# Patient Record
Sex: Male | Born: 1960
Health system: Southern US, Community
[De-identification: ages and names within clinical notes are randomized; demographics above are authoritative.]

## PROBLEM LIST (undated history)

## (undated) DIAGNOSIS — I10 Essential (primary) hypertension: Secondary | ICD-10-CM

## (undated) DIAGNOSIS — T884XXA Failed or difficult intubation, initial encounter: Secondary | ICD-10-CM

## (undated) DIAGNOSIS — T8859XA Other complications of anesthesia, initial encounter: Secondary | ICD-10-CM

## (undated) DIAGNOSIS — E119 Type 2 diabetes mellitus without complications: Secondary | ICD-10-CM

## (undated) DIAGNOSIS — E785 Hyperlipidemia, unspecified: Secondary | ICD-10-CM

## (undated) DIAGNOSIS — G4730 Sleep apnea, unspecified: Secondary | ICD-10-CM

## (undated) DIAGNOSIS — C801 Malignant (primary) neoplasm, unspecified: Secondary | ICD-10-CM

## (undated) DIAGNOSIS — M19012 Primary osteoarthritis, left shoulder: Secondary | ICD-10-CM

## (undated) HISTORY — DX: Type 2 diabetes mellitus without complications: E11.9

## (undated) HISTORY — DX: Essential (primary) hypertension: I10

## (undated) HISTORY — PX: JOINT REPLACEMENT: SHX530

## (undated) HISTORY — PX: SKIN CANCER EXCISION: SHX779

## (undated) HISTORY — DX: Sleep apnea, unspecified: G47.30

## (undated) HISTORY — PX: HERNIA REPAIR: SHX51

## (undated) HISTORY — DX: Hyperlipidemia, unspecified: E78.5

---

## 1998-01-14 ENCOUNTER — Encounter: Payer: Self-pay | Admitting: Emergency Medicine

## 1998-01-14 ENCOUNTER — Emergency Department (HOSPITAL_COMMUNITY): Admission: EM | Admit: 1998-01-14 | Discharge: 1998-01-14 | Payer: Self-pay | Admitting: Emergency Medicine

## 2000-02-17 HISTORY — PX: SHOULDER ARTHROSCOPY: SHX128

## 2001-03-25 ENCOUNTER — Ambulatory Visit (HOSPITAL_COMMUNITY): Admission: RE | Admit: 2001-03-25 | Discharge: 2001-03-25 | Payer: Self-pay | Admitting: Orthopedic Surgery

## 2001-03-25 ENCOUNTER — Encounter: Payer: Self-pay | Admitting: Orthopedic Surgery

## 2002-01-20 ENCOUNTER — Ambulatory Visit (HOSPITAL_COMMUNITY): Admission: RE | Admit: 2002-01-20 | Discharge: 2002-01-20 | Payer: Self-pay | Admitting: Orthopedic Surgery

## 2002-01-25 ENCOUNTER — Observation Stay (HOSPITAL_COMMUNITY): Admission: RE | Admit: 2002-01-25 | Discharge: 2002-01-26 | Payer: Self-pay | Admitting: Orthopedic Surgery

## 2003-03-17 ENCOUNTER — Emergency Department (HOSPITAL_COMMUNITY): Admission: EM | Admit: 2003-03-17 | Discharge: 2003-03-17 | Payer: Self-pay | Admitting: Emergency Medicine

## 2004-12-09 ENCOUNTER — Ambulatory Visit (HOSPITAL_COMMUNITY): Admission: RE | Admit: 2004-12-09 | Discharge: 2004-12-09 | Payer: Self-pay | Admitting: Orthopedic Surgery

## 2005-01-15 ENCOUNTER — Encounter: Admission: RE | Admit: 2005-01-15 | Discharge: 2005-03-10 | Payer: Self-pay | Admitting: Orthopedic Surgery

## 2006-02-16 HISTORY — PX: KNEE ARTHROSCOPY: SHX127

## 2012-08-22 ENCOUNTER — Other Ambulatory Visit: Payer: Self-pay

## 2012-08-22 NOTE — Telephone Encounter (Signed)
lst seen 08/10/11  ACM

## 2012-08-23 MED ORDER — LISINOPRIL 20 MG PO TABS: 20.0000 mg | ORAL_TABLET | Freq: Every day | ORAL | Status: DC

## 2012-08-23 NOTE — Telephone Encounter (Signed)
Needs to be seen

## 2012-08-25 ENCOUNTER — Other Ambulatory Visit: Payer: Self-pay

## 2012-08-25 NOTE — Telephone Encounter (Signed)
Looks like you approved 15 pills on

## 2012-08-29 ENCOUNTER — Other Ambulatory Visit: Payer: Self-pay | Admitting: *Deleted

## 2012-08-29 MED ORDER — AMLODIPINE BESYLATE 10 MG PO TABS: 10.0000 mg | ORAL_TABLET | Freq: Every day | ORAL | Status: DC

## 2012-10-03 ENCOUNTER — Encounter: Payer: Self-pay | Admitting: Family Medicine

## 2012-10-03 ENCOUNTER — Ambulatory Visit (INDEPENDENT_AMBULATORY_CARE_PROVIDER_SITE_OTHER): Payer: Managed Care, Other (non HMO) | Admitting: Family Medicine

## 2012-10-03 VITALS — BP 163/96 | HR 92 | Temp 97.8°F | Wt 298.8 lb

## 2012-10-03 DIAGNOSIS — Z0000 Encounter for general adult medical examination without abnormal findings: Secondary | ICD-10-CM

## 2012-10-03 DIAGNOSIS — I10 Essential (primary) hypertension: Secondary | ICD-10-CM

## 2012-10-03 LAB — POCT CBC
Granulocyte percent: 64.9 %G (ref 37–80)
HCT, POC: 47.2 % (ref 43.5–53.7)
Hemoglobin: 16 g/dL (ref 14.1–18.1)
Lymph, poc: 2.9 (ref 0.6–3.4)
MCH, POC: 28.8 pg (ref 27–31.2)
MCHC: 33.9 g/dL (ref 31.8–35.4)
MCV: 84.9 fL (ref 80–97)
MPV: 8.6 fL (ref 0–99.8)
POC Granulocyte: 5.8 (ref 2–6.9)
POC LYMPH PERCENT: 31.9 %L (ref 10–50)
Platelet Count, POC: 262 10*3/uL (ref 142–424)
RBC: 5.6 M/uL (ref 4.69–6.13)
RDW, POC: 13.2 %
WBC: 9 10*3/uL (ref 4.6–10.2)

## 2012-10-03 MED ORDER — AMLODIPINE BESYLATE 10 MG PO TABS: 10.0000 mg | ORAL_TABLET | Freq: Every day | ORAL | Status: DC

## 2012-10-03 MED ORDER — LISINOPRIL 20 MG PO TABS: 20.0000 mg | ORAL_TABLET | Freq: Every day | ORAL | Status: DC

## 2012-10-03 NOTE — Patient Instructions (Signed)

## 2012-10-03 NOTE — Progress Notes (Signed)
  Subjective:    Patient ID: Gregory Carey, male    DOB: 04/20/1960, 52 y.o.   MRN: [id number]  HPI  This 52 y.o. male presents for evaluation of hypertension and annual exam.  He has been  Seen over a year and is due for labs.  He is not having any acute complaints.  He has been Out of bp medicine for a few days.  Review of Systems    No chest pain, SOB, HA, dizziness, vision change, N/V, diarrhea, constipation, dysuria, urinary urgency or frequency, myalgias, arthralgias or rash.  Objective:   Physical Exam Vital signs noted  Well developed well nourished male.  HEENT - Head atraumatic Normocephalic                Eyes - PERRLA, Conjuctiva - clear Sclera- Clear EOMI                Ears - EAC's Wnl TM's Wnl Gross Hearing WNL                Nose - Nares patent                 Throat - oropharanx wnl Respiratory - Lungs CTA bilateral Cardiac - RRR S1 and S2 without murmur GI - Abdomen soft Nontender and bowel sounds active x 4 Extremities - No edema. Neuro - Grossly intact.      Assessment & Plan:  Essential hypertension, benign - Plan: amLODipine (NORVASC) 10 MG tablet, lisinopril (PRINIVIL,ZESTRIL) 20 MG tablet  Routine general medical examination at a health care facility - Plan: POCT CBC, CMP14+EGFR, TSH, PSA, total and free  Follow up in 6 months

## 2012-10-04 LAB — PSA, TOTAL AND FREE
PSA, Free Pct: 27.5 %
PSA, Free: 0.11 ng/mL
PSA: 0.4 ng/mL (ref 0.0–4.0)

## 2012-10-04 LAB — CMP14+EGFR
ALT: 35 IU/L (ref 0–44)
AST: 28 IU/L (ref 0–40)
Albumin/Globulin Ratio: 1.9 (ref 1.1–2.5)
Albumin: 4.5 g/dL (ref 3.5–5.5)
Alkaline Phosphatase: 75 IU/L (ref 39–117)
BUN/Creatinine Ratio: 17 (ref 9–20)
BUN: 15 mg/dL (ref 6–24)
CO2: 20 mmol/L (ref 18–29)
Calcium: 9.5 mg/dL (ref 8.7–10.2)
Chloride: 104 mmol/L (ref 97–108)
Creatinine, Ser: 0.87 mg/dL (ref 0.76–1.27)
GFR calc Af Amer: 116 mL/min/{1.73_m2} (ref >59)
GFR calc non Af Amer: 100 mL/min/{1.73_m2} (ref >59)
Globulin, Total: 2.4 g/dL (ref 1.5–4.5)
Glucose: 133 mg/dL — ABNORMAL HIGH (ref 65–99)
Potassium: 4 mmol/L (ref 3.5–5.2)
Sodium: 142 mmol/L (ref 134–144)
Total Bilirubin: 0.4 mg/dL (ref 0.0–1.2)
Total Protein: 6.9 g/dL (ref 6.0–8.5)

## 2012-10-04 LAB — SPECIMEN STATUS REPORT

## 2012-10-04 LAB — TSH: TSH: 1.03 u[IU]/mL (ref 0.450–4.500)

## 2012-10-07 LAB — POCT GLYCOSYLATED HEMOGLOBIN (HGB A1C): Hemoglobin A1C: 5.7

## 2012-10-07 NOTE — Addendum Note (Signed)
Addended by: Lisbeth Ply C on: 10/07/2012 09:05 AM   Modules accepted: Orders

## 2013-01-25 ENCOUNTER — Ambulatory Visit (INDEPENDENT_AMBULATORY_CARE_PROVIDER_SITE_OTHER): Payer: Managed Care, Other (non HMO) | Admitting: Family Medicine

## 2013-01-25 ENCOUNTER — Encounter: Payer: Self-pay | Admitting: Family Medicine

## 2013-01-25 VITALS — BP 141/88 | HR 85 | Temp 99.0°F

## 2013-01-25 DIAGNOSIS — J209 Acute bronchitis, unspecified: Secondary | ICD-10-CM

## 2013-01-25 MED ORDER — AMOXICILLIN 875 MG PO TABS: 875.0000 mg | ORAL_TABLET | Freq: Two times a day (BID) | ORAL | Status: DC

## 2013-01-25 MED ORDER — METHYLPREDNISOLONE (PAK) 4 MG PO TABS: ORAL_TABLET | ORAL | Status: DC

## 2013-01-25 MED ORDER — ALBUTEROL SULFATE HFA 108 (90 BASE) MCG/ACT IN AERS: 2.0000 | INHALATION_SPRAY | Freq: Four times a day (QID) | RESPIRATORY_TRACT | Status: DC | PRN

## 2013-01-25 MED ORDER — HYDROCODONE-HOMATROPINE 5-1.5 MG/5ML PO SYRP: 5.0000 mL | ORAL_SOLUTION | Freq: Four times a day (QID) | ORAL | Status: DC | PRN

## 2013-01-25 NOTE — Patient Instructions (Signed)

## 2013-01-25 NOTE — Progress Notes (Signed)
   Subjective:    Patient ID: Gregory Carey, male    DOB: 11/05/1960, 52 y.o.   MRN: [id number]  HPI This 52 y.o. male presents for evaluation of cough and uri sx's for over a week. He is having persistent cough and cannot sleep at night.  Review of Systems C/o wheezing and URI sx's. No chest pain, SOB, HA, dizziness, vision change, N/V, diarrhea, constipation, dysuria, urinary urgency or frequency, myalgias, arthralgias or rash.     Objective:   Physical Exam  Vital signs noted  Well developed well nourished male.  HEENT - Head atraumatic Normocephalic                Eyes - PERRLA, Conjuctiva - clear Sclera- Clear EOMI                Ears - EAC's Wnl TM's Wnl Gross Hearing WNL                Nose - Nares patent                 Throat - oropharanx wnl Respiratory - Lungs diminished throughout Cardiac - RRR S1 and S2 without murmur GI - Abdomen soft Nontender and bowel sounds active x 4 Extremities - No edema. Neuro - Grossly intact.      Assessment & Plan:  Acute bronchitis - Plan: amoxicillin (AMOXIL) 875 MG tablet, methylPREDNIsolone (MEDROL DOSPACK) 4 MG tablet, HYDROcodone-homatropine (HYCODAN) 5-1.5 MG/5ML syrup, albuterol (PROVENTIL HFA;VENTOLIN HFA) 108 (90 BASE) MCG/ACT inhaler  Push po fluids, rest, tylenol and motrin otc prn as directed for fever, arthralgias, and myalgias.  Follow up prn if sx's continue or persist.  Deatra Canter FNP

## 2013-04-05 ENCOUNTER — Ambulatory Visit: Payer: BC Managed Care – PPO | Admitting: Family Medicine

## 2013-10-06 ENCOUNTER — Encounter: Payer: Self-pay | Admitting: Family

## 2013-10-06 ENCOUNTER — Other Ambulatory Visit: Payer: Self-pay | Admitting: Family Medicine

## 2013-10-06 ENCOUNTER — Ambulatory Visit (INDEPENDENT_AMBULATORY_CARE_PROVIDER_SITE_OTHER): Payer: BC Managed Care – PPO | Admitting: Family

## 2013-10-06 VITALS — BP 147/86 | HR 77 | Temp 97.1°F | Ht 71.0 in | Wt 300.8 lb

## 2013-10-06 DIAGNOSIS — J069 Acute upper respiratory infection, unspecified: Secondary | ICD-10-CM

## 2013-10-06 MED ORDER — AZITHROMYCIN 250 MG PO TABS: ORAL_TABLET | ORAL | Status: DC

## 2013-10-06 NOTE — Patient Instructions (Addendum)
Upper Respiratory Infection, Adult An upper respiratory infection (URI) is also known as the common cold. It is often caused by a type of germ (virus). Colds are easily spread (contagious). You can pass it to others by kissing, coughing, sneezing, or drinking out of the same glass. Usually, you get better in 1 or 2 weeks.  HOME CARE   Only take medicine as told by your doctor.  Use a warm mist humidifier or breathe in steam from a hot shower.  Drink enough water and fluids to keep your pee (urine) clear or pale yellow.  Get plenty of rest.  Return to work when your temperature is back to normal or as told by your doctor. You may use a face mask and wash your hands to stop your cold from spreading. GET HELP RIGHT AWAY IF:   After the first few days, you feel you are getting worse.  You have questions about your medicine.  You have chills, shortness of breath, or brown or red spit (mucus).  You have yellow or brown snot (nasal discharge) or pain in the face, especially when you bend forward.  You have a fever, puffy (swollen) neck, pain when you swallow, or white spots in the back of your throat.  You have a bad headache, ear pain, sinus pain, or chest pain.  You have a high-pitched whistling sound when you breathe in and out (wheezing).  You have a lasting cough or cough up blood.  You have sore muscles or a stiff neck. MAKE SURE YOU:   Understand these instructions.  Will watch your condition.  Will get help right away if you are not doing well or get worse. Document Released: 07/22/2007 Document Revised: 04/27/2011 Document Reviewed: 05/10/2013 [facility] Patient Information 2015 [place], [place]. This information is not intended to replace advice given to you by your health care provider. Make sure you discuss any questions you have with your health care provider.  - Take meds as prescribed - Use a cool mist humidifier  -Use saline nose sprays frequently -Saline  irrigations of the nose can be very helpful if done frequently.  * 4X daily for 1 week*  * Use of a nettie pot can be helpful with this. Follow directions with this* -Force fluids -For any cough or congestion  Use plain Mucinex- regular strength or max strength is fine   * Children- consult with Pharmacist for dosing -For fever or aces or pains- take tylenol or ibuprofen appropriate for age and weight.  * for fevers greater than 101 orally you may alternate ibuprofen and tylenol every  3 hours. -Throat lozenges if helps   Evelina Dun, FNP

## 2013-10-06 NOTE — Progress Notes (Signed)
Subjective:    Patient ID: Gregory Carey, male    DOB: 13-Dec-1960, 53 y.o.   MRN: [id number]  Sore Throat  This is a new problem. The current episode started in the past 7 days. The problem has been waxing and waning. Neither side of throat is experiencing more pain than the other. There has been no fever. The pain is at a severity of 2/10. The pain is mild. Associated symptoms include congestion, coughing, a hoarse voice, a plugged ear sensation, neck pain and trouble swallowing. Pertinent negatives include no abdominal pain, diarrhea, ear discharge, ear pain, headaches, shortness of breath or vomiting. He has had no exposure to strep. He has tried acetaminophen and NSAIDs for the symptoms. The treatment provided mild relief.      Review of Systems  Constitutional: Negative.   HENT: Positive for congestion, hoarse voice and trouble swallowing. Negative for ear discharge and ear pain.   Respiratory: Positive for cough. Negative for shortness of breath.   Cardiovascular: Negative.   Gastrointestinal: Negative.  Negative for vomiting, abdominal pain and diarrhea.  Endocrine: Negative.   Genitourinary: Negative.   Musculoskeletal: Positive for neck pain.  Neurological: Negative.  Negative for headaches.  Hematological: Negative.   Psychiatric/Behavioral: Negative.   All other systems reviewed and are negative.      Objective:   Physical Exam  Vitals reviewed. Constitutional: He is oriented to person, place, and time. He appears well-developed and well-nourished. No distress.  HENT:  Head: Normocephalic.  Right Ear: External ear normal.  Left Ear: External ear normal.  Nasal passage erythemas with mild swelling Oropharynx erythemas   Eyes: Pupils are equal, round, and reactive to light. Right eye exhibits no discharge. Left eye exhibits no discharge.  Neck: Normal range of motion. Neck supple. No thyromegaly present.  Cardiovascular: Normal rate, regular rhythm, normal heart  sounds and intact distal pulses.   No murmur heard. Pulmonary/Chest: Effort normal and breath sounds normal. No respiratory distress. He has no wheezes.  Abdominal: Soft. Bowel sounds are normal. He exhibits no distension. There is no tenderness.  Musculoskeletal: Normal range of motion. He exhibits no edema and no tenderness.  Neurological: He is alert and oriented to person, place, and time. He has normal reflexes. No cranial nerve deficit.  Skin: Skin is warm and dry. No rash noted. No erythema.  Psychiatric: He has a normal mood and affect. His behavior is normal. Judgment and thought content normal.      BP 147/86  Pulse 77  Temp(Src) 97.1 F (36.2 C) (Oral)  Ht 5\' 11"  (1.803 m)  Wt 300 lb 12.8 oz (136.442 kg)  BMI 41.97 kg/m2     Assessment & Plan:  1. URI (upper respiratory infection) -- Take meds as prescribed - Use a cool mist humidifier  -Use saline nose sprays frequently -Saline irrigations of the nose can be very helpful if done frequently.  * 4X daily for 1 week*  * Use of a nettie pot can be helpful with this. Follow directions with this* -Force fluids -For any cough or congestion  Use plain Mucinex- regular strength or max strength is fine   * Children- consult with Pharmacist for dosing -For fever or aces or pains- take tylenol or ibuprofen appropriate for age and weight.  * for fevers greater than 101 orally you may alternate ibuprofen and tylenol every  3 hours. -Throat lozenges if help - azithromycin (ZITHROMAX) 250 MG tablet; Take 500 mg first day and then 250 mg  for 4 days  Dispense: 6 tablet; Refill: 0  Evelina Dun, FNP

## 2013-10-09 NOTE — Telephone Encounter (Signed)
Last seen for URI 10/06/13, last seen for HTN 10/03/12

## 2014-01-16 ENCOUNTER — Telehealth: Payer: Self-pay | Admitting: Family Medicine

## 2014-01-16 MED ORDER — LISINOPRIL 20 MG PO TABS: 20.0000 mg | ORAL_TABLET | Freq: Every day | ORAL | Status: DC

## 2014-01-16 MED ORDER — AMLODIPINE BESYLATE 10 MG PO TABS: ORAL_TABLET | ORAL | Status: DC

## 2014-01-16 NOTE — Telephone Encounter (Signed)
Appointment given for o12/8 with Dietrich Pates, FNP

## 2014-01-23 ENCOUNTER — Ambulatory Visit (INDEPENDENT_AMBULATORY_CARE_PROVIDER_SITE_OTHER): Payer: BC Managed Care – PPO | Admitting: Family Medicine

## 2014-01-23 ENCOUNTER — Encounter: Payer: Self-pay | Admitting: Family Medicine

## 2014-01-23 VITALS — BP 156/91 | HR 81 | Temp 97.6°F | Ht 71.0 in | Wt 294.2 lb

## 2014-01-23 DIAGNOSIS — R5383 Other fatigue: Secondary | ICD-10-CM

## 2014-01-23 DIAGNOSIS — I10 Essential (primary) hypertension: Secondary | ICD-10-CM

## 2014-01-23 DIAGNOSIS — Z139 Encounter for screening, unspecified: Secondary | ICD-10-CM

## 2014-01-23 LAB — POCT CBC
Granulocyte percent: 66 %G (ref 37–80)
HCT, POC: 45.7 % (ref 43.5–53.7)
Hemoglobin: 15.4 g/dL (ref 14.1–18.1)
Lymph, poc: 2.4 (ref 0.6–3.4)
MCH, POC: 29.1 pg (ref 27–31.2)
MCHC: 33.8 g/dL (ref 31.8–35.4)
MCV: 86.3 fL (ref 80–97)
MPV: 8.5 fL (ref 0–99.8)
POC Granulocyte: 5.1 (ref 2–6.9)
POC LYMPH PERCENT: 31.5 %L (ref 10–50)
Platelet Count, POC: 261 10*3/uL (ref 142–424)
RBC: 5.3 M/uL (ref 4.69–6.13)
RDW, POC: 13.5 %
WBC: 7.7 10*3/uL (ref 4.6–10.2)

## 2014-01-23 MED ORDER — LISINOPRIL 20 MG PO TABS: 20.0000 mg | ORAL_TABLET | Freq: Every day | ORAL | Status: DC

## 2014-01-23 MED ORDER — AMLODIPINE BESYLATE 10 MG PO TABS: ORAL_TABLET | ORAL | Status: DC

## 2014-01-23 NOTE — Progress Notes (Signed)
Subjective:    Patient ID: Gregory Carey, male    DOB: 07/17/1960, 53 y.o.   MRN: [id number]  HPI Patient is here for follow up.  He has hx of hypertension.  He is due for CPE labs.  Review of Systems  Constitutional: Negative for fever.  HENT: Negative for ear pain.   Eyes: Negative for discharge.  Respiratory: Negative for cough.   Cardiovascular: Negative for chest pain.  Gastrointestinal: Negative for abdominal distention.  Endocrine: Negative for polyuria.  Genitourinary: Negative for difficulty urinating.  Musculoskeletal: Negative for gait problem and neck pain.  Skin: Negative for color change and rash.  Neurological: Negative for speech difficulty and headaches.  Psychiatric/Behavioral: Negative for agitation.       Objective:    BP 156/91 mmHg  Pulse 81  Temp(Src) 97.6 F (36.4 C) (Oral)  Ht 5' 11" (1.803 m)  Wt 294 lb 3.2 oz (133.448 kg)  BMI 41.05 kg/m2 Physical Exam  Constitutional: He is oriented to person, place, and time. He appears well-developed and well-nourished.  HENT:  Head: Normocephalic and atraumatic.  Mouth/Throat: Oropharynx is clear and moist.  Eyes: Pupils are equal, round, and reactive to light.  Neck: Normal range of motion. Neck supple.  Cardiovascular: Normal rate and regular rhythm.   No murmur heard. Pulmonary/Chest: Effort normal and breath sounds normal.  Abdominal: Soft. Bowel sounds are normal. There is no tenderness.  Neurological: He is alert and oriented to person, place, and time.  Skin: Skin is warm and dry.  Psychiatric: He has a normal mood and affect.          Assessment & Plan:     ICD-9-CM ICD-10-CM   1. Screening V82.9 Z13.9 Lipid panel     PSA, total and free     Thyroid Panel With TSH     lisinopril (PRINIVIL,ZESTRIL) 20 MG tablet     amLODipine (NORVASC) 10 MG tablet  2. Other fatigue 780.79 R53.83 POCT CBC     PSA, total and free     Thyroid Panel With TSH     lisinopril (PRINIVIL,ZESTRIL) 20 MG  tablet     amLODipine (NORVASC) 10 MG tablet  3. Essential hypertension, benign 401.1 I10 POCT CBC     CMP14+EGFR     Lipid panel     lisinopril (PRINIVIL,ZESTRIL) 20 MG tablet     amLODipine (NORVASC) 10 MG tablet     No Follow-up on file.  William J Oxford FNP  

## 2014-01-24 ENCOUNTER — Telehealth: Payer: Self-pay

## 2014-01-24 ENCOUNTER — Other Ambulatory Visit: Payer: Self-pay | Admitting: Family Medicine

## 2014-01-24 LAB — CMP14+EGFR
ALT: 39 IU/L (ref 0–44)
AST: 27 IU/L (ref 0–40)
Albumin/Globulin Ratio: 1.9 (ref 1.1–2.5)
Albumin: 4.4 g/dL (ref 3.5–5.5)
Alkaline Phosphatase: 79 IU/L (ref 39–117)
BUN/Creatinine Ratio: 11 (ref 9–20)
BUN: 10 mg/dL (ref 6–24)
CO2: 24 mmol/L (ref 18–29)
Calcium: 9.3 mg/dL (ref 8.7–10.2)
Chloride: 100 mmol/L (ref 97–108)
Creatinine, Ser: 0.88 mg/dL (ref 0.76–1.27)
GFR calc Af Amer: 114 mL/min/{1.73_m2} (ref >59)
GFR calc non Af Amer: 99 mL/min/{1.73_m2} (ref >59)
Globulin, Total: 2.3 g/dL (ref 1.5–4.5)
Glucose: 107 mg/dL — ABNORMAL HIGH (ref 65–99)
Potassium: 4.1 mmol/L (ref 3.5–5.2)
Sodium: 140 mmol/L (ref 134–144)
Total Bilirubin: 0.6 mg/dL (ref 0.0–1.2)
Total Protein: 6.7 g/dL (ref 6.0–8.5)

## 2014-01-24 LAB — LIPID PANEL
Chol/HDL Ratio: 5.8 ratio units — ABNORMAL HIGH (ref 0.0–5.0)
Cholesterol, Total: 186 mg/dL (ref 100–199)
HDL: 32 mg/dL — ABNORMAL LOW (ref >39)
LDL Calculated: 123 mg/dL — ABNORMAL HIGH (ref 0–99)
Triglycerides: 157 mg/dL — ABNORMAL HIGH (ref 0–149)
VLDL Cholesterol Cal: 31 mg/dL (ref 5–40)

## 2014-01-24 LAB — PSA, TOTAL AND FREE
PSA, Free Pct: 24 %
PSA, Free: 0.12 ng/mL
PSA: 0.5 ng/mL (ref 0.0–4.0)

## 2014-01-24 LAB — THYROID PANEL WITH TSH
Free Thyroxine Index: 1.8 (ref 1.2–4.9)
T3 Uptake Ratio: 32 % (ref 24–39)
T4, Total: 5.7 ug/dL (ref 4.5–12.0)
TSH: 0.705 u[IU]/mL (ref 0.450–4.500)

## 2014-01-24 NOTE — Telephone Encounter (Signed)
-----   Message from Lysbeth Penner, FNP sent at 01/24/2014  9:59 AM EST ----- Lipids elevated and take omega 3 fish oil otc

## 2014-01-24 NOTE — Telephone Encounter (Signed)
Left results on home am as per DPR of 09/2012

## 2014-02-22 ENCOUNTER — Telehealth: Payer: Self-pay | Admitting: Family Medicine

## 2014-02-22 NOTE — Telephone Encounter (Signed)
Pt notified RXs sent into [facility]

## 2014-09-25 ENCOUNTER — Encounter: Payer: Self-pay | Admitting: Physician Assistant

## 2014-09-25 ENCOUNTER — Ambulatory Visit (INDEPENDENT_AMBULATORY_CARE_PROVIDER_SITE_OTHER): Payer: BLUE CROSS/BLUE SHIELD | Admitting: Physician Assistant

## 2014-09-25 ENCOUNTER — Ambulatory Visit (INDEPENDENT_AMBULATORY_CARE_PROVIDER_SITE_OTHER): Payer: BLUE CROSS/BLUE SHIELD

## 2014-09-25 VITALS — BP 148/91 | HR 84 | Temp 98.3°F | Ht 71.0 in | Wt 294.0 lb

## 2014-09-25 DIAGNOSIS — M25511 Pain in right shoulder: Secondary | ICD-10-CM | POA: Diagnosis not present

## 2014-09-25 MED ORDER — PREDNISONE 10 MG (21) PO TBPK: ORAL_TABLET | ORAL | Status: DC

## 2014-09-25 MED ORDER — HYDROCODONE-ACETAMINOPHEN 5-325 MG PO TABS: 1.0000 | ORAL_TABLET | Freq: Four times a day (QID) | ORAL | Status: DC | PRN

## 2014-09-25 MED ORDER — MELOXICAM 15 MG PO TABS: 15.0000 mg | ORAL_TABLET | Freq: Every day | ORAL | Status: DC

## 2014-09-25 NOTE — Progress Notes (Signed)
   Subjective:    Patient ID: Gregory Carey, male    DOB: [birth year], 54 y.o.   MRN: [id number]  HPI 17 y/lo male presents with right shoulder pain x 1 week. Waking him up at night. Denies trauma but has had a dislocation 15 years ago. [place]. Reaches a lot during his job. Had arthroscopy on right shoulder in 18 years.   Pain started gradually, worsening. Has tried Aleve ( 2 pills q 6-8 hours) Aspercream, hot and cold with no relief. Worse with lying down in the evening hours. Pain radiates from shoulder to forearm. Denies numbness or tingling   Also states that he has had problems with it over the years off and on.     Review of Systems  Musculoskeletal: Positive for arthralgias (right shoulder, constant but intensity varies ).  Neurological: Negative.   All other systems reviewed and are negative.      Objective:   Physical Exam  Constitutional: He is oriented to person, place, and time. He appears well-developed and well-nourished. No distress.  Musculoskeletal: He exhibits tenderness. He exhibits no edema.  Neurological: He is alert and oriented to person, place, and time. No cranial nerve deficit. Coordination normal.  Skin: He is not diaphoretic. No erythema.  Psychiatric: He has a normal mood and affect. His behavior is normal. Judgment and thought content normal.  Vitals reviewed.         Assessment & Plan:  1. Pain in joint, shoulder region, right  - DG Shoulder Right; Future - predniSONE (STERAPRED UNI-PAK 21 TAB) 10 MG (21) TBPK tablet; 6 pills PO on day 1, 5 on day 2, 4 on day 3, 3 on day 4, 2 on day 5, 1 on day 6  Dispense: 21 tablet; Refill: 0 - meloxicam (MOBIC) 15 MG tablet; Take 1 tablet (15 mg total) by mouth daily.  Dispense: 30 tablet; Refill: 0 - HYDROcodone-acetaminophen (NORCO) 5-325 MG per tablet; Take 1-2 tablets by mouth every 6 (six) hours as needed for moderate pain.  Dispense: 90 tablet; Refill: 0 -  Ambulatory referral to Orthopedic Surgery     Mckenlee Mangham A. Benjamin Stain PA-C

## 2014-09-25 NOTE — Patient Instructions (Signed)
Shoulder Pain  The shoulder is the joint that connects your arm to your body. Muscles and band-like tissues that connect bones to muscles (tendons) hold the joint together. Shoulder pain is felt if an injury or medical problem affects one or more parts of the shoulder.  HOME CARE   · Put ice on the sore area.  ¨ Put ice in a plastic bag.  ¨ Place a towel between your skin and the bag.  ¨ Leave the ice on for 15-20 minutes, 03-04 times a day for the first 2 days.  · Stop using cold packs if they do not help with the pain.  · If you were given something to keep your shoulder from moving (sling; shoulder immobilizer), wear it as told. Only take it off to shower or bathe.  · Move your arm as little as possible, but keep your hand moving to prevent puffiness (swelling).  · Squeeze a soft ball or foam pad as much as possible to help prevent swelling.  · Take medicine as told by your doctor.  GET HELP IF:  · You have progressing new pain in your arm, hand, or fingers.  · Your hand or fingers get cold.  · Your medicine does not help lessen your pain.  GET HELP RIGHT AWAY IF:   · Your arm, hand, or fingers are numb or tingling.  · Your arm, hand, or fingers are puffy (swollen), painful, or turn white or blue.  MAKE SURE YOU:   · Understand these instructions.  · Will watch your condition.  · Will get help right away if you are not doing well or get worse.  Document Released: 07/22/2007 Document Revised: 06/19/2013 Document Reviewed: 08/17/2011  [employer]® Patient Information ©2015 [employer]. This information is not intended to replace advice given to you by your health care provider. Make sure you discuss any questions you have with your health care provider.

## 2014-09-26 ENCOUNTER — Ambulatory Visit: Payer: Self-pay | Admitting: Physician Assistant

## 2015-01-25 ENCOUNTER — Other Ambulatory Visit: Payer: Self-pay

## 2015-01-25 DIAGNOSIS — Z139 Encounter for screening, unspecified: Secondary | ICD-10-CM

## 2015-01-25 DIAGNOSIS — R5383 Other fatigue: Secondary | ICD-10-CM

## 2015-01-25 DIAGNOSIS — I10 Essential (primary) hypertension: Secondary | ICD-10-CM

## 2015-01-25 MED ORDER — LISINOPRIL 20 MG PO TABS: 20.0000 mg | ORAL_TABLET | Freq: Every day | ORAL | Status: DC

## 2015-01-25 MED ORDER — AMLODIPINE BESYLATE 10 MG PO TABS: ORAL_TABLET | ORAL | Status: DC

## 2015-04-18 ENCOUNTER — Other Ambulatory Visit: Payer: Self-pay | Admitting: Family Medicine

## 2015-04-18 NOTE — Telephone Encounter (Signed)
Pt Patient NTBS for follow up and lab work

## 2015-04-18 NOTE — Telephone Encounter (Signed)
Last seen 09/25/14 [facility] Dense PCP

## 2015-04-18 NOTE — Telephone Encounter (Signed)
Pt has appt already scheduled for next week

## 2015-04-29 ENCOUNTER — Encounter: Payer: Self-pay | Admitting: Family

## 2015-04-29 ENCOUNTER — Ambulatory Visit (INDEPENDENT_AMBULATORY_CARE_PROVIDER_SITE_OTHER): Payer: BLUE CROSS/BLUE SHIELD | Admitting: Family

## 2015-04-29 VITALS — BP 133/80 | HR 95 | Temp 97.8°F | Ht 71.0 in | Wt 300.0 lb

## 2015-04-29 DIAGNOSIS — I10 Essential (primary) hypertension: Secondary | ICD-10-CM | POA: Diagnosis not present

## 2015-04-29 DIAGNOSIS — E669 Obesity, unspecified: Secondary | ICD-10-CM | POA: Insufficient documentation

## 2015-04-29 DIAGNOSIS — Z6841 Body Mass Index (BMI) 40.0 and over, adult: Secondary | ICD-10-CM | POA: Diagnosis not present

## 2015-04-29 DIAGNOSIS — E785 Hyperlipidemia, unspecified: Secondary | ICD-10-CM

## 2015-04-29 DIAGNOSIS — Z1211 Encounter for screening for malignant neoplasm of colon: Secondary | ICD-10-CM

## 2015-04-29 DIAGNOSIS — Z125 Encounter for screening for malignant neoplasm of prostate: Secondary | ICD-10-CM | POA: Diagnosis not present

## 2015-04-29 DIAGNOSIS — Z23 Encounter for immunization: Secondary | ICD-10-CM

## 2015-04-29 DIAGNOSIS — Z1159 Encounter for screening for other viral diseases: Secondary | ICD-10-CM | POA: Diagnosis not present

## 2015-04-29 DIAGNOSIS — Z114 Encounter for screening for human immunodeficiency virus [HIV]: Secondary | ICD-10-CM

## 2015-04-29 MED ORDER — LISINOPRIL 20 MG PO TABS: 20.0000 mg | ORAL_TABLET | Freq: Every day | ORAL | Status: DC

## 2015-04-29 MED ORDER — AMLODIPINE BESYLATE 10 MG PO TABS: 10.0000 mg | ORAL_TABLET | Freq: Every day | ORAL | Status: DC

## 2015-04-29 NOTE — Addendum Note (Signed)
Addended by: Shelbie Ammons on: 04/29/2015 04:36 PM   Modules accepted: Orders

## 2015-04-29 NOTE — Progress Notes (Signed)
Subjective:    Patient ID: Gregory Carey, male    DOB: January 09, 1961, 55 y.o.   MRN: [id number]  Diarrhea  Pertinent negatives include no headaches.  Hypertension This is a chronic problem. The current episode started more than 1 year ago. The problem has been resolved since onset. The problem is controlled. Pertinent negatives include no anxiety, blurred vision, headaches, palpitations, peripheral edema or shortness of breath. Risk factors for coronary artery disease include dyslipidemia, male gender, obesity and sedentary lifestyle. Past treatments include ACE inhibitors and calcium channel blockers. The current treatment provides moderate improvement. There is no history of kidney disease, CAD/MI, heart failure or a thyroid problem. There is no history of sleep apnea.  Hyperlipidemia This is a chronic problem. The current episode started more than 1 year ago. The problem is uncontrolled. Recent lipid tests were reviewed and are high. Factors aggravating his hyperlipidemia include fatty foods. Pertinent negatives include no shortness of breath. Current antihyperlipidemic treatment includes diet change and herbal therapy. The current treatment provides moderate improvement of lipids. Risk factors for coronary artery disease include dyslipidemia, hypertension, male sex, obesity and a sedentary lifestyle.      Review of Systems  Constitutional: Negative.   HENT: Negative.   Eyes: Negative for blurred vision.  Respiratory: Negative.  Negative for shortness of breath.   Cardiovascular: Negative.  Negative for palpitations.  Gastrointestinal: Positive for diarrhea.  Endocrine: Negative.   Genitourinary: Negative.   Musculoskeletal: Negative.   Neurological: Negative.  Negative for headaches.  Hematological: Negative.   Psychiatric/Behavioral: Negative.   All other systems reviewed and are negative.      Objective:   Physical Exam  Constitutional: He is oriented to person, place, and  time. He appears well-developed and well-nourished. No distress.  Morbid obese   HENT:  Head: Normocephalic.  Right Ear: External ear normal.  Left Ear: External ear normal.  Mouth/Throat: Oropharynx is clear and moist.  Eyes: Pupils are equal, round, and reactive to light. Right eye exhibits no discharge. Left eye exhibits no discharge.  Neck: Normal range of motion. Neck supple. No thyromegaly present.  Cardiovascular: Normal rate, regular rhythm, normal heart sounds and intact distal pulses.   No murmur heard. Pulmonary/Chest: Effort normal and breath sounds normal. No respiratory distress. He has no wheezes.  Abdominal: Soft. Bowel sounds are normal. He exhibits no distension. There is no tenderness.  Musculoskeletal: Normal range of motion. He exhibits no edema or tenderness.  Neurological: He is alert and oriented to person, place, and time. He has normal reflexes. No cranial nerve deficit.  Skin: Skin is warm and dry. No rash noted. No erythema.  Psychiatric: He has a normal mood and affect. His behavior is normal. Judgment and thought content normal.  Vitals reviewed.   BP 133/80 mmHg  Pulse 95  Temp(Src) 97.8 F (36.6 C) (Oral)  Ht 5' 11"  (1.803 m)  Wt 300 lb (136.079 kg)  BMI 41.86 kg/m2       Assessment & Plan:  1. Essential hypertension - CMP14+EGFR - lisinopril (PRINIVIL,ZESTRIL) 20 MG tablet; Take 1 tablet (20 mg total) by mouth daily.  Dispense: 90 tablet; Refill: 1 - amLODipine (NORVASC) 10 MG tablet; Take 1 tablet (10 mg total) by mouth daily.  Dispense: 90 tablet; Refill: 1  2. Hyperlipidemia - CMP14+EGFR - Lipid panel  3. Morbid obesity with BMI of 40.0-44.9, adult ([place]) - CMP14+EGFR  4. Need for hepatitis C screening test - CMP14+EGFR - Hepatitis C antibody  5. Encounter  for screening for HIV - CMP14+EGFR - HIV antibody  6. Colon cancer screening - CMP14+EGFR - Fecal occult blood, imunochemical; Future  7. Prostate cancer screening -  CMP14+EGFR - PSA, total and free   Continue all meds Labs pending Health Maintenance reviewed- TDAP given today Diet and exercise encouraged RTO 6 months  Evelina Dun, FNP

## 2015-04-29 NOTE — Patient Instructions (Signed)

## 2015-04-30 LAB — LIPID PANEL
CHOL/HDL RATIO: 6.3 ratio — AB (ref 0.0–5.0)
Cholesterol, Total: 169 mg/dL (ref 100–199)
HDL: 27 mg/dL — AB (ref >39)
LDL Calculated: 95 mg/dL (ref 0–99)
Triglycerides: 236 mg/dL — ABNORMAL HIGH (ref 0–149)
VLDL Cholesterol Cal: 47 mg/dL — ABNORMAL HIGH (ref 5–40)

## 2015-04-30 LAB — CMP14+EGFR
A/G RATIO: 2.3 — AB (ref 1.2–2.2)
ALBUMIN: 4.5 g/dL (ref 3.5–5.5)
ALT: 97 IU/L — ABNORMAL HIGH (ref 0–44)
AST: 46 IU/L — ABNORMAL HIGH (ref 0–40)
Alkaline Phosphatase: 99 IU/L (ref 39–117)
BUN/Creatinine Ratio: 16 (ref 9–20)
BUN: 20 mg/dL (ref 6–24)
Bilirubin Total: 0.6 mg/dL (ref 0.0–1.2)
CALCIUM: 9.7 mg/dL (ref 8.7–10.2)
CO2: 24 mmol/L (ref 18–29)
Chloride: 97 mmol/L (ref 96–106)
Creatinine, Ser: 1.28 mg/dL — ABNORMAL HIGH (ref 0.76–1.27)
GFR, EST AFRICAN AMERICAN: 73 mL/min/{1.73_m2} (ref >59)
GFR, EST NON AFRICAN AMERICAN: 63 mL/min/{1.73_m2} (ref >59)
GLOBULIN, TOTAL: 2 g/dL (ref 1.5–4.5)
Glucose: 180 mg/dL — ABNORMAL HIGH (ref 65–99)
POTASSIUM: 4.6 mmol/L (ref 3.5–5.2)
SODIUM: 140 mmol/L (ref 134–144)
TOTAL PROTEIN: 6.5 g/dL (ref 6.0–8.5)

## 2015-04-30 LAB — PSA, TOTAL AND FREE
PROSTATE SPECIFIC AG, SERUM: 0.4 ng/mL (ref 0.0–4.0)
PSA, Free Pct: 27.5 %
PSA, Free: 0.11 ng/mL

## 2015-04-30 LAB — HIV ANTIBODY (ROUTINE TESTING W REFLEX): HIV Screen 4th Generation wRfx: NONREACTIVE

## 2015-04-30 LAB — HEPATITIS C ANTIBODY: HEP C VIRUS AB: 0.2 {s_co_ratio} (ref 0.0–0.9)

## 2015-07-18 DIAGNOSIS — Z85828 Personal history of other malignant neoplasm of skin: Secondary | ICD-10-CM | POA: Diagnosis not present

## 2015-07-18 DIAGNOSIS — L821 Other seborrheic keratosis: Secondary | ICD-10-CM | POA: Diagnosis not present

## 2015-07-18 DIAGNOSIS — L570 Actinic keratosis: Secondary | ICD-10-CM | POA: Diagnosis not present

## 2015-07-18 DIAGNOSIS — D1801 Hemangioma of skin and subcutaneous tissue: Secondary | ICD-10-CM | POA: Diagnosis not present

## 2015-07-18 DIAGNOSIS — L812 Freckles: Secondary | ICD-10-CM | POA: Diagnosis not present

## 2015-07-18 DIAGNOSIS — C44612 Basal cell carcinoma of skin of right upper limb, including shoulder: Secondary | ICD-10-CM | POA: Diagnosis not present

## 2015-07-18 DIAGNOSIS — D485 Neoplasm of uncertain behavior of skin: Secondary | ICD-10-CM | POA: Diagnosis not present

## 2015-07-29 ENCOUNTER — Encounter: Payer: Self-pay | Admitting: Family

## 2015-07-29 ENCOUNTER — Ambulatory Visit (INDEPENDENT_AMBULATORY_CARE_PROVIDER_SITE_OTHER): Payer: BLUE CROSS/BLUE SHIELD | Admitting: Family

## 2015-07-29 VITALS — BP 131/90 | HR 88 | Temp 98.4°F | Ht 71.0 in | Wt 266.6 lb

## 2015-07-29 DIAGNOSIS — E1165 Type 2 diabetes mellitus with hyperglycemia: Secondary | ICD-10-CM | POA: Diagnosis not present

## 2015-07-29 DIAGNOSIS — Z6841 Body Mass Index (BMI) 40.0 and over, adult: Secondary | ICD-10-CM | POA: Diagnosis not present

## 2015-07-29 DIAGNOSIS — E785 Hyperlipidemia, unspecified: Secondary | ICD-10-CM

## 2015-07-29 DIAGNOSIS — I10 Essential (primary) hypertension: Secondary | ICD-10-CM

## 2015-07-29 DIAGNOSIS — Z1211 Encounter for screening for malignant neoplasm of colon: Secondary | ICD-10-CM

## 2015-07-29 DIAGNOSIS — E119 Type 2 diabetes mellitus without complications: Secondary | ICD-10-CM | POA: Insufficient documentation

## 2015-07-29 LAB — BAYER DCA HB A1C WAIVED

## 2015-07-29 MED ORDER — BLOOD GLUCOSE METER KIT: PACK | Status: AC

## 2015-07-29 MED ORDER — METFORMIN HCL 1000 MG PO TABS: 1000.0000 mg | ORAL_TABLET | Freq: Two times a day (BID) | ORAL | Status: DC

## 2015-07-29 MED ORDER — GLUCOSE BLOOD VI STRP
ORAL_STRIP | Status: DC
Start: 2015-07-29 — End: 2015-09-09

## 2015-07-29 NOTE — Progress Notes (Signed)
Subjective:    Patient ID: Gregory Carey, male    DOB: 1960-08-23, 55 y.o.   MRN: [id number]  Hyperglycemia This is a new problem. The current episode started more than 1 month ago. The problem occurs constantly. The problem has been rapidly worsening. Associated symptoms include fatigue. Pertinent negatives include no chills, coughing, headaches, nausea, sore throat, urinary symptoms or visual change. He has tried rest for the symptoms. The treatment provided mild relief.  Hypertension This is a chronic problem. The current episode started more than 1 year ago. The problem has been resolved since onset. The problem is controlled. Pertinent negatives include no anxiety, blurred vision, headaches or peripheral edema. Risk factors for coronary artery disease include dyslipidemia, male gender, obesity and sedentary lifestyle. Past treatments include ACE inhibitors and calcium channel blockers. The current treatment provides moderate improvement. There is no history of kidney disease, CAD/MI, CVA, heart failure or a thyroid problem. There is no history of sleep apnea.  Hyperlipidemia This is a chronic problem. The current episode started more than 1 year ago. The problem is uncontrolled. Recent lipid tests were reviewed and are high. Factors aggravating his hyperlipidemia include fatty foods. Current antihyperlipidemic treatment includes diet change and herbal therapy. The current treatment provides moderate improvement of lipids. Risk factors for coronary artery disease include dyslipidemia, hypertension, male sex, obesity and a sedentary lifestyle.  Diabetes He presents for his initial diabetic visit. He has type 2 diabetes mellitus. His disease course has been worsening. Hypoglycemia symptoms include nervousness/anxiousness. Pertinent negatives for hypoglycemia include no confusion, dizziness or headaches. Associated symptoms include fatigue. Pertinent negatives for diabetes include no blurred vision,  no foot paresthesias, no foot ulcerations and no visual change. Symptoms are worsening. Pertinent negatives for diabetic complications include no CVA, heart disease, nephropathy or peripheral neuropathy. Risk factors for coronary artery disease include diabetes mellitus, dyslipidemia, male sex, obesity, hypertension and family history. When asked about current treatments, none were reported. He is following a generally healthy diet. His breakfast blood glucose range is generally >200 mg/dl. An ACE inhibitor/angiotensin II receptor blocker is being taken. Eye exam is not current.      Review of Systems  Constitutional: Positive for fatigue. Negative for chills.  HENT: Negative.  Negative for sore throat.   Eyes: Negative for blurred vision.  Respiratory: Negative.  Negative for cough.   Cardiovascular: Negative.   Gastrointestinal: Negative for nausea.  Endocrine: Negative.   Genitourinary: Negative.   Musculoskeletal: Negative.   Neurological: Negative.  Negative for dizziness and headaches.  Hematological: Negative.   Psychiatric/Behavioral: Negative for confusion. The patient is nervous/anxious.   All other systems reviewed and are negative.      Objective:   Physical Exam  Constitutional: He is oriented to person, place, and time. He appears well-developed and well-nourished. No distress.  Morbid obese   HENT:  Head: Normocephalic.  Right Ear: External ear normal.  Left Ear: External ear normal.  Mouth/Throat: Oropharynx is clear and moist.  Eyes: Pupils are equal, round, and reactive to light. Right eye exhibits no discharge. Left eye exhibits no discharge.  Neck: Normal range of motion. Neck supple. No thyromegaly present.  Cardiovascular: Normal rate, regular rhythm, normal heart sounds and intact distal pulses.   No murmur heard. Pulmonary/Chest: Effort normal and breath sounds normal. No respiratory distress. He has no wheezes.  Abdominal: Soft. Bowel sounds are normal.  He exhibits no distension. There is no tenderness.  Musculoskeletal: Normal range of motion. He exhibits no  edema or tenderness.  Neurological: He is alert and oriented to person, place, and time. He has normal reflexes. No cranial nerve deficit.  Skin: Skin is warm and dry. No rash noted. No erythema.  Psychiatric: He has a normal mood and affect. His behavior is normal. Judgment and thought content normal.  Vitals reviewed.   BP 131/90 mmHg  Pulse 88  Temp(Src) 98.4 F (36.9 C) (Oral)  Ht 5' 11"  (1.803 m)  Wt 266 lb 9.6 oz (120.929 kg)  BMI 37.20 kg/m2       Assessment & Plan:  1. Essential hypertension -PT told to continue lisinopril and pt can stop Norvasc  - CMP14+EGFR  2. Hyperlipidemia - CMP14+EGFR - Lipid panel  3. Morbid obesity with BMI of 40.0-44.9, adult ([place]) - CMP14+EGFR  4. Type 2 diabetes mellitus with hyperglycemia, without long-term current use of insulin (HCC) PT started on metformin today -Long discussion about low carb diet -Given glucose meter today -Pt to make appt with Tammy - Bayer DCA Hb A1c Waived - CMP14+EGFR - Microalbumin / creatinine urine ratio - Ambulatory referral to Ophthalmology - metFORMIN (GLUCOPHAGE) 1000 MG tablet; Take 1 tablet (1,000 mg total) by mouth 2 (two) times daily with a meal.  Dispense: 180 tablet; Refill: 1 - glucose blood (GLUCOSE METER TEST) test strip; Use BID  Dispense: 100 each; Refill: 12 - blood glucose meter kit and supplies; Dispense based on patient and insurance preference. Use up to four times daily as directed. (FOR ICD-9 250.00, 250.01).  Dispense: 1 each; Refill: 0  5. Colon cancer screening - Fecal occult blood, imunochemical; Future   Continue all meds Labs pending Health Maintenance reviewed Diet and exercise encouraged RTO 1 month  Evelina Dun, FNP

## 2015-07-29 NOTE — Patient Instructions (Signed)

## 2015-07-30 ENCOUNTER — Other Ambulatory Visit: Payer: Self-pay | Admitting: Family

## 2015-07-30 LAB — CMP14+EGFR
A/G RATIO: 2 (ref 1.2–2.2)
ALBUMIN: 4.5 g/dL (ref 3.5–5.5)
ALT: 49 IU/L — ABNORMAL HIGH (ref 0–44)
AST: 33 IU/L (ref 0–40)
Alkaline Phosphatase: 113 IU/L (ref 39–117)
BILIRUBIN TOTAL: 0.8 mg/dL (ref 0.0–1.2)
BUN / CREAT RATIO: 16 (ref 9–20)
BUN: 18 mg/dL (ref 6–24)
CALCIUM: 9.8 mg/dL (ref 8.7–10.2)
CHLORIDE: 92 mmol/L — AB (ref 96–106)
CO2: 19 mmol/L (ref 18–29)
Creatinine, Ser: 1.11 mg/dL (ref 0.76–1.27)
GFR, EST AFRICAN AMERICAN: 87 mL/min/{1.73_m2} (ref >59)
GFR, EST NON AFRICAN AMERICAN: 75 mL/min/{1.73_m2} (ref >59)
GLOBULIN, TOTAL: 2.3 g/dL (ref 1.5–4.5)
Glucose: 460 mg/dL (ref 65–99)
POTASSIUM: 6.1 mmol/L — AB (ref 3.5–5.2)
SODIUM: 135 mmol/L (ref 134–144)
TOTAL PROTEIN: 6.8 g/dL (ref 6.0–8.5)

## 2015-07-30 LAB — LIPID PANEL
Chol/HDL Ratio: 7.9 ratio units — ABNORMAL HIGH (ref 0.0–5.0)
Cholesterol, Total: 212 mg/dL — ABNORMAL HIGH (ref 100–199)
HDL: 27 mg/dL — ABNORMAL LOW (ref >39)
Triglycerides: 796 mg/dL (ref 0–149)

## 2015-07-30 LAB — MICROALBUMIN / CREATININE URINE RATIO
CREATININE, UR: 46.1 mg/dL
MICROALB/CREAT RATIO: 24.5 mg/g{creat} (ref 0.0–30.0)
MICROALBUM., U, RANDOM: 11.3 ug/mL

## 2015-07-30 MED ORDER — ROSUVASTATIN CALCIUM 20 MG PO TABS: 20.0000 mg | ORAL_TABLET | Freq: Every day | ORAL | Status: DC

## 2015-07-31 ENCOUNTER — Telehealth: Payer: Self-pay | Admitting: Family

## 2015-07-31 DIAGNOSIS — I10 Essential (primary) hypertension: Secondary | ICD-10-CM | POA: Diagnosis not present

## 2015-07-31 DIAGNOSIS — Z79899 Other long term (current) drug therapy: Secondary | ICD-10-CM | POA: Diagnosis not present

## 2015-07-31 DIAGNOSIS — Z7984 Long term (current) use of oral hypoglycemic drugs: Secondary | ICD-10-CM | POA: Diagnosis not present

## 2015-07-31 DIAGNOSIS — R918 Other nonspecific abnormal finding of lung field: Secondary | ICD-10-CM | POA: Diagnosis not present

## 2015-07-31 DIAGNOSIS — R0602 Shortness of breath: Secondary | ICD-10-CM | POA: Diagnosis not present

## 2015-07-31 DIAGNOSIS — Z6837 Body mass index (BMI) 37.0-37.9, adult: Secondary | ICD-10-CM | POA: Diagnosis not present

## 2015-07-31 DIAGNOSIS — E119 Type 2 diabetes mellitus without complications: Secondary | ICD-10-CM | POA: Diagnosis not present

## 2015-07-31 DIAGNOSIS — E785 Hyperlipidemia, unspecified: Secondary | ICD-10-CM | POA: Diagnosis not present

## 2015-07-31 DIAGNOSIS — E1310 Other specified diabetes mellitus with ketoacidosis without coma: Secondary | ICD-10-CM | POA: Diagnosis not present

## 2015-07-31 DIAGNOSIS — R112 Nausea with vomiting, unspecified: Secondary | ICD-10-CM | POA: Diagnosis not present

## 2015-07-31 DIAGNOSIS — E1165 Type 2 diabetes mellitus with hyperglycemia: Secondary | ICD-10-CM | POA: Diagnosis not present

## 2015-07-31 NOTE — Telephone Encounter (Signed)
The nausea and diarrhea that comes along with starting metformin is a known effect but it usually dissipates within the first week. Have him cut it in half for the first week and then slowly increased to the 1000th and see if that helps

## 2015-07-31 NOTE — Telephone Encounter (Signed)
Please call to discuss medication

## 2015-07-31 NOTE — Telephone Encounter (Signed)
Please advise 

## 2015-08-01 ENCOUNTER — Telehealth: Payer: Self-pay | Admitting: Family

## 2015-08-01 DIAGNOSIS — E785 Hyperlipidemia, unspecified: Secondary | ICD-10-CM | POA: Diagnosis not present

## 2015-08-01 DIAGNOSIS — E1310 Other specified diabetes mellitus with ketoacidosis without coma: Secondary | ICD-10-CM | POA: Diagnosis not present

## 2015-08-01 DIAGNOSIS — I10 Essential (primary) hypertension: Secondary | ICD-10-CM | POA: Diagnosis not present

## 2015-08-01 DIAGNOSIS — E119 Type 2 diabetes mellitus without complications: Secondary | ICD-10-CM | POA: Diagnosis not present

## 2015-08-01 NOTE — Telephone Encounter (Signed)
Patient coming for appointment tomorrow. Advised to bring all medications for review. May need a blood sugar test. Hospital stay at [facility] [phone].

## 2015-08-01 NOTE — Telephone Encounter (Signed)
Spoke with patient. Records from hospitalization are available for review through [place]. He was hospitalized with DKA and pneumonia and discharged this morning.  Follow up appt tomorrow. They have him on Lantus 10 units QHS and Januvia and have discontinued Metformin for now. Plan is to bridge off of Lantus and add Metformin back at a lower dose.  He was given 5 units this morning but was unable to pickup the Lantus prescription at the pharmacy today. They are ordering it for him.   Discharge instructions: Recommendations to physicians: Repeat labs 08/02/2015 with follow-up with primary care provider. Review diabetes regimen with patient. Patient scheduled to restart metformin in one week, At a lower dose to improve tolerance.Metformin is contraindicated in acidosis and creatinine greater than 1.5 in men.  Current Medications: lisinopril (PRINIVIL,ZESTRIL) 10 mg tablet  Take 10 mg by mouth daily.      amoxicillin-clavulanate (AUGMENTIN) 875-125 mg per tablet  Indications: Lower Respiratory Tract Infection Take one tablet by mouth every 12 (twelve) hours. 14 tablet  0 08/01/2015 08/08/2015  insulin glargine (LANTUS) 100 Unit/mL injection  Inject 0.1 mLs (10 Units total) into the skin at bedtime. 1 vial  0 08/01/2015   metformin (GLUCOPHAGE) 1000 MG tablet  Indications: Type 2 Diabetes Mellitus 500 mg Once a day with meals for one week. Then take 500 mg twice a day.   08/06/2015   sitaGLIPtin (JANUVIA) 25 mg tablet  Take one tablet (25 mg total) by mouth with breakfast. 30 tablet  0 08/01/2015 07/31/2016

## 2015-08-01 NOTE — Telephone Encounter (Signed)
Please call us.  Which hospital were you in?   Probably will need only a fasting blood sugar when you come for your appointment. Will discuss metformin at your visit.

## 2015-08-02 ENCOUNTER — Other Ambulatory Visit: Payer: Self-pay

## 2015-08-02 ENCOUNTER — Ambulatory Visit (INDEPENDENT_AMBULATORY_CARE_PROVIDER_SITE_OTHER): Payer: BLUE CROSS/BLUE SHIELD | Admitting: Family

## 2015-08-02 ENCOUNTER — Encounter: Payer: Self-pay | Admitting: Family

## 2015-08-02 VITALS — BP 121/74 | HR 43 | Temp 98.9°F | Ht 71.0 in | Wt 266.2 lb

## 2015-08-02 DIAGNOSIS — E1310 Other specified diabetes mellitus with ketoacidosis without coma: Secondary | ICD-10-CM

## 2015-08-02 DIAGNOSIS — E1165 Type 2 diabetes mellitus with hyperglycemia: Secondary | ICD-10-CM

## 2015-08-02 DIAGNOSIS — Z09 Encounter for follow-up examination after completed treatment for conditions other than malignant neoplasm: Secondary | ICD-10-CM | POA: Diagnosis not present

## 2015-08-02 DIAGNOSIS — J189 Pneumonia, unspecified organism: Secondary | ICD-10-CM

## 2015-08-02 DIAGNOSIS — E1110 Type 2 diabetes mellitus with ketoacidosis without coma: Secondary | ICD-10-CM

## 2015-08-02 LAB — URINALYSIS, COMPLETE
BILIRUBIN UA: NEGATIVE
LEUKOCYTES UA: NEGATIVE
Nitrite, UA: NEGATIVE
Protein, UA: NEGATIVE
SPEC GRAV UA: 1.015 (ref 1.005–1.030)
Urobilinogen, Ur: 0.2 mg/dL (ref 0.2–1.0)
pH, UA: 5.5 (ref 5.0–7.5)

## 2015-08-02 LAB — CMP14+EGFR
ALK PHOS: 79 IU/L (ref 39–117)
ALT: 65 IU/L — ABNORMAL HIGH (ref 0–44)
AST: 40 IU/L (ref 0–40)
Albumin/Globulin Ratio: 2.4 — ABNORMAL HIGH (ref 1.2–2.2)
Albumin: 4.1 g/dL (ref 3.5–5.5)
BILIRUBIN TOTAL: 0.6 mg/dL (ref 0.0–1.2)
BUN/Creatinine Ratio: 14 (ref 9–20)
BUN: 13 mg/dL (ref 6–24)
CHLORIDE: 95 mmol/L — AB (ref 96–106)
CO2: 16 mmol/L — AB (ref 18–29)
CREATININE: 0.96 mg/dL (ref 0.76–1.27)
Calcium: 9.5 mg/dL (ref 8.7–10.2)
GFR calc Af Amer: 104 mL/min/{1.73_m2} (ref >59)
GFR calc non Af Amer: 90 mL/min/{1.73_m2} (ref >59)
GLOBULIN, TOTAL: 1.7 g/dL (ref 1.5–4.5)
GLUCOSE: 309 mg/dL — AB (ref 65–99)
Potassium: 4.9 mmol/L (ref 3.5–5.2)
SODIUM: 135 mmol/L (ref 134–144)
Total Protein: 5.8 g/dL — ABNORMAL LOW (ref 6.0–8.5)

## 2015-08-02 LAB — MICROSCOPIC EXAMINATION
Bacteria, UA: NONE SEEN
EPITHELIAL CELLS (NON RENAL): NONE SEEN /HPF (ref 0–10)

## 2015-08-02 MED ORDER — "INSULIN SYRINGE-NEEDLE U-100 30G X 3/8"" 0.3 ML MISC": Status: DC

## 2015-08-02 MED ORDER — INSULIN PEN NEEDLE 31G X 4 MM MISC: 1.0000 | Freq: Three times a day (TID) | Status: DC

## 2015-08-02 MED ORDER — SITAGLIPTIN PHOSPHATE 50 MG PO TABS: 50.0000 mg | ORAL_TABLET | Freq: Every day | ORAL | Status: DC

## 2015-08-02 MED ORDER — DULAGLUTIDE 0.75 MG/0.5ML ~~LOC~~ SOAJ: 0.7500 mg | SUBCUTANEOUS | Status: DC

## 2015-08-02 MED ORDER — INSULIN GLARGINE 100 UNIT/ML ~~LOC~~ SOLN: SUBCUTANEOUS | Status: DC

## 2015-08-02 NOTE — Patient Instructions (Signed)

## 2015-08-02 NOTE — Addendum Note (Signed)
Addended by: Evelina Dun A on: 08/02/2015 11:05 AM   Modules accepted: Orders

## 2015-08-02 NOTE — Progress Notes (Signed)
   Subjective:    Patient ID: Gregory Carey, male    DOB: [birth year], 55 y.o.   MRN: [id number]  HPI Pt presents to the office today for hospital follow up today. PT went to ED on 07/31/15 for DKA and pneumonia. Pt was started on Lantus 10 units at bedtime and Januvia 25 mg in AM. PT was told to start metformin 500 mg daily on 08/06/15 and after one week increase to 500 mg BID. Pt reports his fasting blood sugar was 304 this AM.    Review of Systems  Constitutional: Negative.   HENT: Negative.   Respiratory: Negative.   Cardiovascular: Negative.   Gastrointestinal: Negative.   Endocrine: Negative.   Genitourinary: Negative.   Musculoskeletal: Negative.   Neurological: Negative.   Hematological: Negative.   Psychiatric/Behavioral: Negative.   All other systems reviewed and are negative.      Objective:   Physical Exam  Constitutional: He is oriented to person, place, and time. He appears well-developed and well-nourished. No distress.  HENT:  Head: Normocephalic.  Eyes: Pupils are equal, round, and reactive to light. Right eye exhibits no discharge. Left eye exhibits no discharge.  Neck: Normal range of motion. Neck supple. No thyromegaly present.  Cardiovascular: Normal rate, regular rhythm, normal heart sounds and intact distal pulses.   No murmur heard. Pulmonary/Chest: Effort normal and breath sounds normal. No respiratory distress. He has no wheezes.  Abdominal: Soft. Bowel sounds are normal. He exhibits no distension. There is no tenderness.  Musculoskeletal: Normal range of motion. He exhibits no edema or tenderness.  Neurological: He is alert and oriented to person, place, and time.  Skin: Skin is warm and dry. No rash noted. No erythema.  Psychiatric: He has a normal mood and affect. His behavior is normal. Judgment and thought content normal.  Vitals reviewed.     BP 121/74 mmHg  Pulse 43  Temp(Src) 98.9 F (37.2 C) (Oral)  Ht '5\' 11"'$  (1.803 m)  Wt 266 lb 3.2  oz (120.748 kg)  BMI 37.14 kg/m2     Assessment & Plan:  1. Type 2 diabetes mellitus with hyperglycemia, without long-term current use of insulin (HCC) -PT's Januvia increased to 50 mg daily -Pt started on Trulicity today -Low carb diet -Keep appt with Tammy -RTO in 2 weeks - sitaGLIPtin (JANUVIA) 50 MG tablet; Take 1 tablet (50 mg total) by mouth daily.  Dispense: 90 tablet; Refill: 1 - CMP14+EGFR - Dulaglutide (TRULICITY) 8.25 OI/3.7CW SOPN; Inject 0.75 mg into the skin once a week.  Dispense: 12 pen; Refill: 3  2. Diabetic ketoacidosis without coma associated with type 2 diabetes mellitus (HCC) - CMP14+EGFR - Urinalysis, Complete  3. Hospital discharge follow-up - CMP14+EGFR  4. CAP (community acquired pneumonia) -Continue Augmentin - CMP14+EGFR  Evelina Dun, FNP

## 2015-08-05 ENCOUNTER — Telehealth: Payer: Self-pay | Admitting: Family

## 2015-08-06 NOTE — Telephone Encounter (Signed)
ok 

## 2015-08-08 ENCOUNTER — Ambulatory Visit (INDEPENDENT_AMBULATORY_CARE_PROVIDER_SITE_OTHER): Payer: BLUE CROSS/BLUE SHIELD | Admitting: Pharmacist

## 2015-08-08 ENCOUNTER — Encounter: Payer: Self-pay | Admitting: Pharmacist

## 2015-08-08 VITALS — BP 130/78 | HR 77 | Ht 71.0 in | Wt 272.0 lb

## 2015-08-08 DIAGNOSIS — E1165 Type 2 diabetes mellitus with hyperglycemia: Secondary | ICD-10-CM | POA: Diagnosis not present

## 2015-08-08 DIAGNOSIS — E669 Obesity, unspecified: Secondary | ICD-10-CM

## 2015-08-08 NOTE — Progress Notes (Signed)
Subjective:    Gregory Carey is a 55 y.o. male who presents for an Initial evaluation of Type 2 diabetes mellitus.   Gregory Carey was seen 07/29/2015 by Evelina Dun, NP after checking BG at home with readings in 400's and 500's.   BG was found at that visit to be very elevated and A1c was over 14%.  He was started on metformin 1000mg  bid but has nausea and eventually was hospitalized for DKA.  He is now taking Lantus 10 units qhs, Trulicity 0.75mg  weekly, Januvia 50mg  qd and metformin 500mg  qd (with plans to increase to bid in 1-2 days).  He is tolerating lower dose of metformin.  Known diabetic complications: none Cardiovascular risk factors: advanced age (older than 87 for men, 63 for women), diabetes mellitus, dyslipidemia, family history of premature cardiovascular disease, hypertension, male gender, obesity (BMI >= 30 kg/m2) and sedentary lifestyle  Eye exam current (within one year): no - he has an appt 08/15/2015 but due to recent diagnosis of DM and A1c of greater than 14% would recommend waiting 1-2 months. Weight trend: stable but he had lost 30# in 1 month prior to diagnosis Prior visit with dietician: no Current diet: in general, an "unhealthy" diet Current exercise: none  Current monitoring regimen: home blood tests - 1-2 times daily Home blood sugar records: much improved.  Last week BG range was 225 to 400's.  This week range is 135 to 239. Any episodes of hypoglycemia? no  Is He on ACE inhibitor or angiotensin II receptor blocker?  Yes  lisinopril (Prinivil)    The following portions of the patient's history were reviewed and updated as appropriate: allergies, current medications, past family history, past medical history, past social history, past surgical history and problem list.   Objective:    BP 130/78 mmHg  Pulse 77  Ht 5\' 11"  (1.803 m)  Wt 272 lb (123.378 kg)  BMI 37.95 kg/m2   A1c = over 14% (07/29/2015)  Lipid Panel 07/29/2015) tg = 796 Total  cholesterol = 212 LDL = not calculated  Lab Review GLUCOSE (mg/dL)  Date Value  08/02/2015 309*  07/29/2015 460*  04/29/2015 180*   CO2 (mmol/L)  Date Value  08/02/2015 16*  07/29/2015 19  04/29/2015 24   BUN (mg/dL)  Date Value  08/02/2015 13  07/29/2015 18  04/29/2015 20   CREATININE, SER (mg/dL)  Date Value  08/02/2015 0.96  07/29/2015 1.11  04/29/2015 1.28*    Assessment:    Diabetes Mellitus type II, under improving  control.   Obesity   Plan:    1.  Rx changes: hold Januvia as MOA is similar to Trulicity and not sure is adding much to BG control   Continue with plan to increase metformin in 1-2 day to 500mg  bid.   Continue Trulicity 0.75mg  and Lantus 10 units qd  Patient has appt to see PCP next week - recommend consider increase Trulicity to 1.5mg  and maybe decreased Lantus to 5 units and eventually taper off if BG reading are approaching 120's in am. 2.  Education: Reviewed 'ABCs' of diabetes management (respective goals in parentheses):  A1C (<7), blood pressure (<130/80), and cholesterol (LDL <100). Changed eye exam to 10/18/15. 3. Discussed pathophysiology of DM; difference between type 1 and type 2 DM. 4. CHO counting diet discussed.  Reviewed CHO amount in various foods and how to read nutrition labels.  Discussed recommended serving sizes. Handout given and discussed. 5.  Recommended increase physical activity - goal is  150 minutes per week 6. Follow up: 1 week with PCP

## 2015-08-08 NOTE — Patient Instructions (Addendum)
Diabetes and Standards of Medical Care   Diabetes is complicated. You may find that your diabetes team includes a dietitian, nurse, diabetes educator, eye doctor, and more. To help everyone know what is going on and to help you get the care you deserve, the following schedule of care was developed to help keep you on track. Below are the tests, exams, vaccines, medicines, education, and plans you will need.  Blood Glucose Goals Prior to meals = 80 - 130 Within 2 hours of the start of a meal = less than 180  HbA1c test (goal is less than 7.0% - your last value was over 14%) This test shows how well you have controlled your glucose over the past 2 to 3 months. It is used to see if your diabetes management plan needs to be adjusted.   It is performed at least 2 times a year if you are meeting treatment goals.  It is performed 4 times a year if therapy has changed or if you are not meeting treatment goals.  Blood pressure test  This test is performed at every routine medical visit. The goal is less than 140/90 mmHg for most people, but 130/80 mmHg in some cases. Ask your health care provider about your goal.  Dental exam  Follow up with the dentist regularly.  Eye exam  If you are diagnosed with type 1 diabetes as a child, get an exam upon reaching the age of 10 years or older and have had diabetes for 3 to 5 years. Yearly eye exams are recommended after that initial eye exam.  If you are diagnosed with type 1 diabetes as an adult, get an exam within 5 years of diagnosis and then yearly.  If you are diagnosed with type 2 diabetes, get an exam as soon as possible after the diagnosis and then yearly.  Foot care exam  Visual foot exams are performed at every routine medical visit. The exams check for cuts, injuries, or other problems with the feet.  A comprehensive foot exam should be done yearly. This includes visual inspection as well as assessing foot pulses and testing for loss of  sensation.  Check your feet nightly for cuts, injuries, or other problems with your feet. Tell your health care provider if anything is not healing.  Kidney function test (urine microalbumin)  This test is performed once a year.  Type 1 diabetes: The first test is performed 5 years after diagnosis.  Type 2 diabetes: The first test is performed at the time of diagnosis.  A serum creatinine and estimated glomerular filtration rate (eGFR) test is done once a year to assess the level of chronic kidney disease (CKD), if present.  Lipid profile (cholesterol, HDL, LDL, triglycerides)  Performed every 5 years for most people.  The goal for LDL is less than 100 mg/dL. If you are at high risk, the goal is less than 70 mg/dL.  The goal for HDL is 40 mg/dL to 50 mg/dL for men and 50 mg/dL to 60 mg/dL for women. An HDL cholesterol of 60 mg/dL or higher gives some protection against heart disease.  The goal for triglycerides is less than 150 mg/dL.  Influenza vaccine, pneumococcal vaccine, and hepatitis B vaccine  The influenza vaccine is recommended yearly.  The pneumococcal vaccine is generally given once in a lifetime. However, there are some instances when another vaccination is recommended. Check with your health care provider.  The hepatitis B vaccine is also recommended for adults with diabetes.    Diabetes self-management education  Education is recommended at diagnosis and ongoing as needed.  Treatment plan  Your treatment plan is reviewed at every medical visit.  Document Released: 11/30/2008 Document Revised: 10/05/2012 Document Reviewed: 07/05/2012 [employer] Patient Information 2014 [employer].   

## 2015-08-14 DIAGNOSIS — C44612 Basal cell carcinoma of skin of right upper limb, including shoulder: Secondary | ICD-10-CM | POA: Diagnosis not present

## 2015-08-16 ENCOUNTER — Ambulatory Visit (INDEPENDENT_AMBULATORY_CARE_PROVIDER_SITE_OTHER): Payer: BLUE CROSS/BLUE SHIELD | Admitting: Family

## 2015-08-16 ENCOUNTER — Encounter: Payer: Self-pay | Admitting: Family

## 2015-08-16 ENCOUNTER — Ambulatory Visit: Payer: BLUE CROSS/BLUE SHIELD | Admitting: Family

## 2015-08-16 VITALS — BP 142/82 | HR 60 | Temp 98.4°F | Ht 71.0 in | Wt 270.0 lb

## 2015-08-16 DIAGNOSIS — I10 Essential (primary) hypertension: Secondary | ICD-10-CM | POA: Diagnosis not present

## 2015-08-16 DIAGNOSIS — E1165 Type 2 diabetes mellitus with hyperglycemia: Secondary | ICD-10-CM

## 2015-08-16 DIAGNOSIS — Z794 Long term (current) use of insulin: Secondary | ICD-10-CM | POA: Diagnosis not present

## 2015-08-16 MED ORDER — DULAGLUTIDE 1.5 MG/0.5ML ~~LOC~~ SOAJ: 1.5000 mg | SUBCUTANEOUS | Status: DC

## 2015-08-16 MED ORDER — INSULIN GLARGINE 100 UNIT/ML ~~LOC~~ SOLN: 5.0000 [IU] | Freq: Every day | SUBCUTANEOUS | Status: DC

## 2015-08-16 NOTE — Progress Notes (Signed)
Subjective:    Patient ID: Gregory Carey, male    DOB: August 13, 1960, 55 y.o.   MRN: [id number]  Pt presents to the office today to recheck DM 2. PT was diagnosed as a new DM 2 this month and went to the ED for DKA. Pt had a follow up with our clinical pharmacists last week for diabetic education.   Diabetes He presents for his follow-up diabetic visit. He has type 2 diabetes mellitus. His disease course has been fluctuating. There are no hypoglycemic associated symptoms. Pertinent negatives for hypoglycemia include no confusion, dizziness, headaches, hunger or nervousness/anxiousness. Associated symptoms include visual change. Pertinent negatives for diabetes include no fatigue, no foot paresthesias, no polydipsia and no polyphagia. There are no hypoglycemic complications. Symptoms are worsening. Pertinent negatives for diabetic complications include no CVA, heart disease, nephropathy or peripheral neuropathy. Risk factors for coronary artery disease include diabetes mellitus, family history, male sex, obesity and sedentary lifestyle. Current diabetic treatment includes insulin injections and oral agent (triple therapy). His breakfast blood glucose range is generally 110-130 mg/dl. An ACE inhibitor/angiotensin II receptor blocker is being taken. Eye exam is not current (schedule Oct 18 2015).  Hypertension This is a chronic problem. The current episode started more than 1 year ago. The problem has been waxing and waning since onset. The problem is uncontrolled. Pertinent negatives include no anxiety, headaches, malaise/fatigue, palpitations, peripheral edema or shortness of breath. Risk factors for coronary artery disease include dyslipidemia, diabetes mellitus, male gender, obesity and sedentary lifestyle. Past treatments include ACE inhibitors. There is no history of kidney disease, CAD/MI, CVA, heart failure or a thyroid problem.      Review of Systems  Constitutional: Negative.  Negative for  malaise/fatigue and fatigue.  HENT: Negative.   Respiratory: Negative.  Negative for shortness of breath.   Cardiovascular: Negative.  Negative for palpitations.  Gastrointestinal: Negative.   Endocrine: Negative.  Negative for polydipsia and polyphagia.  Genitourinary: Negative.   Musculoskeletal: Negative.   Neurological: Negative.  Negative for dizziness and headaches.  Hematological: Negative.   Psychiatric/Behavioral: Negative.  Negative for confusion. The patient is not nervous/anxious.   All other systems reviewed and are negative.      Objective:   Physical Exam  Constitutional: He is oriented to person, place, and time. He appears well-developed and well-nourished. No distress.  Morbid Obese   HENT:  Head: Normocephalic.  Eyes: Pupils are equal, round, and reactive to light. Right eye exhibits no discharge. Left eye exhibits no discharge.  Neck: Normal range of motion. Neck supple. No thyromegaly present.  Cardiovascular: Normal rate, regular rhythm, normal heart sounds and intact distal pulses.   No murmur heard. Pulmonary/Chest: Effort normal and breath sounds normal. No respiratory distress. He has no wheezes.  Abdominal: Soft. Bowel sounds are normal. He exhibits no distension. There is no tenderness.  Musculoskeletal: Normal range of motion. He exhibits no edema or tenderness.  Neurological: He is alert and oriented to person, place, and time. He has normal reflexes. No cranial nerve deficit.  Skin: Skin is warm and dry. No rash noted. No erythema.  Psychiatric: He has a normal mood and affect. His behavior is normal. Judgment and thought content normal.  Vitals reviewed.   BP 142/82 mmHg  Pulse 60  Temp(Src) 98.4 F (36.9 C) (Oral)  Ht 5\' 11"  (1.803 m)  Wt 270 lb (122.471 kg)  BMI 37.67 kg/m2       Assessment & Plan:  1. Type 2 diabetes mellitus with  hyperglycemia, with long-term current use of insulin (HCC) -Pt's lantus decreased to 5 units at bedtime  from 10 units -Trulicity increased to 1.5mg  from 0.75mg  -Low carb diet - insulin glargine (LANTUS) 100 UNIT/ML injection; Inject 0.05 mLs (5 Units total) into the skin at bedtime. 10 units at bedtime  Dispense: 10 mL; Refill: 3 - Dulaglutide (TRULICITY) 1.5 0000000 SOPN; Inject 1.5 mg into the skin once a week.  Dispense: 12 pen; Refill: 3  2. Essential hypertension -Continue Lisinopril Will not adjust any medications at this time because pt's BP was WNL at last visit. PT states he was stressed and has been "running around all day"  3. Type 2 diabetes mellitus with hyperglycemia, without long-term current use of insulin (HCC) - insulin glargine (LANTUS) 100 UNIT/ML injection; Inject 0.05 mLs (5 Units total) into the skin at bedtime. 10 units at bedtime  Dispense: 10 mL; Refill: 3 - Dulaglutide (TRULICITY) 1.5 0000000 SOPN; Inject 1.5 mg into the skin once a week.  Dispense: 12 pen; Refill: 3   Continue all meds Labs pending Health Maintenance reviewed Diet and exercise encouraged RTO 3 months  Evelina Dun, FNP

## 2015-08-16 NOTE — Patient Instructions (Signed)

## 2015-08-30 ENCOUNTER — Ambulatory Visit (INDEPENDENT_AMBULATORY_CARE_PROVIDER_SITE_OTHER): Payer: BLUE CROSS/BLUE SHIELD | Admitting: Family

## 2015-08-30 ENCOUNTER — Encounter: Payer: Self-pay | Admitting: Family

## 2015-08-30 VITALS — BP 136/86 | HR 76 | Temp 99.0°F | Ht 71.0 in | Wt 263.4 lb

## 2015-08-30 DIAGNOSIS — E1165 Type 2 diabetes mellitus with hyperglycemia: Secondary | ICD-10-CM | POA: Diagnosis not present

## 2015-08-30 DIAGNOSIS — Z794 Long term (current) use of insulin: Secondary | ICD-10-CM | POA: Diagnosis not present

## 2015-08-30 LAB — BAYER DCA HB A1C WAIVED: HB A1C (BAYER DCA - WAIVED): 10.9 % — ABNORMAL HIGH (ref <7.0)

## 2015-08-30 MED ORDER — ASPIRIN EC 81 MG PO TBEC: 81.0000 mg | DELAYED_RELEASE_TABLET | Freq: Every day | ORAL | Status: DC

## 2015-08-30 NOTE — Patient Instructions (Signed)

## 2015-08-30 NOTE — Progress Notes (Signed)
   Subjective:    Patient ID: Gregory Carey, male    DOB: 1960/04/06, 55 y.o.   MRN: [id number]  PT presents to the office today to recheck Diabetes. Pt has stopped lantus on Wednesday per clinical pharmacists. Pt currently taking metformin '500mg'$  BID and Trulicity 1.5 mg q weekly. Pt's average BS has been 105 in the AM.  Diabetes He presents for his follow-up diabetic visit. He has type 2 diabetes mellitus. His disease course has been improving. There are no hypoglycemic associated symptoms. Pertinent negatives for hypoglycemia include no confusion. Pertinent negatives for diabetes include no blurred vision, no foot paresthesias, no foot ulcerations and no visual change. There are no hypoglycemic complications. Symptoms are improving. There are no diabetic complications. Pertinent negatives for diabetic complications include no CVA, heart disease, nephropathy or peripheral neuropathy. Risk factors for coronary artery disease include dyslipidemia, obesity, male sex, family history and sedentary lifestyle. Current diabetic treatment includes diet and oral agent (dual therapy). He participates in exercise daily. His breakfast blood glucose range is generally 90-110 mg/dl. Eye exam is current.      Review of Systems  Constitutional: Negative.   HENT: Negative.   Eyes: Negative for blurred vision.  Respiratory: Negative.   Cardiovascular: Negative.   Gastrointestinal: Negative.   Endocrine: Negative.   Genitourinary: Negative.   Musculoskeletal: Negative.   Neurological: Negative.   Hematological: Negative.   Psychiatric/Behavioral: Negative.  Negative for confusion.  All other systems reviewed and are negative.      Objective:   Physical Exam  Constitutional: He is oriented to person, place, and time. He appears well-developed and well-nourished. No distress.  HENT:  Head: Normocephalic.  Eyes: Pupils are equal, round, and reactive to light. Right eye exhibits no discharge. Left eye  exhibits no discharge.  Neck: Normal range of motion. Neck supple. No thyromegaly present.  Cardiovascular: Normal rate, regular rhythm, normal heart sounds and intact distal pulses.   No murmur heard. Pulmonary/Chest: Effort normal and breath sounds normal. No respiratory distress. He has no wheezes.  Abdominal: Soft. Bowel sounds are normal. He exhibits no distension. There is no tenderness.  Musculoskeletal: Normal range of motion. He exhibits no edema or tenderness.  Neurological: He is alert and oriented to person, place, and time. He has normal reflexes. No cranial nerve deficit.  Skin: Skin is warm and dry. No rash noted. No erythema.  Psychiatric: He has a normal mood and affect. His behavior is normal. Judgment and thought content normal.  Vitals reviewed.     BP 136/86 mmHg  Pulse 76  Temp(Src) 99 F (37.2 C) (Oral)  Ht '5\' 11"'$  (1.803 m)  Wt 263 lb 6.4 oz (119.477 kg)  BMI 36.75 kg/m2     Assessment & Plan:  1. Type 2 diabetes mellitus with hyperglycemia, with long-term current use of insulin (HCC) -Continue low carb diet -Continue Metformin and lantus  -Keep follow up appts and appts with Tammy -Bring back FOBT -Keep Ophthamology Sep 1 RTO in 3 months - CMP14+EGFR - Bayer DCA Hb A1c Waived - aspirin EC 81 MG tablet; Take 1 tablet (81 mg total) by mouth daily.  Dispense: 90 tablet; Refill: [place], FNP

## 2015-08-31 LAB — CMP14+EGFR
A/G RATIO: 2 (ref 1.2–2.2)
ALT: 24 IU/L (ref 0–44)
AST: 23 IU/L (ref 0–40)
Albumin: 4.4 g/dL (ref 3.5–5.5)
Alkaline Phosphatase: 63 IU/L (ref 39–117)
BUN/Creatinine Ratio: 15 (ref 9–20)
BUN: 12 mg/dL (ref 6–24)
Bilirubin Total: 0.8 mg/dL (ref 0.0–1.2)
CALCIUM: 9.7 mg/dL (ref 8.7–10.2)
CO2: 25 mmol/L (ref 18–29)
Chloride: 101 mmol/L (ref 96–106)
Creatinine, Ser: 0.82 mg/dL (ref 0.76–1.27)
GFR calc Af Amer: 117 mL/min/{1.73_m2} (ref >59)
GFR, EST NON AFRICAN AMERICAN: 101 mL/min/{1.73_m2} (ref >59)
GLUCOSE: 97 mg/dL (ref 65–99)
Globulin, Total: 2.2 g/dL (ref 1.5–4.5)
POTASSIUM: 4.2 mmol/L (ref 3.5–5.2)
Sodium: 146 mmol/L — ABNORMAL HIGH (ref 134–144)
TOTAL PROTEIN: 6.6 g/dL (ref 6.0–8.5)

## 2015-09-09 ENCOUNTER — Other Ambulatory Visit: Payer: Self-pay | Admitting: Family

## 2015-09-09 DIAGNOSIS — E1165 Type 2 diabetes mellitus with hyperglycemia: Secondary | ICD-10-CM

## 2015-10-18 DIAGNOSIS — H524 Presbyopia: Secondary | ICD-10-CM | POA: Diagnosis not present

## 2015-10-18 DIAGNOSIS — H52223 Regular astigmatism, bilateral: Secondary | ICD-10-CM | POA: Diagnosis not present

## 2015-10-18 DIAGNOSIS — H5213 Myopia, bilateral: Secondary | ICD-10-CM | POA: Diagnosis not present

## 2015-10-18 LAB — HM DIABETES EYE EXAM

## 2015-10-31 ENCOUNTER — Ambulatory Visit (INDEPENDENT_AMBULATORY_CARE_PROVIDER_SITE_OTHER): Payer: BLUE CROSS/BLUE SHIELD | Admitting: Family

## 2015-10-31 ENCOUNTER — Encounter: Payer: Self-pay | Admitting: Family

## 2015-10-31 VITALS — BP 158/93 | HR 84 | Temp 98.7°F | Ht 71.0 in | Wt 253.0 lb

## 2015-10-31 DIAGNOSIS — E1165 Type 2 diabetes mellitus with hyperglycemia: Secondary | ICD-10-CM | POA: Diagnosis not present

## 2015-10-31 DIAGNOSIS — E785 Hyperlipidemia, unspecified: Secondary | ICD-10-CM | POA: Diagnosis not present

## 2015-10-31 DIAGNOSIS — Z794 Long term (current) use of insulin: Secondary | ICD-10-CM | POA: Diagnosis not present

## 2015-10-31 DIAGNOSIS — Z6841 Body Mass Index (BMI) 40.0 and over, adult: Secondary | ICD-10-CM

## 2015-10-31 DIAGNOSIS — I10 Essential (primary) hypertension: Secondary | ICD-10-CM

## 2015-10-31 DIAGNOSIS — Z1211 Encounter for screening for malignant neoplasm of colon: Secondary | ICD-10-CM

## 2015-10-31 LAB — BAYER DCA HB A1C WAIVED: HB A1C (BAYER DCA - WAIVED): 5.8 % (ref <7.0)

## 2015-10-31 NOTE — Patient Instructions (Signed)
Colonoscopy A colonoscopy is an exam to look at the entire large intestine (colon). This exam can help find problems such as tumors, polyps, inflammation, and areas of bleeding. The exam takes about 1 hour.  LET YOUR HEALTH CARE PROVIDER KNOW ABOUT:   Any allergies you have.  All medicines you are taking, including vitamins, herbs, eye drops, creams, and over-the-counter medicines.  Previous problems you or members of your family have had with the use of anesthetics.  Any blood disorders you have.  Previous surgeries you have had.  Medical conditions you have. RISKS AND COMPLICATIONS  Generally, this is a safe procedure. However, as with any procedure, complications can occur. Possible complications include:  Bleeding.  Tearing or rupture of the colon wall.  Reaction to medicines given during the exam.  Infection (rare). BEFORE THE PROCEDURE   Ask your health care provider about changing or stopping your regular medicines.  You may be prescribed an oral bowel prep. This involves drinking a large amount of medicated liquid, starting the day before your procedure. The liquid will cause you to have multiple loose stools until your stool is almost clear or light green. This cleans out your colon in preparation for the procedure.  Do not eat or drink anything else once you have started the bowel prep, unless your health care provider tells you it is safe to do so.  Arrange for someone to drive you home after the procedure. PROCEDURE   You will be given medicine to help you relax (sedative).  You will lie on your side with your knees bent.  A long, flexible tube with a light and camera on the end (colonoscope) will be inserted through the rectum and into the colon. The camera sends video back to a computer screen as it moves through the colon. The colonoscope also releases carbon dioxide gas to inflate the colon. This helps your health care provider see the area better.  During  the exam, your health care provider may take a small tissue sample (biopsy) to be examined under a microscope if any abnormalities are found.  The exam is finished when the entire colon has been viewed. AFTER THE PROCEDURE   Do not drive for 24 hours after the exam.  You may have a small amount of blood in your stool.  You may pass moderate amounts of gas and have mild abdominal cramping or bloating. This is caused by the gas used to inflate your colon during the exam.  Ask when your test results will be ready and how you will get your results. Make sure you get your test results.   This information is not intended to replace advice given to you by your health care provider. Make sure you discuss any questions you have with your health care provider.   Document Released: 01/31/2000 Document Revised: 11/23/2012 Document Reviewed: 10/10/2012 Elsevier Interactive Patient Education 2016 [employer]  

## 2015-10-31 NOTE — Progress Notes (Signed)
Subjective:    Patient ID: Gregory Carey, male    DOB: April 21, 1960, 55 y.o.   MRN: [id number]  PT presents to the office today for chronic follow up. Pt states his BP is elevated, but is going through a great deal of stress at work.  Diabetes  He presents for his follow-up diabetic visit. He has type 2 diabetes mellitus. His disease course has been improving. There are no hypoglycemic associated symptoms. Pertinent negatives for hypoglycemia include no confusion or headaches. Pertinent negatives for diabetes include no blurred vision, no foot paresthesias, no foot ulcerations and no visual change. There are no hypoglycemic complications. Symptoms are improving. Pertinent negatives for diabetic complications include no CVA, heart disease, nephropathy, peripheral neuropathy or retinopathy. Risk factors for coronary artery disease include dyslipidemia, obesity, male sex, family history and sedentary lifestyle. Current diabetic treatment includes diet and oral agent (dual therapy). He is compliant with treatment all of the time. He participates in exercise daily. His breakfast blood glucose range is generally 90-110 mg/dl. Eye exam is current.  Hyperlipidemia  This is a chronic problem. The current episode started more than 1 year ago. The problem is controlled. Recent lipid tests were reviewed and are normal. Exacerbating diseases include diabetes and obesity. Pertinent negatives include no shortness of breath. Current antihyperlipidemic treatment includes statins. The current treatment provides moderate improvement of lipids. Risk factors for coronary artery disease include dyslipidemia, diabetes mellitus, family history, male sex, obesity and a sedentary lifestyle.  Hypertension  This is a chronic problem. The current episode started more than 1 year ago. The problem has been waxing and waning since onset. The problem is uncontrolled. Pertinent negatives include no blurred vision, headaches,  malaise/fatigue, peripheral edema or shortness of breath. Risk factors for coronary artery disease include diabetes mellitus, dyslipidemia, obesity and male gender. Past treatments include ACE inhibitors. There is no history of kidney disease, CAD/MI, CVA, heart failure, retinopathy or a thyroid problem.      Review of Systems  Constitutional: Negative.  Negative for malaise/fatigue.  HENT: Negative.   Eyes: Negative for blurred vision.  Respiratory: Negative.  Negative for shortness of breath.   Cardiovascular: Negative.   Gastrointestinal: Negative.   Endocrine: Negative.   Genitourinary: Negative.   Musculoskeletal: Negative.   Neurological: Negative.  Negative for headaches.  Hematological: Negative.   Psychiatric/Behavioral: Negative.  Negative for confusion.  All other systems reviewed and are negative.      Objective:   Physical Exam  Constitutional: He is oriented to person, place, and time. He appears well-developed and well-nourished. No distress.  HENT:  Head: Normocephalic.  Eyes: Pupils are equal, round, and reactive to light. Right eye exhibits no discharge. Left eye exhibits no discharge.  Neck: Normal range of motion. Neck supple. No thyromegaly present.  Cardiovascular: Normal rate, regular rhythm, normal heart sounds and intact distal pulses.   No murmur heard. Pulmonary/Chest: Effort normal and breath sounds normal. No respiratory distress. He has no wheezes.  Abdominal: Soft. Bowel sounds are normal. He exhibits no distension. There is no tenderness.  Musculoskeletal: Normal range of motion. He exhibits no edema or tenderness.  Neurological: He is alert and oriented to person, place, and time. He has normal reflexes. No cranial nerve deficit.  Skin: Skin is warm and dry. No rash noted. No erythema.  Psychiatric: He has a normal mood and affect. His behavior is normal. Judgment and thought content normal.  Vitals reviewed.     BP (!) 158/93 (BP Location:  Right Arm, Patient Position: Sitting, Cuff Size: Large)   Pulse 84   Temp 98.7 F (37.1 C) (Oral)   Ht 5' 11"  (1.803 m)   Wt 253 lb (114.8 kg)   BMI 35.29 kg/m      Assessment & Plan:  1. Essential hypertension - CMP14+EGFR  2. Type 2 diabetes mellitus with hyperglycemia, with long-term current use of insulin (HCC) - Bayer DCA Hb A1c Waived - CMP14+EGFR  3. Hyperlipidemia - CMP14+EGFR - Lipid panel  4. Morbid obesity with BMI of 40.0-44.9, adult (HCC) - CMP14+EGFR  5. Colon cancer screening - CMP14+EGFR - Fecal occult blood, imunochemical; Future   Continue all meds Labs pending Health Maintenance reviewed Diet and exercise encouraged RTO 3 month, pt to come back have BP rechecked in next 2-3 weeks after stress at work decreases  Evelina Dun, FNP

## 2015-11-01 LAB — CMP14+EGFR
ALK PHOS: 57 IU/L (ref 39–117)
ALT: 16 IU/L (ref 0–44)
AST: 16 IU/L (ref 0–40)
Albumin/Globulin Ratio: 1.9 (ref 1.2–2.2)
Albumin: 4.3 g/dL (ref 3.5–5.5)
BUN/Creatinine Ratio: 16 (ref 9–20)
BUN: 14 mg/dL (ref 6–24)
Bilirubin Total: 0.6 mg/dL (ref 0.0–1.2)
CALCIUM: 9.8 mg/dL (ref 8.7–10.2)
CO2: 25 mmol/L (ref 18–29)
Chloride: 105 mmol/L (ref 96–106)
Creatinine, Ser: 0.9 mg/dL (ref 0.76–1.27)
GFR calc Af Amer: 112 mL/min/{1.73_m2} (ref >59)
GFR, EST NON AFRICAN AMERICAN: 96 mL/min/{1.73_m2} (ref >59)
Globulin, Total: 2.3 g/dL (ref 1.5–4.5)
Glucose: 99 mg/dL (ref 65–99)
POTASSIUM: 4 mmol/L (ref 3.5–5.2)
Sodium: 145 mmol/L — ABNORMAL HIGH (ref 134–144)
Total Protein: 6.6 g/dL (ref 6.0–8.5)

## 2015-11-01 LAB — LIPID PANEL
CHOL/HDL RATIO: 2.3 ratio (ref 0.0–5.0)
CHOLESTEROL TOTAL: 82 mg/dL — AB (ref 100–199)
HDL: 36 mg/dL — AB (ref >39)
LDL Calculated: 32 mg/dL (ref 0–99)
TRIGLYCERIDES: 70 mg/dL (ref 0–149)
VLDL Cholesterol Cal: 14 mg/dL (ref 5–40)

## 2015-11-03 ENCOUNTER — Other Ambulatory Visit: Payer: Self-pay | Admitting: Family

## 2015-11-03 MED ORDER — DULAGLUTIDE 0.75 MG/0.5ML ~~LOC~~ SOAJ: 0.7500 mg | SUBCUTANEOUS | 3 refills | Status: DC

## 2015-11-07 ENCOUNTER — Telehealth: Payer: Self-pay | Admitting: Family

## 2015-11-07 NOTE — Telephone Encounter (Signed)
Patient aware of lab results.

## 2015-11-08 ENCOUNTER — Telehealth: Payer: Self-pay

## 2015-11-08 NOTE — Telephone Encounter (Signed)
This was decreased because blood sugars were doing so good! Pt's last HgbA1C was 5.7

## 2015-11-08 NOTE — Telephone Encounter (Signed)
Aware of dosage change

## 2015-11-19 ENCOUNTER — Other Ambulatory Visit: Payer: Self-pay | Admitting: Family

## 2015-11-19 DIAGNOSIS — I10 Essential (primary) hypertension: Secondary | ICD-10-CM

## 2015-12-18 DIAGNOSIS — D485 Neoplasm of uncertain behavior of skin: Secondary | ICD-10-CM | POA: Diagnosis not present

## 2015-12-18 DIAGNOSIS — C4440 Unspecified malignant neoplasm of skin of scalp and neck: Secondary | ICD-10-CM | POA: Diagnosis not present

## 2015-12-18 DIAGNOSIS — Z85828 Personal history of other malignant neoplasm of skin: Secondary | ICD-10-CM | POA: Diagnosis not present

## 2015-12-18 DIAGNOSIS — C44629 Squamous cell carcinoma of skin of left upper limb, including shoulder: Secondary | ICD-10-CM | POA: Diagnosis not present

## 2015-12-18 DIAGNOSIS — L821 Other seborrheic keratosis: Secondary | ICD-10-CM | POA: Diagnosis not present

## 2015-12-18 DIAGNOSIS — L814 Other melanin hyperpigmentation: Secondary | ICD-10-CM | POA: Diagnosis not present

## 2015-12-18 DIAGNOSIS — L570 Actinic keratosis: Secondary | ICD-10-CM | POA: Diagnosis not present

## 2015-12-18 DIAGNOSIS — D1801 Hemangioma of skin and subcutaneous tissue: Secondary | ICD-10-CM | POA: Diagnosis not present

## 2016-01-14 DIAGNOSIS — D0462 Carcinoma in situ of skin of left upper limb, including shoulder: Secondary | ICD-10-CM | POA: Diagnosis not present

## 2016-01-18 ENCOUNTER — Other Ambulatory Visit: Payer: BLUE CROSS/BLUE SHIELD

## 2016-01-18 DIAGNOSIS — Z1211 Encounter for screening for malignant neoplasm of colon: Secondary | ICD-10-CM | POA: Diagnosis not present

## 2016-01-20 LAB — FECAL OCCULT BLOOD, IMMUNOCHEMICAL: Fecal Occult Bld: POSITIVE — AB

## 2016-01-21 ENCOUNTER — Other Ambulatory Visit: Payer: Self-pay | Admitting: *Deleted

## 2016-01-21 DIAGNOSIS — K921 Melena: Secondary | ICD-10-CM

## 2016-01-23 ENCOUNTER — Encounter: Payer: Self-pay | Admitting: Family

## 2016-01-23 ENCOUNTER — Ambulatory Visit (INDEPENDENT_AMBULATORY_CARE_PROVIDER_SITE_OTHER): Payer: BLUE CROSS/BLUE SHIELD | Admitting: Family

## 2016-01-23 VITALS — BP 152/95 | HR 79 | Temp 98.6°F | Ht 71.0 in | Wt 242.0 lb

## 2016-01-23 DIAGNOSIS — E785 Hyperlipidemia, unspecified: Secondary | ICD-10-CM | POA: Diagnosis not present

## 2016-01-23 DIAGNOSIS — Z6841 Body Mass Index (BMI) 40.0 and over, adult: Secondary | ICD-10-CM

## 2016-01-23 DIAGNOSIS — Z0000 Encounter for general adult medical examination without abnormal findings: Secondary | ICD-10-CM | POA: Diagnosis not present

## 2016-01-23 DIAGNOSIS — I10 Essential (primary) hypertension: Secondary | ICD-10-CM

## 2016-01-23 DIAGNOSIS — E1165 Type 2 diabetes mellitus with hyperglycemia: Secondary | ICD-10-CM | POA: Diagnosis not present

## 2016-01-23 DIAGNOSIS — Z794 Long term (current) use of insulin: Secondary | ICD-10-CM | POA: Diagnosis not present

## 2016-01-23 LAB — BAYER DCA HB A1C WAIVED: HB A1C (BAYER DCA - WAIVED): 5.8 % (ref <7.0)

## 2016-01-23 MED ORDER — LISINOPRIL-HYDROCHLOROTHIAZIDE 20-12.5 MG PO TABS: 1.0000 | ORAL_TABLET | Freq: Every day | ORAL | 1 refills | Status: DC

## 2016-01-23 NOTE — Patient Instructions (Signed)

## 2016-01-23 NOTE — Progress Notes (Signed)
Subjective:    Patient ID: Gregory Carey, male    DOB: 1960-10-22, 55 y.o.   MRN: [id number]  PT presents to the office today for CPE. Pt states his BP is elevated, but is going through a great deal of stress at work.  Diabetes  He presents for his follow-up diabetic visit. He has type 2 diabetes mellitus. His disease course has been improving. There are no hypoglycemic associated symptoms. Pertinent negatives for hypoglycemia include no confusion or headaches. Pertinent negatives for diabetes include no blurred vision, no foot paresthesias, no foot ulcerations and no visual change. There are no hypoglycemic complications. Symptoms are improving. Pertinent negatives for diabetic complications include no CVA, heart disease, nephropathy, peripheral neuropathy or retinopathy. Risk factors for coronary artery disease include dyslipidemia, obesity, male sex, family history and sedentary lifestyle. Current diabetic treatment includes diet and oral agent (dual therapy). He is compliant with treatment all of the time. He participates in exercise daily. His breakfast blood glucose range is generally 90-110 mg/dl. Eye exam is current.  Hyperlipidemia  This is a chronic problem. The current episode started more than 1 year ago. The problem is controlled. Recent lipid tests were reviewed and are normal. Exacerbating diseases include diabetes and obesity. Pertinent negatives include no shortness of breath. Current antihyperlipidemic treatment includes statins. The current treatment provides moderate improvement of lipids. Risk factors for coronary artery disease include dyslipidemia, diabetes mellitus, family history, male sex, obesity and a sedentary lifestyle.  Hypertension  This is a chronic problem. The current episode started more than 1 year ago. The problem has been waxing and waning since onset. The problem is uncontrolled. Pertinent negatives include no blurred vision, headaches, malaise/fatigue,  peripheral edema or shortness of breath. Risk factors for coronary artery disease include diabetes mellitus, dyslipidemia, obesity and male gender. Past treatments include ACE inhibitors. There is no history of kidney disease, CAD/MI, CVA, heart failure, retinopathy or a thyroid problem.      Review of Systems  Constitutional: Negative.  Negative for malaise/fatigue.  HENT: Negative.   Eyes: Negative for blurred vision.  Respiratory: Negative.  Negative for shortness of breath.   Cardiovascular: Negative.   Gastrointestinal: Negative.   Endocrine: Negative.   Genitourinary: Negative.   Musculoskeletal: Negative.   Neurological: Negative.  Negative for headaches.  Hematological: Negative.   Psychiatric/Behavioral: Negative.  Negative for confusion.  All other systems reviewed and are negative.      Objective:   Physical Exam  Constitutional: He is oriented to person, place, and time. He appears well-developed and well-nourished. No distress.  HENT:  Head: Normocephalic.  Right Ear: External ear normal.  Left Ear: External ear normal.  Nose: Nose normal.  Mouth/Throat: Oropharynx is clear and moist.  Eyes: Pupils are equal, round, and reactive to light. Right eye exhibits no discharge. Left eye exhibits no discharge.  Neck: Normal range of motion. Neck supple. No thyromegaly present.  Cardiovascular: Normal rate, regular rhythm, normal heart sounds and intact distal pulses.   No murmur heard. Pulmonary/Chest: Effort normal and breath sounds normal. No respiratory distress. He has no wheezes.  Abdominal: Soft. Bowel sounds are normal. He exhibits no distension. There is no tenderness.  Musculoskeletal: Normal range of motion. He exhibits no edema or tenderness.  Neurological: He is alert and oriented to person, place, and time. He has normal reflexes. No cranial nerve deficit.  Skin: Skin is warm and dry. No rash noted. No erythema.  Psychiatric: He has a normal mood and affect.  His behavior is normal. Judgment and thought content normal.  Vitals reviewed.     BP (!) 152/95   Pulse 79   Temp 98.6 F (37 C) (Oral)   Ht 5' 11"  (1.803 m)   Wt 242 lb (109.8 kg)   BMI 33.75 kg/m      Assessment & Plan:  1. Essential hypertension -Added HCTZ 12.5 mg today -Daily blood pressure log given with instructions on how to fill out and told to bring to next visit -Dash diet information given -Exercise encouraged - Stress Management  -Continue current meds -RTO in 2 weeks - CMP14+EGFR - lisinopril-hydrochlorothiazide (ZESTORETIC) 20-12.5 MG tablet; Take 1 tablet by mouth daily.  Dispense: 90 tablet; Refill: 1  2. Type 2 diabetes mellitus with hyperglycemia, with long-term current use of insulin (HCC) - Bayer DCA Hb A1c Waived - CMP14+EGFR  3. Hyperlipidemia, unspecified hyperlipidemia type - CMP14+EGFR - Lipid panel  4. Morbid obesity with BMI of 40.0-44.9, adult (HCC) - CMP14+EGFR  5. Annual physical exam - Bayer DCA Hb A1c Waived - CMP14+EGFR - Lipid panel - PSA, total and free - CBC with Differential/Platelet - Thyroid Panel With TSH - VITAMIN D 25 Hydroxy (Vit-D Deficiency, Fractures)   Continue all meds Labs pending Health Maintenance reviewed Diet and exercise encouraged RTO 2 weeks   Evelina Dun, FNP

## 2016-01-24 LAB — CMP14+EGFR
ALK PHOS: 59 IU/L (ref 39–117)
ALT: 20 IU/L (ref 0–44)
AST: 18 IU/L (ref 0–40)
Albumin/Globulin Ratio: 2.1 (ref 1.2–2.2)
Albumin: 4.5 g/dL (ref 3.5–5.5)
BUN/Creatinine Ratio: 18 (ref 9–20)
BUN: 16 mg/dL (ref 6–24)
Bilirubin Total: 0.6 mg/dL (ref 0.0–1.2)
CHLORIDE: 101 mmol/L (ref 96–106)
CO2: 25 mmol/L (ref 18–29)
CREATININE: 0.9 mg/dL (ref 0.76–1.27)
Calcium: 9.5 mg/dL (ref 8.7–10.2)
GFR calc Af Amer: 112 mL/min/{1.73_m2} (ref >59)
GFR calc non Af Amer: 96 mL/min/{1.73_m2} (ref >59)
GLUCOSE: 92 mg/dL (ref 65–99)
Globulin, Total: 2.1 g/dL (ref 1.5–4.5)
Potassium: 3.8 mmol/L (ref 3.5–5.2)
Sodium: 143 mmol/L (ref 134–144)
Total Protein: 6.6 g/dL (ref 6.0–8.5)

## 2016-01-24 LAB — CBC WITH DIFFERENTIAL/PLATELET
BASOS ABS: 0.1 10*3/uL (ref 0.0–0.2)
BASOS: 1 %
EOS (ABSOLUTE): 0.3 10*3/uL (ref 0.0–0.4)
Eos: 3 %
HEMOGLOBIN: 15.5 g/dL (ref 13.0–17.7)
Hematocrit: 44.4 % (ref 37.5–51.0)
IMMATURE GRANS (ABS): 0 10*3/uL (ref 0.0–0.1)
IMMATURE GRANULOCYTES: 0 %
LYMPHS: 35 %
Lymphocytes Absolute: 3.3 10*3/uL — ABNORMAL HIGH (ref 0.7–3.1)
MCH: 28.6 pg (ref 26.6–33.0)
MCHC: 34.9 g/dL (ref 31.5–35.7)
MCV: 82 fL (ref 79–97)
MONOCYTES: 7 %
Monocytes Absolute: 0.6 10*3/uL (ref 0.1–0.9)
NEUTROS ABS: 5 10*3/uL (ref 1.4–7.0)
NEUTROS PCT: 54 %
PLATELETS: 254 10*3/uL (ref 150–379)
RBC: 5.42 x10E6/uL (ref 4.14–5.80)
RDW: 14.6 % (ref 12.3–15.4)
WBC: 9.2 10*3/uL (ref 3.4–10.8)

## 2016-01-24 LAB — THYROID PANEL WITH TSH
FREE THYROXINE INDEX: 1.7 (ref 1.2–4.9)
T3 UPTAKE RATIO: 29 % (ref 24–39)
T4, Total: 5.9 ug/dL (ref 4.5–12.0)
TSH: 0.683 u[IU]/mL (ref 0.450–4.500)

## 2016-01-24 LAB — PSA, TOTAL AND FREE
PSA, Free Pct: 27.5 %
PSA, Free: 0.11 ng/mL
Prostate Specific Ag, Serum: 0.4 ng/mL (ref 0.0–4.0)

## 2016-01-24 LAB — LIPID PANEL
CHOLESTEROL TOTAL: 95 mg/dL — AB (ref 100–199)
Chol/HDL Ratio: 2.3 ratio units (ref 0.0–5.0)
HDL: 41 mg/dL (ref >39)
LDL CALC: 39 mg/dL (ref 0–99)
TRIGLYCERIDES: 77 mg/dL (ref 0–149)
VLDL CHOLESTEROL CAL: 15 mg/dL (ref 5–40)

## 2016-01-24 LAB — VITAMIN D 25 HYDROXY (VIT D DEFICIENCY, FRACTURES): VIT D 25 HYDROXY: 33.5 ng/mL (ref 30.0–100.0)

## 2016-01-28 DIAGNOSIS — L570 Actinic keratosis: Secondary | ICD-10-CM | POA: Diagnosis not present

## 2016-01-30 DIAGNOSIS — C44329 Squamous cell carcinoma of skin of other parts of face: Secondary | ICD-10-CM | POA: Diagnosis not present

## 2016-05-12 ENCOUNTER — Other Ambulatory Visit (INDEPENDENT_AMBULATORY_CARE_PROVIDER_SITE_OTHER): Payer: BLUE CROSS/BLUE SHIELD

## 2016-05-12 DIAGNOSIS — Z1211 Encounter for screening for malignant neoplasm of colon: Secondary | ICD-10-CM

## 2016-05-14 LAB — FECAL OCCULT BLOOD, IMMUNOCHEMICAL: FECAL OCCULT BLD: NEGATIVE

## 2016-06-17 DIAGNOSIS — C44622 Squamous cell carcinoma of skin of right upper limb, including shoulder: Secondary | ICD-10-CM | POA: Diagnosis not present

## 2016-06-17 DIAGNOSIS — L821 Other seborrheic keratosis: Secondary | ICD-10-CM | POA: Diagnosis not present

## 2016-06-17 DIAGNOSIS — L814 Other melanin hyperpigmentation: Secondary | ICD-10-CM | POA: Diagnosis not present

## 2016-06-17 DIAGNOSIS — Z85828 Personal history of other malignant neoplasm of skin: Secondary | ICD-10-CM | POA: Diagnosis not present

## 2016-06-17 DIAGNOSIS — D1801 Hemangioma of skin and subcutaneous tissue: Secondary | ICD-10-CM | POA: Diagnosis not present

## 2016-06-17 DIAGNOSIS — L570 Actinic keratosis: Secondary | ICD-10-CM | POA: Diagnosis not present

## 2016-06-17 DIAGNOSIS — C44329 Squamous cell carcinoma of skin of other parts of face: Secondary | ICD-10-CM | POA: Diagnosis not present

## 2016-06-17 DIAGNOSIS — D485 Neoplasm of uncertain behavior of skin: Secondary | ICD-10-CM | POA: Diagnosis not present

## 2016-06-29 DIAGNOSIS — C44622 Squamous cell carcinoma of skin of right upper limb, including shoulder: Secondary | ICD-10-CM | POA: Diagnosis not present

## 2016-06-29 DIAGNOSIS — C44329 Squamous cell carcinoma of skin of other parts of face: Secondary | ICD-10-CM | POA: Diagnosis not present

## 2016-06-29 DIAGNOSIS — D0461 Carcinoma in situ of skin of right upper limb, including shoulder: Secondary | ICD-10-CM | POA: Diagnosis not present

## 2016-07-24 ENCOUNTER — Other Ambulatory Visit: Payer: Self-pay | Admitting: Family

## 2016-07-24 DIAGNOSIS — I10 Essential (primary) hypertension: Secondary | ICD-10-CM

## 2016-08-01 ENCOUNTER — Other Ambulatory Visit: Payer: Self-pay | Admitting: Family

## 2016-08-01 DIAGNOSIS — E1165 Type 2 diabetes mellitus with hyperglycemia: Secondary | ICD-10-CM

## 2016-08-03 ENCOUNTER — Other Ambulatory Visit: Payer: Self-pay | Admitting: Family

## 2016-08-03 DIAGNOSIS — E1165 Type 2 diabetes mellitus with hyperglycemia: Secondary | ICD-10-CM

## 2016-10-30 ENCOUNTER — Encounter: Payer: Self-pay | Admitting: Family

## 2016-10-30 ENCOUNTER — Ambulatory Visit (INDEPENDENT_AMBULATORY_CARE_PROVIDER_SITE_OTHER): Payer: BLUE CROSS/BLUE SHIELD | Admitting: Family

## 2016-10-30 VITALS — BP 174/99 | HR 69 | Temp 98.1°F | Ht 71.0 in | Wt 248.0 lb

## 2016-10-30 DIAGNOSIS — E785 Hyperlipidemia, unspecified: Secondary | ICD-10-CM | POA: Diagnosis not present

## 2016-10-30 DIAGNOSIS — Z6841 Body Mass Index (BMI) 40.0 and over, adult: Secondary | ICD-10-CM

## 2016-10-30 DIAGNOSIS — I10 Essential (primary) hypertension: Secondary | ICD-10-CM | POA: Diagnosis not present

## 2016-10-30 DIAGNOSIS — Z794 Long term (current) use of insulin: Secondary | ICD-10-CM | POA: Diagnosis not present

## 2016-10-30 DIAGNOSIS — E1165 Type 2 diabetes mellitus with hyperglycemia: Secondary | ICD-10-CM | POA: Diagnosis not present

## 2016-10-30 LAB — BAYER DCA HB A1C WAIVED: HB A1C: 5.7 % (ref <7.0)

## 2016-10-30 MED ORDER — LISINOPRIL-HYDROCHLOROTHIAZIDE 20-12.5 MG PO TABS: 1.0000 | ORAL_TABLET | Freq: Every day | ORAL | 0 refills | Status: DC

## 2016-10-30 MED ORDER — ROSUVASTATIN CALCIUM 20 MG PO TABS: 20.0000 mg | ORAL_TABLET | Freq: Every day | ORAL | 0 refills | Status: DC

## 2016-10-30 MED ORDER — METFORMIN HCL ER 750 MG PO TB24: 750.0000 mg | ORAL_TABLET | Freq: Every day | ORAL | 1 refills | Status: DC

## 2016-10-30 MED ORDER — DULAGLUTIDE 0.75 MG/0.5ML ~~LOC~~ SOAJ: 0.7500 mg | SUBCUTANEOUS | 3 refills | Status: DC

## 2016-10-30 NOTE — Progress Notes (Signed)
Subjective:    Patient ID: Gregory Carey, male    DOB: Mar 01, 1960, 56 y.o.   MRN: [id number]  Pt presents to the office today for chronic follow up. PT states he has "ran out of medication two days ago". PT's BP is elevated.  Hypertension  This is a chronic problem. The current episode started more than 1 year ago. The problem has been waxing and waning since onset. The problem is uncontrolled. Pertinent negatives include no blurred vision, headaches, malaise/fatigue, peripheral edema or shortness of breath. Risk factors for coronary artery disease include diabetes mellitus, dyslipidemia, obesity, male gender, sedentary lifestyle and family history. Improvement on treatment: pt has not had medication in 2 days. There is no history of kidney disease, CAD/MI, CVA or heart failure.  Diabetes  He presents for his follow-up diabetic visit. He has type 2 diabetes mellitus. His disease course has been stable. There are no hypoglycemic associated symptoms. Pertinent negatives for hypoglycemia include no headaches. There are no diabetic associated symptoms. Pertinent negatives for diabetes include no blurred vision, no fatigue and no foot ulcerations. Symptoms are stable. Pertinent negatives for diabetic complications include no CVA, heart disease, nephropathy or peripheral neuropathy. Risk factors for coronary artery disease include dyslipidemia, diabetes mellitus, obesity, male sex and sedentary lifestyle. He is following a generally healthy diet. His breakfast blood glucose range is generally 110-130 mg/dl. Eye exam is not current.  Hyperlipidemia  This is a chronic problem. The current episode started more than 1 year ago. The problem is controlled. Recent lipid tests were reviewed and are normal. Exacerbating diseases include obesity. Pertinent negatives include no shortness of breath. Current antihyperlipidemic treatment includes statins. The current treatment provides moderate improvement of lipids. Risk  factors for coronary artery disease include dyslipidemia, diabetes mellitus, family history, obesity, male sex, hypertension and a sedentary lifestyle.      Review of Systems  Constitutional: Negative for fatigue and malaise/fatigue.  Eyes: Negative for blurred vision.  Respiratory: Negative for shortness of breath.   Neurological: Negative for headaches.  All other systems reviewed and are negative.      Objective:   Physical Exam  Constitutional: He is oriented to person, place, and time. He appears well-developed and well-nourished. No distress.  HENT:  Head: Normocephalic.  Right Ear: External ear normal.  Left Ear: External ear normal.  Nose: Nose normal.  Mouth/Throat: Oropharynx is clear and moist.  Eyes: Pupils are equal, round, and reactive to light. Right eye exhibits no discharge. Left eye exhibits no discharge.  Neck: Normal range of motion. Neck supple. No thyromegaly present.  Cardiovascular: Normal rate, regular rhythm, normal heart sounds and intact distal pulses.   No murmur heard. Pulmonary/Chest: Effort normal and breath sounds normal. No respiratory distress. He has no wheezes.  Abdominal: Soft. Bowel sounds are normal. He exhibits no distension. There is no tenderness.  Musculoskeletal: Normal range of motion. He exhibits no edema or tenderness.  Neurological: He is alert and oriented to person, place, and time.  Skin: Skin is warm and dry. No rash noted. No erythema.  Psychiatric: He has a normal mood and affect. His behavior is normal. Judgment and thought content normal.  Vitals reviewed.  Diabetic Foot Exam - Simple   Simple Foot Form Diabetic Foot exam was performed with the following findings:  Yes 10/30/2016 10:42 AM  Visual Inspection No deformities, no ulcerations, no other skin breakdown bilaterally:  Yes Sensation Testing Intact to touch and monofilament testing bilaterally:  Yes Pulse Check  Posterior Tibialis and Dorsalis pulse intact  bilaterally:  Yes Comments       BP (!) 174/99   Pulse 69   Temp 98.1 F (36.7 C) (Oral)   Ht 5' 11"  (1.803 m)   Wt 248 lb (112.5 kg)   BMI 34.59 kg/m      Assessment & Plan:  1. Essential hypertension Will reorder Zestoretic  Low salt diet RTo in 2 weeks to recheck - CMP14+EGFR - lisinopril-hydrochlorothiazide (PRINZIDE,ZESTORETIC) 20-12.5 MG tablet; Take 1 tablet by mouth daily.  Dispense: 90 tablet; Refill: 0  2. Type 2 diabetes mellitus with hyperglycemia, with long-term current use of insulin (HCC) Change Metfromin to XR from BID dosing - Bayer DCA Hb A1c Waived - CMP14+EGFR - Microalbumin / creatinine urine ratio - Dulaglutide (TRULICITY) 6.96 EX/5.2WU SOPN; Inject 0.75 mg into the skin once a week.  Dispense: 12 pen; Refill: 3 - metFORMIN (GLUCOPHAGE XR) 750 MG 24 hr tablet; Take 1 tablet (750 mg total) by mouth daily with breakfast.  Dispense: 90 tablet; Refill: 1 - Ambulatory referral to Ophthalmology  3. Morbid obesity with BMI of 40.0-44.9, adult ([place]) - CMP14+EGFR  4. Hyperlipidemia, unspecified hyperlipidemia type - CMP14+EGFR - Lipid panel - rosuvastatin (CRESTOR) 20 MG tablet; Take 1 tablet (20 mg total) by mouth daily.  Dispense: 90 tablet; Refill: 0   Continue all meds Labs pending Health Maintenance reviewed Diet and exercise encouraged RTO 2 weeks to recheck HTN  Evelina Dun, FNP

## 2016-10-30 NOTE — Patient Instructions (Signed)
Diabetes Mellitus and Food It is important for you to manage your blood sugar (glucose) level. Your blood glucose level can be greatly affected by what you eat. Eating healthier foods in the appropriate amounts throughout the day at about the same time each day will help you control your blood glucose level. It can also help slow or prevent worsening of your diabetes mellitus. Healthy eating may even help you improve the level of your blood pressure and reach or maintain a healthy weight. General recommendations for healthful eating and cooking habits include:  Eating meals and snacks regularly. Avoid going long periods of time without eating to lose weight.  Eating a diet that consists mainly of plant-based foods, such as fruits, vegetables, nuts, legumes, and whole grains.  Using low-heat cooking methods, such as baking, instead of high-heat cooking methods, such as deep frying.  Work with your dietitian to make sure you understand how to use the Nutrition Facts information on food labels. How can food affect me? Carbohydrates Carbohydrates affect your blood glucose level more than any other type of food. Your dietitian will help you determine how many carbohydrates to eat at each meal and teach you how to count carbohydrates. Counting carbohydrates is important to keep your blood glucose at a healthy level, especially if you are using insulin or taking certain medicines for diabetes mellitus. Alcohol Alcohol can cause sudden decreases in blood glucose (hypoglycemia), especially if you use insulin or take certain medicines for diabetes mellitus. Hypoglycemia can be a life-threatening condition. Symptoms of hypoglycemia (sleepiness, dizziness, and disorientation) are similar to symptoms of having too much alcohol. If your health care provider has given you approval to drink alcohol, do so in moderation and use the following guidelines:  Women should not have more than one drink per day, and men  should not have more than two drinks per day. One drink is equal to: ? 12 oz of beer. ? 5 oz of wine. ? 1 oz of hard liquor.  Do not drink on an empty stomach.  Keep yourself hydrated. Have water, diet soda, or unsweetened iced tea.  Regular soda, juice, and other mixers might contain a lot of carbohydrates and should be counted.  What foods are not recommended? As you make food choices, it is important to remember that all foods are not the same. Some foods have fewer nutrients per serving than other foods, even though they might have the same number of calories or carbohydrates. It is difficult to get your body what it needs when you eat foods with fewer nutrients. Examples of foods that you should avoid that are high in calories and carbohydrates but low in nutrients include:  Trans fats (most processed foods list trans fats on the Nutrition Facts label).  Regular soda.  Juice.  Candy.  Sweets, such as cake, pie, doughnuts, and cookies.  Fried foods.  What foods can I eat? Eat nutrient-rich foods, which will nourish your body and keep you healthy. The food you should eat also will depend on several factors, including:  The calories you need.  The medicines you take.  Your weight.  Your blood glucose level.  Your blood pressure level.  Your cholesterol level.  You should eat a variety of foods, including:  Protein. ? Lean cuts of meat. ? Proteins low in saturated fats, such as fish, egg whites, and beans. Avoid processed meats.  Fruits and vegetables. ? Fruits and vegetables that may help control blood glucose levels, such as apples,   mangoes, and yams.  Dairy products. ? Choose fat-free or low-fat dairy products, such as milk, yogurt, and cheese.  Grains, bread, pasta, and rice. ? Choose whole grain products, such as multigrain bread, whole oats, and brown rice. These foods may help control blood pressure.  Fats. ? Foods containing healthful fats, such as  nuts, avocado, olive oil, canola oil, and fish.  Does everyone with diabetes mellitus have the same meal plan? Because every person with diabetes mellitus is different, there is not one meal plan that works for everyone. It is very important that you meet with a dietitian who will help you create a meal plan that is just right for you. This information is not intended to replace advice given to you by your health care provider. Make sure you discuss any questions you have with your health care provider. Document Released: 10/30/2004 Document Revised: 07/11/2015 Document Reviewed: 12/30/2012 Elsevier Interactive Patient Education  2017 [employer]  

## 2016-10-31 LAB — CMP14+EGFR
ALT: 20 IU/L (ref 0–44)
AST: 21 IU/L (ref 0–40)
Albumin/Globulin Ratio: 2.7 — ABNORMAL HIGH (ref 1.2–2.2)
Albumin: 4.9 g/dL (ref 3.5–5.5)
Alkaline Phosphatase: 55 IU/L (ref 39–117)
BILIRUBIN TOTAL: 0.7 mg/dL (ref 0.0–1.2)
BUN/Creatinine Ratio: 22 — ABNORMAL HIGH (ref 9–20)
BUN: 17 mg/dL (ref 6–24)
CHLORIDE: 103 mmol/L (ref 96–106)
CO2: 23 mmol/L (ref 20–29)
Calcium: 9.7 mg/dL (ref 8.7–10.2)
Creatinine, Ser: 0.79 mg/dL (ref 0.76–1.27)
GFR calc non Af Amer: 101 mL/min/{1.73_m2} (ref >59)
GFR, EST AFRICAN AMERICAN: 117 mL/min/{1.73_m2} (ref >59)
GLUCOSE: 90 mg/dL (ref 65–99)
Globulin, Total: 1.8 g/dL (ref 1.5–4.5)
Potassium: 4.4 mmol/L (ref 3.5–5.2)
Sodium: 143 mmol/L (ref 134–144)
TOTAL PROTEIN: 6.7 g/dL (ref 6.0–8.5)

## 2016-10-31 LAB — LIPID PANEL
Chol/HDL Ratio: 2.5 ratio (ref 0.0–5.0)
Cholesterol, Total: 94 mg/dL — ABNORMAL LOW (ref 100–199)
HDL: 38 mg/dL — AB (ref >39)
LDL CALC: 41 mg/dL (ref 0–99)
Triglycerides: 76 mg/dL (ref 0–149)
VLDL CHOLESTEROL CAL: 15 mg/dL (ref 5–40)

## 2016-10-31 LAB — MICROALBUMIN / CREATININE URINE RATIO
Creatinine, Urine: 47.9 mg/dL
Microalb/Creat Ratio: 159.3 mg/g creat — ABNORMAL HIGH (ref 0.0–30.0)
Microalbumin, Urine: 76.3 ug/mL

## 2016-12-02 DIAGNOSIS — H52223 Regular astigmatism, bilateral: Secondary | ICD-10-CM | POA: Diagnosis not present

## 2016-12-02 DIAGNOSIS — H524 Presbyopia: Secondary | ICD-10-CM | POA: Diagnosis not present

## 2016-12-02 DIAGNOSIS — H5213 Myopia, bilateral: Secondary | ICD-10-CM | POA: Diagnosis not present

## 2016-12-02 LAB — HM DIABETES EYE EXAM

## 2017-01-28 ENCOUNTER — Other Ambulatory Visit: Payer: Self-pay | Admitting: Family

## 2017-01-28 DIAGNOSIS — E785 Hyperlipidemia, unspecified: Secondary | ICD-10-CM

## 2017-01-28 DIAGNOSIS — I10 Essential (primary) hypertension: Secondary | ICD-10-CM

## 2017-03-06 ENCOUNTER — Ambulatory Visit (INDEPENDENT_AMBULATORY_CARE_PROVIDER_SITE_OTHER): Payer: BLUE CROSS/BLUE SHIELD | Admitting: Family Medicine

## 2017-03-06 ENCOUNTER — Encounter: Payer: Self-pay | Admitting: Family Medicine

## 2017-03-06 VITALS — BP 141/87 | HR 73 | Temp 97.5°F | Ht 71.0 in | Wt 260.0 lb

## 2017-03-06 DIAGNOSIS — J111 Influenza due to unidentified influenza virus with other respiratory manifestations: Secondary | ICD-10-CM

## 2017-03-06 DIAGNOSIS — R6889 Other general symptoms and signs: Secondary | ICD-10-CM | POA: Diagnosis not present

## 2017-03-06 MED ORDER — OSELTAMIVIR PHOSPHATE 75 MG PO CAPS: 75.0000 mg | ORAL_CAPSULE | Freq: Two times a day (BID) | ORAL | 0 refills | Status: DC

## 2017-03-06 NOTE — Progress Notes (Signed)
Chief Complaint  Patient presents with  . flu like    congestion, cough, body aches     HPI  Patient presents today for patient presents with dry cough runny stuffy nose. Diffuse headache of moderate intensity. Patient also has chills and subjective fever. Body aches worst in the back but present in the legs, shoulders, and torso as well. Has sapped the energy to the point that of being unable to perform usual activities other than ADLs. Onset 2 days ago.  PMH: Smoking status noted ROS: Per HPI  Objective: BP (!) 141/87   Pulse 73   Temp (!) 97.5 F (36.4 C) (Oral)   Ht 5\' 11"  (1.803 m)   Wt 260 lb (117.9 kg)   BMI 36.26 kg/m  Gen: NAD, alert, cooperative with exam HEENT: NCAT, EOMI, PERRL CV: RRR, good S1/S2, no murmur Resp: CTABL, no wheezes, non-labored Abd: SNTND, BS present, no guarding or organomegaly Ext: No edema, warm Neuro: Alert and oriented, No gross deficits Influenza a and B test negative. Assessment and plan:  1. Flu-like symptoms   2. Influenza with respiratory manifestation   Clinically this is the flu even with the negative test.  It needs to be treated.  Meds ordered this encounter  Medications  . oseltamivir (TAMIFLU) 75 MG capsule    Sig: Take 1 capsule (75 mg total) by mouth 2 (two) times daily.    Dispense:  10 capsule    Refill:  0    Orders Placed This Encounter  Procedures  . Veritor Flu A/B Waived    Order Specific Question:   Source    Answer:   nasal    Follow up as needed.  Claretta Fraise, MD

## 2017-03-08 LAB — VERITOR FLU A/B WAIVED
Influenza A: NEGATIVE
Influenza B: NEGATIVE

## 2017-04-27 ENCOUNTER — Other Ambulatory Visit: Payer: Self-pay | Admitting: Family

## 2017-04-27 DIAGNOSIS — E785 Hyperlipidemia, unspecified: Secondary | ICD-10-CM

## 2017-04-27 DIAGNOSIS — I10 Essential (primary) hypertension: Secondary | ICD-10-CM

## 2017-05-03 ENCOUNTER — Other Ambulatory Visit: Payer: Self-pay | Admitting: Family

## 2017-05-03 DIAGNOSIS — Z794 Long term (current) use of insulin: Secondary | ICD-10-CM

## 2017-05-03 DIAGNOSIS — E1165 Type 2 diabetes mellitus with hyperglycemia: Secondary | ICD-10-CM

## 2017-07-22 DIAGNOSIS — M19012 Primary osteoarthritis, left shoulder: Secondary | ICD-10-CM | POA: Diagnosis not present

## 2017-07-22 DIAGNOSIS — M25512 Pain in left shoulder: Secondary | ICD-10-CM | POA: Diagnosis not present

## 2017-07-28 ENCOUNTER — Other Ambulatory Visit: Payer: Self-pay | Admitting: Family

## 2017-07-28 DIAGNOSIS — E785 Hyperlipidemia, unspecified: Secondary | ICD-10-CM

## 2017-07-28 DIAGNOSIS — I10 Essential (primary) hypertension: Secondary | ICD-10-CM

## 2017-08-04 DIAGNOSIS — M19012 Primary osteoarthritis, left shoulder: Secondary | ICD-10-CM | POA: Diagnosis not present

## 2017-09-01 DIAGNOSIS — M25512 Pain in left shoulder: Secondary | ICD-10-CM | POA: Diagnosis not present

## 2017-09-01 DIAGNOSIS — M19012 Primary osteoarthritis, left shoulder: Secondary | ICD-10-CM | POA: Diagnosis not present

## 2017-10-01 ENCOUNTER — Other Ambulatory Visit: Payer: Self-pay | Admitting: Family

## 2017-10-01 DIAGNOSIS — E1165 Type 2 diabetes mellitus with hyperglycemia: Secondary | ICD-10-CM

## 2017-10-01 DIAGNOSIS — Z794 Long term (current) use of insulin: Secondary | ICD-10-CM

## 2017-10-04 NOTE — Telephone Encounter (Signed)
Patient aware and states he will call back to set up apt.

## 2017-10-26 ENCOUNTER — Ambulatory Visit (INDEPENDENT_AMBULATORY_CARE_PROVIDER_SITE_OTHER): Payer: BLUE CROSS/BLUE SHIELD | Admitting: Family

## 2017-10-26 ENCOUNTER — Encounter: Payer: Self-pay | Admitting: Family

## 2017-10-26 VITALS — BP 166/94 | HR 67 | Temp 97.6°F | Ht 71.0 in | Wt 259.4 lb

## 2017-10-26 DIAGNOSIS — Z0000 Encounter for general adult medical examination without abnormal findings: Secondary | ICD-10-CM

## 2017-10-26 DIAGNOSIS — E785 Hyperlipidemia, unspecified: Secondary | ICD-10-CM | POA: Diagnosis not present

## 2017-10-26 DIAGNOSIS — Z794 Long term (current) use of insulin: Secondary | ICD-10-CM

## 2017-10-26 DIAGNOSIS — E1165 Type 2 diabetes mellitus with hyperglycemia: Secondary | ICD-10-CM | POA: Diagnosis not present

## 2017-10-26 DIAGNOSIS — Z1212 Encounter for screening for malignant neoplasm of rectum: Secondary | ICD-10-CM

## 2017-10-26 DIAGNOSIS — I10 Essential (primary) hypertension: Secondary | ICD-10-CM | POA: Diagnosis not present

## 2017-10-26 DIAGNOSIS — Z1211 Encounter for screening for malignant neoplasm of colon: Secondary | ICD-10-CM

## 2017-10-26 LAB — BAYER DCA HB A1C WAIVED: HB A1C (BAYER DCA - WAIVED): 5.7 % (ref <7.0)

## 2017-10-26 MED ORDER — METFORMIN HCL ER 750 MG PO TB24: ORAL_TABLET | ORAL | 2 refills | Status: DC

## 2017-10-26 MED ORDER — LISINOPRIL-HYDROCHLOROTHIAZIDE 20-12.5 MG PO TABS: 2.0000 | ORAL_TABLET | Freq: Every day | ORAL | 3 refills | Status: DC

## 2017-10-26 MED ORDER — ROSUVASTATIN CALCIUM 20 MG PO TABS: 20.0000 mg | ORAL_TABLET | Freq: Every day | ORAL | 2 refills | Status: DC

## 2017-10-26 MED ORDER — DULAGLUTIDE 0.75 MG/0.5ML ~~LOC~~ SOAJ: SUBCUTANEOUS | 1 refills | Status: DC

## 2017-10-26 MED ORDER — LISINOPRIL-HYDROCHLOROTHIAZIDE 20-12.5 MG PO TABS: 1.0000 | ORAL_TABLET | Freq: Every day | ORAL | 3 refills | Status: DC

## 2017-10-26 NOTE — Patient Instructions (Signed)

## 2017-10-26 NOTE — Progress Notes (Signed)
Subjective:    Patient ID: Gregory Carey, male    DOB: 03/06/1960, 57 y.o.   MRN: [id number] Chief Complaint  Patient presents with  . Medical Management of Chronic Issues    medication refill   PT presents to the office for CPE.  Diabetes  He presents for his follow-up diabetic visit. He has type 2 diabetes mellitus. His disease course has been stable. There are no hypoglycemic associated symptoms. Pertinent negatives for diabetes include no blurred vision and no foot paresthesias. Symptoms are stable. Pertinent negatives for diabetic complications include no CVA, heart disease, nephropathy or peripheral neuropathy. His overall blood glucose range is 110-130 mg/dl. Eye exam is current.  Hypertension  This is a chronic problem. The current episode started more than 1 year ago. The problem has been waxing and waning since onset. The problem is uncontrolled. Pertinent negatives include no blurred vision, peripheral edema or shortness of breath. Risk factors for coronary artery disease include diabetes mellitus, dyslipidemia, obesity, male gender and sedentary lifestyle. The current treatment provides no improvement. There is no history of kidney disease, CAD/MI or CVA.  Hyperlipidemia  This is a chronic problem. The current episode started more than 1 year ago. The problem is controlled. Recent lipid tests were reviewed and are normal. Exacerbating diseases include obesity. Pertinent negatives include no shortness of breath. Current antihyperlipidemic treatment includes statins. The current treatment provides moderate improvement of lipids. Risk factors for coronary artery disease include dyslipidemia, family history, male sex, hypertension and a sedentary lifestyle.      Review of Systems  Eyes: Negative for blurred vision.  Respiratory: Negative for shortness of breath.        Objective:   Physical Exam  Constitutional: He is oriented to person, place, and time. He appears  well-developed and well-nourished. No distress.  HENT:  Head: Normocephalic.  Right Ear: External ear normal.  Left Ear: External ear normal.  Mouth/Throat: Oropharynx is clear and moist.  Eyes: Pupils are equal, round, and reactive to light. Right eye exhibits no discharge. Left eye exhibits no discharge.  Neck: Normal range of motion. Neck supple. No thyromegaly present.  Cardiovascular: Normal rate, regular rhythm, normal heart sounds and intact distal pulses.  No murmur heard. Pulmonary/Chest: Effort normal and breath sounds normal. No respiratory distress. He has no wheezes.  Abdominal: Soft. Bowel sounds are normal. He exhibits no distension. There is no tenderness.  Musculoskeletal: Normal range of motion. He exhibits no edema (trace BLE) or tenderness.  Neurological: He is alert and oriented to person, place, and time. He has normal reflexes. No cranial nerve deficit.  Skin: Skin is warm and dry. No rash noted. No erythema.  Psychiatric: He has a normal mood and affect. His behavior is normal. Judgment and thought content normal.  Vitals reviewed.     BP (!) 166/94   Pulse 67   Temp 97.6 F (36.4 C) (Oral)   Ht 5' 11"  (1.803 m)   Wt 259 lb 6.4 oz (117.7 kg)   BMI 36.18 kg/m      Assessment & Plan:  Gregory Carey comes in today with chief complaint of Medical Management of Chronic Issues (medication refill)   Diagnosis and orders addressed:  1. Type 2 diabetes mellitus with hyperglycemia, with long-term current use of insulin (HCC) - Dulaglutide (TRULICITY) 9.32 IZ/1.2WP SOPN; Inject 1 dose (0.67m) SQ once a week. Needs to be seen for more refills.  Dispense: 12 pen; Refill: 1 - metFORMIN (GLUCOPHAGE-XR) 750 MG  24 hr tablet; TAKE 1 TABLET BY MOUTH ONCE DAILY WITH BREAKFAST  Dispense: 90 tablet; Refill: 2 - CMP14+EGFR - CBC with Differential/Platelet - Bayer DCA Hb A1c Waived  2. Essential hypertension Will increase Zestoretic to 40-25 mg from 20-12.5 mg -Dash  diet information given -Exercise encouraged - Stress Management  -Continue current meds -RTO in 2 weeks  - CMP14+EGFR - CBC with Differential/Platelet - lisinopril-hydrochlorothiazide (PRINZIDE,ZESTORETIC) 20-12.5 MG tablet; Take 2 tablets by mouth daily.  Dispense: 180 tablet; Refill: 3  3. Hyperlipidemia, unspecified hyperlipidemia type - rosuvastatin (CRESTOR) 20 MG tablet; Take 1 tablet (20 mg total) by mouth daily.  Dispense: 90 tablet; Refill: 2 - CMP14+EGFR - CBC with Differential/Platelet  4. Morbid obesity ([place]) - CMP14+EGFR - CBC with Differential/Platelet  5. Colon cancer screening PT will call [employer] and see if cologuard is covered - CMP14+EGFR - CBC with Differential/Platelet  6. Screening for malignant neoplasm of the rectum - CMP14+EGFR - CBC with Differential/Platelet  7. Annual physical exam - CMP14+EGFR - CBC with Differential/Platelet - Lipid panel - PSA, total and free - Bayer DCA Hb A1c Waived - TSH   Labs pending Health Maintenance reviewed Diet and exercise encouraged  Follow up plan: 2 weeks to recheck HTN   Evelina Dun, FNP

## 2017-10-27 LAB — CMP14+EGFR
ALK PHOS: 55 IU/L (ref 39–117)
ALT: 25 IU/L (ref 0–44)
AST: 18 IU/L (ref 0–40)
Albumin/Globulin Ratio: 2.1 (ref 1.2–2.2)
Albumin: 4.4 g/dL (ref 3.5–5.5)
BILIRUBIN TOTAL: 0.6 mg/dL (ref 0.0–1.2)
BUN / CREAT RATIO: 18 (ref 9–20)
BUN: 17 mg/dL (ref 6–24)
CHLORIDE: 104 mmol/L (ref 96–106)
CO2: 25 mmol/L (ref 20–29)
Calcium: 9.7 mg/dL (ref 8.7–10.2)
Creatinine, Ser: 0.95 mg/dL (ref 0.76–1.27)
GFR calc non Af Amer: 89 mL/min/{1.73_m2} (ref >59)
GFR, EST AFRICAN AMERICAN: 103 mL/min/{1.73_m2} (ref >59)
GLOBULIN, TOTAL: 2.1 g/dL (ref 1.5–4.5)
Glucose: 89 mg/dL (ref 65–99)
Potassium: 4.4 mmol/L (ref 3.5–5.2)
SODIUM: 146 mmol/L — AB (ref 134–144)
TOTAL PROTEIN: 6.5 g/dL (ref 6.0–8.5)

## 2017-10-27 LAB — TSH: TSH: 0.656 u[IU]/mL (ref 0.450–4.500)

## 2017-10-27 LAB — CBC WITH DIFFERENTIAL/PLATELET
Basophils Absolute: 0.1 10*3/uL (ref 0.0–0.2)
Basos: 1 %
EOS (ABSOLUTE): 0.3 10*3/uL (ref 0.0–0.4)
Eos: 4 %
HEMOGLOBIN: 15.5 g/dL (ref 13.0–17.7)
Hematocrit: 45.5 % (ref 37.5–51.0)
IMMATURE GRANS (ABS): 0 10*3/uL (ref 0.0–0.1)
Immature Granulocytes: 0 %
LYMPHS ABS: 2.6 10*3/uL (ref 0.7–3.1)
LYMPHS: 31 %
MCH: 29.8 pg (ref 26.6–33.0)
MCHC: 34.1 g/dL (ref 31.5–35.7)
MCV: 88 fL (ref 79–97)
MONOCYTES: 6 %
Monocytes Absolute: 0.5 10*3/uL (ref 0.1–0.9)
NEUTROS ABS: 4.8 10*3/uL (ref 1.4–7.0)
Neutrophils: 58 %
Platelets: 264 10*3/uL (ref 150–450)
RBC: 5.2 x10E6/uL (ref 4.14–5.80)
RDW: 12.8 % (ref 12.3–15.4)
WBC: 8.2 10*3/uL (ref 3.4–10.8)

## 2017-10-27 LAB — PSA, TOTAL AND FREE
PROSTATE SPECIFIC AG, SERUM: 0.4 ng/mL (ref 0.0–4.0)
PSA, Free Pct: 32.5 %
PSA, Free: 0.13 ng/mL

## 2017-10-27 LAB — LIPID PANEL
Chol/HDL Ratio: 2.5 ratio (ref 0.0–5.0)
Cholesterol, Total: 82 mg/dL — ABNORMAL LOW (ref 100–199)
HDL: 33 mg/dL — ABNORMAL LOW (ref >39)
LDL CALC: 26 mg/dL (ref 0–99)
Triglycerides: 116 mg/dL (ref 0–149)
VLDL CHOLESTEROL CAL: 23 mg/dL (ref 5–40)

## 2017-11-11 ENCOUNTER — Encounter: Payer: Self-pay | Admitting: Family

## 2017-11-11 ENCOUNTER — Ambulatory Visit (INDEPENDENT_AMBULATORY_CARE_PROVIDER_SITE_OTHER): Payer: BLUE CROSS/BLUE SHIELD | Admitting: Family

## 2017-11-11 VITALS — BP 138/83 | HR 73 | Temp 98.3°F | Ht 71.0 in | Wt 258.4 lb

## 2017-11-11 DIAGNOSIS — I10 Essential (primary) hypertension: Secondary | ICD-10-CM

## 2017-11-11 NOTE — Patient Instructions (Signed)

## 2017-11-11 NOTE — Progress Notes (Signed)
   Subjective:    Patient ID: Gregory Carey, male    DOB: [birth year], 57 y.o.   MRN: [id number]  Chief Complaint  Patient presents with  . Hypertension    two week recheck    Hypertension  This is a chronic problem. The current episode started more than 1 year ago. The problem has been resolved since onset. The problem is controlled. Pertinent negatives include no headaches, malaise/fatigue, peripheral edema or shortness of breath. Risk factors for coronary artery disease include dyslipidemia, obesity and male gender. The current treatment provides moderate improvement. There is no history of kidney disease, CAD/MI, CVA or heart failure.      Review of Systems  Constitutional: Negative for malaise/fatigue.  Respiratory: Negative for shortness of breath.   Neurological: Negative for headaches.  All other systems reviewed and are negative.      Objective:   Physical Exam  Constitutional: He is oriented to person, place, and time. He appears well-developed and well-nourished. No distress.  HENT:  Head: Normocephalic.  Eyes: Pupils are equal, round, and reactive to light. Right eye exhibits no discharge. Left eye exhibits no discharge.  Neck: Normal range of motion. Neck supple. No thyromegaly present.  Cardiovascular: Normal rate, regular rhythm, normal heart sounds and intact distal pulses.  No murmur heard. Pulmonary/Chest: Effort normal and breath sounds normal. No respiratory distress. He has no wheezes.  Abdominal: Soft. Bowel sounds are normal. He exhibits no distension. There is no tenderness.  Musculoskeletal: Normal range of motion. He exhibits no edema or tenderness.  Neurological: He is alert and oriented to person, place, and time. He has normal reflexes. No cranial nerve deficit.  Skin: Skin is warm and dry. No rash noted. No erythema.  Psychiatric: He has a normal mood and affect. His behavior is normal. Judgment and thought content normal.  Vitals  reviewed.    BP 138/83   Pulse 73   Temp 98.3 F (36.8 C) (Oral)   Ht _0  (1.803 m)   Wt 258 lb 6.4 oz (117.2 kg)   BMI 36.04 kg/m      Assessment & Plan:  Gregory Carey comes in today with chief complaint of Hypertension (two week recheck)   Diagnosis and orders addressed:  1. Essential hypertension -Daily blood pressure log given with instructions on how to fill out and told to bring to next visit -Dash diet information given -Exercise encouraged - Stress Management  -Continue current meds -RTO in 6 months  - BMP8+EGFR    Follow up plan: 6 months   Evelina Dun, FNP

## 2017-11-12 LAB — BMP8+EGFR
BUN/Creatinine Ratio: 20 (ref 9–20)
BUN: 18 mg/dL (ref 6–24)
CHLORIDE: 101 mmol/L (ref 96–106)
CO2: 26 mmol/L (ref 20–29)
Calcium: 9.7 mg/dL (ref 8.7–10.2)
Creatinine, Ser: 0.91 mg/dL (ref 0.76–1.27)
GFR calc non Af Amer: 94 mL/min/{1.73_m2} (ref >59)
GFR, EST AFRICAN AMERICAN: 109 mL/min/{1.73_m2} (ref >59)
Glucose: 96 mg/dL (ref 65–99)
POTASSIUM: 3.9 mmol/L (ref 3.5–5.2)
Sodium: 144 mmol/L (ref 134–144)

## 2018-04-08 ENCOUNTER — Other Ambulatory Visit: Payer: Self-pay | Admitting: Family

## 2018-04-08 DIAGNOSIS — Z794 Long term (current) use of insulin: Secondary | ICD-10-CM

## 2018-04-08 DIAGNOSIS — E1165 Type 2 diabetes mellitus with hyperglycemia: Secondary | ICD-10-CM

## 2018-04-11 NOTE — Telephone Encounter (Signed)
nvm-cb 2/24

## 2018-04-11 NOTE — Telephone Encounter (Signed)
[facility]. NTBS 30 days given 04/08/18

## 2018-04-13 ENCOUNTER — Other Ambulatory Visit: Payer: Self-pay | Admitting: Family

## 2018-04-13 DIAGNOSIS — Z794 Long term (current) use of insulin: Secondary | ICD-10-CM

## 2018-04-13 DIAGNOSIS — E1165 Type 2 diabetes mellitus with hyperglycemia: Secondary | ICD-10-CM

## 2018-04-13 NOTE — Telephone Encounter (Signed)
What is the name of the medication? Trulicity don't have any left  Have you contacted your pharmacy to request a refill? YES  Which pharmacy would you like this sent to? [facility] in [place]   Patient notified that their request is being sent to the clinical staff for review and that they should receive a call once it is complete. If they do not receive a call within 24 hours they can check with their pharmacy or our office.

## 2018-04-13 NOTE — Telephone Encounter (Signed)
appt made 04/21/18, refill sent to pharmacy

## 2018-04-15 NOTE — Telephone Encounter (Signed)
Scheduled to visit provider next week.

## 2018-04-21 ENCOUNTER — Ambulatory Visit (INDEPENDENT_AMBULATORY_CARE_PROVIDER_SITE_OTHER): Payer: BLUE CROSS/BLUE SHIELD | Admitting: Family

## 2018-04-21 ENCOUNTER — Encounter: Payer: Self-pay | Admitting: Family

## 2018-04-21 VITALS — BP 154/94 | HR 71 | Temp 98.7°F | Ht 71.0 in | Wt 269.6 lb

## 2018-04-21 DIAGNOSIS — Z1211 Encounter for screening for malignant neoplasm of colon: Secondary | ICD-10-CM

## 2018-04-21 DIAGNOSIS — E1165 Type 2 diabetes mellitus with hyperglycemia: Secondary | ICD-10-CM | POA: Diagnosis not present

## 2018-04-21 DIAGNOSIS — E785 Hyperlipidemia, unspecified: Secondary | ICD-10-CM

## 2018-04-21 DIAGNOSIS — I10 Essential (primary) hypertension: Secondary | ICD-10-CM | POA: Diagnosis not present

## 2018-04-21 DIAGNOSIS — Z794 Long term (current) use of insulin: Secondary | ICD-10-CM | POA: Diagnosis not present

## 2018-04-21 DIAGNOSIS — Z1212 Encounter for screening for malignant neoplasm of rectum: Secondary | ICD-10-CM

## 2018-04-21 LAB — BAYER DCA HB A1C WAIVED: HB A1C (BAYER DCA - WAIVED): 5.8 % (ref <7.0)

## 2018-04-21 NOTE — Progress Notes (Signed)
Subjective:    Patient ID: Gregory Carey, male    DOB: 05/06/1960, 58 y.o.   MRN: [id number]  Chief Complaint  Patient presents with  . Medical Management of Chronic Issues    refills   Pt presents to the office today for chronic follow up.  Diabetes  He presents for his follow-up diabetic visit. He has type 2 diabetes mellitus. His disease course has been stable. There are no hypoglycemic associated symptoms. Pertinent negatives for diabetes include no blurred vision, no foot paresthesias and no visual change. There are no hypoglycemic complications. Symptoms are stable. Pertinent negatives for diabetic complications include no CVA, heart disease, nephropathy or peripheral neuropathy. He is following a generally unhealthy diet. His overall blood glucose range is 130-140 mg/dl. Eye exam is not current.  Hypertension  This is a chronic problem. The current episode started more than 1 year ago. The problem has been waxing and waning since onset. The problem is uncontrolled. Associated symptoms include malaise/fatigue. Pertinent negatives include no blurred vision, peripheral edema or shortness of breath. Risk factors for coronary artery disease include dyslipidemia, diabetes mellitus, male gender, obesity and sedentary lifestyle. The current treatment provides moderate improvement. There is no history of kidney disease, CAD/MI or CVA.  Hyperlipidemia  This is a chronic problem. The current episode started more than 1 year ago. The problem is controlled. Recent lipid tests were reviewed and are normal. Exacerbating diseases include obesity. Pertinent negatives include no shortness of breath. Current antihyperlipidemic treatment includes statins. The current treatment provides moderate improvement of lipids. Risk factors for coronary artery disease include dyslipidemia, diabetes mellitus, hypertension, male sex and a sedentary lifestyle.      Review of Systems  Constitutional: Positive for  malaise/fatigue.  Eyes: Negative for blurred vision.  Respiratory: Negative for shortness of breath.   All other systems reviewed and are negative.      Objective:   Physical Exam Vitals signs reviewed.  Constitutional:      General: He is not in acute distress.    Appearance: He is well-developed.  HENT:     Head: Normocephalic.     Right Ear: Tympanic membrane normal.     Left Ear: Tympanic membrane normal.  Eyes:     General:        Right eye: No discharge.        Left eye: No discharge.     Pupils: Pupils are equal, round, and reactive to light.  Neck:     Musculoskeletal: Normal range of motion and neck supple.     Thyroid: No thyromegaly.  Cardiovascular:     Rate and Rhythm: Normal rate and regular rhythm.     Heart sounds: Normal heart sounds. No murmur.  Pulmonary:     Effort: Pulmonary effort is normal. No respiratory distress.     Breath sounds: Normal breath sounds. No wheezing.  Abdominal:     General: Bowel sounds are normal. There is no distension.     Palpations: Abdomen is soft.     Tenderness: There is no abdominal tenderness.  Musculoskeletal: Normal range of motion.        General: No tenderness.  Skin:    General: Skin is warm and dry.     Findings: No erythema or rash.  Neurological:     Mental Status: He is alert and oriented to person, place, and time.     Cranial Nerves: No cranial nerve deficit.     Deep Tendon Reflexes: Reflexes are  normal and symmetric.  Psychiatric:        Behavior: Behavior normal.        Thought Content: Thought content normal.        Judgment: Judgment normal.       BP (!) 160/91   Pulse 69   Temp 98.7 F (37.1 C) (Oral)   Ht 5' 11"  (1.803 m)   Wt 269 lb 9.6 oz (122.3 kg)   BMI 37.60 kg/m      Assessment & Plan:  Gregory Carey comes in today with chief complaint of Medical Management of Chronic Issues (refills)   Diagnosis and orders addressed:  1. Essential hypertension -Pt states she has driven  from work in [place] and has to go back to [place].  Will hold off on on medication changes, he will monitor at home If continues to >140/90, will add medications -Dash diet information given -Exercise encouraged - Stress Management  -Continue current meds - CMP14+EGFR - CBC with Differential/Platelet  2. Type 2 diabetes mellitus with hyperglycemia, with long-term current use of insulin (HCC) - CMP14+EGFR - CBC with Differential/Platelet - Microalbumin / creatinine urine ratio - Bayer DCA Hb A1c Waived  3. Morbid obesity ([place]) - CMP14+EGFR - CBC with Differential/Platelet  4. Hyperlipidemia, unspecified hyperlipidemia type - CMP14+EGFR - CBC with Differential/Platelet - Lipid panel  5. Colon cancer screening - Cologuard - CMP14+EGFR - CBC with Differential/Platelet  6. Screening for malignant neoplasm of the rectum - Cologuard - CMP14+EGFR - CBC with Differential/Platelet   Labs pending Health Maintenance reviewed Diet and exercise encouraged  Follow up plan: 3 months    Evelina Dun, FNP

## 2018-04-21 NOTE — Patient Instructions (Signed)

## 2018-04-22 LAB — CMP14+EGFR
ALT: 34 IU/L (ref 0–44)
AST: 25 IU/L (ref 0–40)
Albumin/Globulin Ratio: 2.3 — ABNORMAL HIGH (ref 1.2–2.2)
Albumin: 4.6 g/dL (ref 3.8–4.9)
Alkaline Phosphatase: 54 IU/L (ref 39–117)
BUN/Creatinine Ratio: 17 (ref 9–20)
BUN: 14 mg/dL (ref 6–24)
Bilirubin Total: 0.7 mg/dL (ref 0.0–1.2)
CALCIUM: 9.4 mg/dL (ref 8.7–10.2)
CO2: 23 mmol/L (ref 20–29)
CREATININE: 0.83 mg/dL (ref 0.76–1.27)
Chloride: 105 mmol/L (ref 96–106)
GFR, EST AFRICAN AMERICAN: 114 mL/min/{1.73_m2} (ref >59)
GFR, EST NON AFRICAN AMERICAN: 98 mL/min/{1.73_m2} (ref >59)
GLOBULIN, TOTAL: 2 g/dL (ref 1.5–4.5)
Glucose: 100 mg/dL — ABNORMAL HIGH (ref 65–99)
Potassium: 4 mmol/L (ref 3.5–5.2)
Sodium: 143 mmol/L (ref 134–144)
TOTAL PROTEIN: 6.6 g/dL (ref 6.0–8.5)

## 2018-04-22 LAB — CBC WITH DIFFERENTIAL/PLATELET
BASOS ABS: 0.1 10*3/uL (ref 0.0–0.2)
BASOS: 2 %
EOS (ABSOLUTE): 0.3 10*3/uL (ref 0.0–0.4)
EOS: 5 %
HEMOGLOBIN: 16 g/dL (ref 13.0–17.7)
Hematocrit: 47.3 % (ref 37.5–51.0)
IMMATURE GRANS (ABS): 0 10*3/uL (ref 0.0–0.1)
Immature Granulocytes: 0 %
LYMPHS: 34 %
Lymphocytes Absolute: 2.4 10*3/uL (ref 0.7–3.1)
MCH: 29.4 pg (ref 26.6–33.0)
MCHC: 33.8 g/dL (ref 31.5–35.7)
MCV: 87 fL (ref 79–97)
MONOCYTES: 8 %
Monocytes Absolute: 0.6 10*3/uL (ref 0.1–0.9)
NEUTROS ABS: 3.6 10*3/uL (ref 1.4–7.0)
Neutrophils: 51 %
Platelets: 274 10*3/uL (ref 150–450)
RBC: 5.44 x10E6/uL (ref 4.14–5.80)
RDW: 13.1 % (ref 11.6–15.4)
WBC: 7 10*3/uL (ref 3.4–10.8)

## 2018-04-22 LAB — LIPID PANEL
CHOLESTEROL TOTAL: 80 mg/dL — AB (ref 100–199)
Chol/HDL Ratio: 2.5 ratio (ref 0.0–5.0)
HDL: 32 mg/dL — ABNORMAL LOW (ref >39)
LDL CALC: 30 mg/dL (ref 0–99)
Triglycerides: 89 mg/dL (ref 0–149)
VLDL CHOLESTEROL CAL: 18 mg/dL (ref 5–40)

## 2018-04-22 LAB — MICROALBUMIN / CREATININE URINE RATIO
Creatinine, Urine: 38.5 mg/dL
Microalb/Creat Ratio: 38 mg/g creat — ABNORMAL HIGH (ref 0–29)
Microalbumin, Urine: 14.5 ug/mL

## 2018-05-07 ENCOUNTER — Other Ambulatory Visit: Payer: Self-pay | Admitting: Family

## 2018-05-07 DIAGNOSIS — E1165 Type 2 diabetes mellitus with hyperglycemia: Secondary | ICD-10-CM

## 2018-05-07 DIAGNOSIS — Z794 Long term (current) use of insulin: Secondary | ICD-10-CM

## 2018-05-16 DIAGNOSIS — M25512 Pain in left shoulder: Secondary | ICD-10-CM | POA: Diagnosis not present

## 2018-05-16 DIAGNOSIS — M19012 Primary osteoarthritis, left shoulder: Secondary | ICD-10-CM | POA: Diagnosis not present

## 2018-05-20 DIAGNOSIS — M19012 Primary osteoarthritis, left shoulder: Secondary | ICD-10-CM | POA: Diagnosis not present

## 2018-05-23 ENCOUNTER — Other Ambulatory Visit: Payer: Self-pay | Admitting: Orthopedic Surgery

## 2018-05-23 DIAGNOSIS — M19012 Primary osteoarthritis, left shoulder: Secondary | ICD-10-CM

## 2018-05-24 ENCOUNTER — Other Ambulatory Visit: Payer: Self-pay | Admitting: Orthopedic Surgery

## 2018-05-24 DIAGNOSIS — M19012 Primary osteoarthritis, left shoulder: Secondary | ICD-10-CM

## 2018-05-26 ENCOUNTER — Other Ambulatory Visit: Payer: Self-pay

## 2018-05-26 ENCOUNTER — Ambulatory Visit
Admission: RE | Admit: 2018-05-26 | Discharge: 2018-05-26 | Disposition: A | Discharge status: Home / Self Care | Payer: BLUE CROSS/BLUE SHIELD | Source: Ambulatory Visit | Attending: Orthopedic Surgery | Admitting: Orthopedic Surgery

## 2018-05-26 DIAGNOSIS — M19012 Primary osteoarthritis, left shoulder: Secondary | ICD-10-CM

## 2018-05-26 DIAGNOSIS — M25512 Pain in left shoulder: Secondary | ICD-10-CM | POA: Diagnosis not present

## 2018-06-07 ENCOUNTER — Other Ambulatory Visit: Payer: Self-pay | Admitting: Family

## 2018-06-07 DIAGNOSIS — Z794 Long term (current) use of insulin: Secondary | ICD-10-CM

## 2018-06-07 DIAGNOSIS — E1165 Type 2 diabetes mellitus with hyperglycemia: Secondary | ICD-10-CM

## 2018-06-08 DIAGNOSIS — M19012 Primary osteoarthritis, left shoulder: Secondary | ICD-10-CM | POA: Diagnosis not present

## 2018-06-27 ENCOUNTER — Telehealth: Payer: Self-pay | Admitting: Family

## 2018-07-02 ENCOUNTER — Other Ambulatory Visit: Payer: Self-pay | Admitting: Nurse Practitioner

## 2018-07-02 DIAGNOSIS — E1165 Type 2 diabetes mellitus with hyperglycemia: Secondary | ICD-10-CM

## 2018-07-02 DIAGNOSIS — Z794 Long term (current) use of insulin: Secondary | ICD-10-CM

## 2018-07-22 DIAGNOSIS — M25512 Pain in left shoulder: Secondary | ICD-10-CM | POA: Diagnosis not present

## 2018-07-22 DIAGNOSIS — M19012 Primary osteoarthritis, left shoulder: Secondary | ICD-10-CM | POA: Diagnosis not present

## 2018-07-25 ENCOUNTER — Other Ambulatory Visit: Payer: Self-pay

## 2018-07-25 ENCOUNTER — Encounter: Payer: Self-pay | Admitting: Family

## 2018-07-25 ENCOUNTER — Ambulatory Visit (INDEPENDENT_AMBULATORY_CARE_PROVIDER_SITE_OTHER): Payer: BC Managed Care – PPO | Admitting: Family

## 2018-07-25 VITALS — BP 174/101 | HR 84 | Temp 98.9°F | Ht 71.0 in | Wt 272.0 lb

## 2018-07-25 DIAGNOSIS — I10 Essential (primary) hypertension: Secondary | ICD-10-CM

## 2018-07-25 DIAGNOSIS — E785 Hyperlipidemia, unspecified: Secondary | ICD-10-CM | POA: Diagnosis not present

## 2018-07-25 DIAGNOSIS — G8929 Other chronic pain: Secondary | ICD-10-CM

## 2018-07-25 DIAGNOSIS — Z794 Long term (current) use of insulin: Secondary | ICD-10-CM

## 2018-07-25 DIAGNOSIS — E1165 Type 2 diabetes mellitus with hyperglycemia: Secondary | ICD-10-CM | POA: Diagnosis not present

## 2018-07-25 DIAGNOSIS — M25512 Pain in left shoulder: Secondary | ICD-10-CM

## 2018-07-25 MED ORDER — TRAMADOL HCL 50 MG PO TABS: 50.0000 mg | ORAL_TABLET | Freq: Two times a day (BID) | ORAL | 0 refills | Status: AC | PRN

## 2018-07-25 MED ORDER — AMLODIPINE BESYLATE 5 MG PO TABS: 5.0000 mg | ORAL_TABLET | Freq: Every day | ORAL | 3 refills | Status: DC

## 2018-07-25 NOTE — Progress Notes (Signed)
Subjective:    Patient ID: Gregory Carey, male    DOB: 1960/09/19, 58 y.o.   MRN: [id number]  Chief Complaint  Patient presents with  . Medical Management of Chronic Issues   PT presents to the office today for chronic follow up. He is followed by Ortho and is scheduled for left shoulder replacement, but since COVID it has been postponed. Diabetes  He presents for his follow-up diabetic visit. He has type 2 diabetes mellitus. His disease course has been stable. There are no hypoglycemic associated symptoms. Pertinent negatives for diabetes include no blurred vision and no foot paresthesias. Symptoms are stable. Pertinent negatives for diabetic complications include no CVA, heart disease, nephropathy or peripheral neuropathy. Risk factors for coronary artery disease include dyslipidemia, diabetes mellitus, male sex, hypertension and sedentary lifestyle. He is following a generally unhealthy diet. His overall blood glucose range is 90-110 mg/dl. Eye exam is not current.  Hypertension  This is a chronic problem. The current episode started more than 1 year ago. The problem has been waxing and waning since onset. The problem is uncontrolled. Pertinent negatives include no blurred vision, peripheral edema or shortness of breath. Risk factors for coronary artery disease include dyslipidemia, diabetes mellitus, obesity and male gender. There is no history of kidney disease, CAD/MI or CVA.  Hyperlipidemia  This is a chronic problem. The current episode started more than 1 year ago. The problem is controlled. Recent lipid tests were reviewed and are normal. Exacerbating diseases include obesity. Pertinent negatives include no shortness of breath. Current antihyperlipidemic treatment includes statins. The current treatment provides moderate improvement of lipids. Risk factors for coronary artery disease include dyslipidemia, diabetes mellitus, male sex, hypertension and a sedentary lifestyle.  Shoulder Pain    The pain is present in the left shoulder. This is a chronic problem. The current episode started more than 1 year ago. There has been no history of extremity trauma. The problem occurs constantly. The problem has been rapidly worsening. The pain is at a severity of 9/10. The pain is moderate. Associated symptoms include a limited range of motion and tingling. He has tried acetaminophen, NSAIDS and rest for the symptoms. The treatment provided mild relief.      Review of Systems  Eyes: Negative for blurred vision.  Respiratory: Negative for shortness of breath.   Musculoskeletal: Positive for arthralgias.  Neurological: Positive for tingling.  All other systems reviewed and are negative.      Objective:   Physical Exam Vitals signs reviewed.  Constitutional:      General: He is not in acute distress.    Appearance: He is well-developed.  HENT:     Head: Normocephalic.     Right Ear: Tympanic membrane normal.     Left Ear: Tympanic membrane normal.  Eyes:     General:        Right eye: No discharge.        Left eye: No discharge.     Pupils: Pupils are equal, round, and reactive to light.  Neck:     Musculoskeletal: Normal range of motion and neck supple.     Thyroid: No thyromegaly.  Cardiovascular:     Rate and Rhythm: Normal rate and regular rhythm.     Heart sounds: Normal heart sounds. No murmur.  Pulmonary:     Effort: Pulmonary effort is normal. No respiratory distress.     Breath sounds: Normal breath sounds. No wheezing.  Abdominal:     General: Bowel sounds  are normal. There is no distension.     Palpations: Abdomen is soft.     Tenderness: There is no abdominal tenderness.  Musculoskeletal:        General: No tenderness.     Comments: Decreased ROM of left shoulder, pain with abduction    Skin:    General: Skin is warm and dry.     Findings: No erythema or rash.  Neurological:     Mental Status: He is alert and oriented to person, place, and time.      Cranial Nerves: No cranial nerve deficit.     Deep Tendon Reflexes: Reflexes are normal and symmetric.  Psychiatric:        Behavior: Behavior normal.        Thought Content: Thought content normal.        Judgment: Judgment normal.       BP (!) 174/101   Pulse 84   Temp 98.9 F (37.2 C) (Oral)   Ht 5' 11"  (1.803 m)   Wt 272 lb (123.4 kg)   BMI 37.94 kg/m      Assessment & Plan:  Gregory Carey comes in today with chief complaint of Medical Management of Chronic Issues   Diagnosis and orders addressed:  1. Type 2 diabetes mellitus with hyperglycemia, with long-term current use of insulin (HCC) - CMP14+EGFR - CBC with Differential/Platelet - Bayer DCA Hb A1c Waived  2. Essential hypertension Will add Norvasc 5 mg today -Daily blood pressure log given with instructions on how to fill out and told to bring to next visit -Dash diet information given -Exercise encouraged - Stress Management  -Continue current meds -RTO in 2 weeks - amLODipine (NORVASC) 5 MG tablet; Take 1 tablet (5 mg total) by mouth daily.  Dispense: 90 tablet; Refill: 3 - CMP14+EGFR - CBC with Differential/Platelet  3. Hyperlipidemia, unspecified hyperlipidemia type - CMP14+EGFR - CBC with Differential/Platelet - Lipid panel  4. Morbid obesity ([place]) - CMP14+EGFR - CBC with Differential/Platelet  5. Chronic left shoulder pain Will given Ultram today for pain Reviewed in [place] controlled database- Has not had any controlled medications  Keep Ortho follow up  - traMADol (ULTRAM) 50 MG tablet; Take 1-2 tablets (50-100 mg total) by mouth every 12 (twelve) hours as needed for up to 5 days for moderate pain.  Dispense: 90 tablet; Refill: 0 - CMP14+EGFR - CBC with Differential/Platelet   Labs pending Health Maintenance reviewed Diet and exercise encouraged  Follow up plan: 2 weeks to recheck HTN   Evelina Dun, FNP

## 2018-07-25 NOTE — Patient Instructions (Signed)

## 2018-07-26 ENCOUNTER — Other Ambulatory Visit: Payer: Self-pay | Admitting: Family

## 2018-07-26 DIAGNOSIS — E1165 Type 2 diabetes mellitus with hyperglycemia: Secondary | ICD-10-CM

## 2018-07-26 DIAGNOSIS — E785 Hyperlipidemia, unspecified: Secondary | ICD-10-CM

## 2018-07-26 DIAGNOSIS — Z794 Long term (current) use of insulin: Secondary | ICD-10-CM

## 2018-08-01 DIAGNOSIS — Z20828 Contact with and (suspected) exposure to other viral communicable diseases: Secondary | ICD-10-CM | POA: Diagnosis not present

## 2018-08-08 ENCOUNTER — Other Ambulatory Visit: Payer: Self-pay | Admitting: Family

## 2018-08-08 DIAGNOSIS — Z794 Long term (current) use of insulin: Secondary | ICD-10-CM

## 2018-08-08 DIAGNOSIS — E1165 Type 2 diabetes mellitus with hyperglycemia: Secondary | ICD-10-CM

## 2018-08-11 ENCOUNTER — Ambulatory Visit: Payer: BC Managed Care – PPO | Admitting: Family

## 2018-08-26 ENCOUNTER — Other Ambulatory Visit: Payer: Self-pay

## 2018-08-27 ENCOUNTER — Other Ambulatory Visit: Payer: Self-pay | Admitting: Family

## 2018-08-27 DIAGNOSIS — E1165 Type 2 diabetes mellitus with hyperglycemia: Secondary | ICD-10-CM

## 2018-08-27 DIAGNOSIS — Z794 Long term (current) use of insulin: Secondary | ICD-10-CM

## 2018-08-29 ENCOUNTER — Encounter: Payer: Self-pay | Admitting: Family

## 2018-08-29 ENCOUNTER — Ambulatory Visit (INDEPENDENT_AMBULATORY_CARE_PROVIDER_SITE_OTHER): Payer: BC Managed Care – PPO | Admitting: Family

## 2018-08-29 ENCOUNTER — Other Ambulatory Visit: Payer: Self-pay

## 2018-08-29 VITALS — BP 137/83 | HR 77 | Temp 98.9°F | Ht 71.0 in | Wt 226.9 lb

## 2018-08-29 DIAGNOSIS — Z23 Encounter for immunization: Secondary | ICD-10-CM

## 2018-08-29 DIAGNOSIS — Z794 Long term (current) use of insulin: Secondary | ICD-10-CM | POA: Diagnosis not present

## 2018-08-29 DIAGNOSIS — E1165 Type 2 diabetes mellitus with hyperglycemia: Secondary | ICD-10-CM | POA: Diagnosis not present

## 2018-08-29 DIAGNOSIS — E785 Hyperlipidemia, unspecified: Secondary | ICD-10-CM | POA: Diagnosis not present

## 2018-08-29 DIAGNOSIS — I10 Essential (primary) hypertension: Secondary | ICD-10-CM

## 2018-08-29 LAB — BAYER DCA HB A1C WAIVED: HB A1C (BAYER DCA - WAIVED): 6 % (ref <7.0)

## 2018-08-29 LAB — LIPID PANEL

## 2018-08-29 NOTE — Patient Instructions (Signed)
Diabetic Retinopathy Diabetic retinopathy is a disease of the retina. The retina is a light-sensitive membrane at the back of the eye. Retinopathy is a complication of diabetes (diabetes mellitus) and a common cause of bad eyesight (visual impairment). It can eventually cause blindness. Early detection and treatment of diabetic retinopathy is important in keeping your eyes healthy and preventing further damage to them. What are the causes? Diabetic retinopathy is caused by blood sugar (glucose) levels that are too high for an extended period of time. High blood glucose over an extended period of time can:  Damage small blood vessels in the retina, allowing blood to leak through the vessel walls.  Cause new, abnormal blood vessels to grow on the retina. This can scar the retina in the advanced stage of diabetic retinopathy. What increases the risk? You are more likely to develop this condition if:  You have had diabetes for a long time.  You have poorly controlled blood glucose.  You have high blood pressure. What are the signs or symptoms? In the early stages of diabetic retinopathy, there are often no symptoms. As the condition gets worse, symptoms may include:  Blurred vision. This is usually caused by swelling due to abnormal blood glucose levels. The blurriness may go away when blood glucose levels return to normal.  Moving specks or dark spots (floaters) in your vision. These can be caused by a small amount of bleeding (hemorrhage) from retinal blood vessels.  Missing parts of your field of vision, such as vision at the sides of the eyes. This can be caused by larger retinal hemorrhages.  Difficulty reading.  Double vision.  Pain in one or both eyes.  Feeling pressure in one or both eyes.  Trouble seeing straight lines. Straight lines may not look straight.  Redness of the eyes that does not go away. How is this diagnosed? This condition may be diagnosed with an eye exam in  which your eye care specialist puts drops in your eyes that enlarge (dilate) your pupils. This lets your health care provider examine your retina and check for changes in your retinal blood vessels. How is this treated? This condition may be treated by:  Keeping your blood glucose and blood pressure within a target range.  Using a type of laser beam to seal your retinal blood vessels. This stops them from bleeding and decreases pressure in your eye.  Getting shots of medicine in the eye to reduce swelling of the center of the retina (macula). You may be given: ? Anti-VEGF medicine. This medicine can help slow vision loss, and may even improve vision. ? Steroid medicine. Follow these instructions at home:   Follow your diabetes management plan as directed by your health care provider. This may include exercising regularly and eating a healthy diet.  Keep your blood glucose level and your blood pressure in your target range, as directed by your health care provider.  Check your blood glucose as often as directed.  Take over the counter and prescription medicines only as told by your health care provider. This includes insulin and oral diabetes medicine.  Get your eyes checked at least once every year. An eye specialist can usually see diabetic retinopathy developing long before it starts to cause problems. In many cases, it can be treated to prevent complications from occurring.  Do not use any products that contain nicotine or tobacco, such as cigarettes and e-cigarettes. If you need help quitting, ask your health care provider.  Keep all follow-up   visits as told by your health care provider. This is important. Contact a health care provider if:  You notice gradual blurring or other changes in your vision over time.  You notice that your glasses or contact lenses do not make things look as sharp as they once did.  You have trouble reading or seeing details at a distance with either  eye.  You notice a change in your vision or notice that parts of your field of vision appear missing or hazy.  You suddenly see moving specks or dark spots in the field of vision of either eye. Get help right away if:  You have sudden pain or pressure in one or both eyes.  You suddenly lose vision or a curtain or veil seems to come across your eyes.  You have a sudden burst of floaters in your vision. Summary  Diabetic retinopathy is a disease of the retina. The retina is a light-sensitive membrane at the back of the eye. Retinopathy is a complication of diabetes.  Get your eyes checked at least once every year. An eye specialist can usually see diabetic retinopathy developing long before it starts to cause problems. In many cases, it can be treated to prevent complications from occurring.  Keep your blood glucose and your blood pressure in target range. Follow your diabetes management plan as directed by your health care provider.  Protect your eyes. Wear sunglasses and eye protection when needed. This information is not intended to replace advice given to you by your health care provider. Make sure you discuss any questions you have with your health care provider. Document Released: 01/31/2000 Document Revised: 03/17/2017 Document Reviewed: 03/09/2016 Elsevier Patient Education  2020 [employer].

## 2018-08-29 NOTE — Progress Notes (Signed)
Subjective:    Patient ID: Gregory Carey, male    DOB: [birth year], 58 y.o.   MRN: [id number]  Chief Complaint  Patient presents with  . Diabetes    3 mos ckup  . Hypertension  . Hyperlipidemia   Pt presents to the office today to recheck HTN. He was seen on 07/25/18 and we added Norvasc 5 mg. His BP is at goal today! He forgot to get his labs drawn, but do it today.  Hypertension This is a chronic problem. The current episode started more than 1 year ago. The problem has been resolved since onset. The problem is controlled. Pertinent negatives include no blurred vision, chest pain, malaise/fatigue, peripheral edema or shortness of breath. Risk factors for coronary artery disease include dyslipidemia, diabetes mellitus, obesity and male gender. There is no history of CVA.  Diabetes He presents for his follow-up diabetic visit. He has type 2 diabetes mellitus. His disease course has been stable. There are no hypoglycemic associated symptoms. Pertinent negatives for diabetes include no blurred vision, no chest pain and no foot paresthesias. Symptoms are stable. Pertinent negatives for diabetic complications include no CVA, heart disease, nephropathy or peripheral neuropathy. Risk factors for coronary artery disease include dyslipidemia, diabetes mellitus, male sex, hypertension and sedentary lifestyle. His overall blood glucose range is 90-110 mg/dl.      Review of Systems  Constitutional: Negative for malaise/fatigue.  Eyes: Negative for blurred vision.  Respiratory: Negative for shortness of breath.   Cardiovascular: Negative for chest pain.  All other systems reviewed and are negative.      Objective:   Physical Exam Vitals signs reviewed.  Constitutional:      General: He is not in acute distress.    Appearance: He is well-developed.  HENT:     Head: Normocephalic.     Right Ear: Tympanic membrane normal.     Left Ear: Tympanic membrane normal.  Eyes:     General:      Right eye: No discharge.        Left eye: No discharge.     Pupils: Pupils are equal, round, and reactive to light.  Neck:     Musculoskeletal: Normal range of motion and neck supple.     Thyroid: No thyromegaly.  Cardiovascular:     Rate and Rhythm: Normal rate and regular rhythm.     Heart sounds: Normal heart sounds. No murmur.  Pulmonary:     Effort: Pulmonary effort is normal. No respiratory distress.     Breath sounds: Normal breath sounds. No wheezing.  Abdominal:     General: Bowel sounds are normal. There is no distension.     Palpations: Abdomen is soft.     Tenderness: There is no abdominal tenderness.  Musculoskeletal: Normal range of motion.        General: No tenderness.  Skin:    General: Skin is warm and dry.     Findings: No erythema or rash.  Neurological:     Mental Status: He is alert and oriented to person, place, and time.     Cranial Nerves: No cranial nerve deficit.     Deep Tendon Reflexes: Reflexes are normal and symmetric.  Psychiatric:        Behavior: Behavior normal.        Thought Content: Thought content normal.        Judgment: Judgment normal.      BP 137/83   Pulse 77   Temp 98.9 F (37.2  C) (Oral)   Ht 5\' 11"  (1.803 m)   Wt 226 lb 14.4 oz (102.9 kg)   BMI 31.65 kg/m      Assessment & Plan:  Gregory Carey comes in today with chief complaint of Diabetes (3 mos ckup), Hypertension, and Hyperlipidemia   Diagnosis and orders addressed:  1. Type 2 diabetes mellitus with hyperglycemia, with long-term current use of insulin ([place])  2. Essential hypertension  3. Morbid obesity ([place])   Labs pending- Pt will labs drawn from previous visit  Health Maintenance reviewed- He will complete his cologuard and get his diabetic eye exam  Diet and exercise encouraged  Follow up plan: 4 months   Evelina Dun, FNP

## 2018-08-29 NOTE — Addendum Note (Signed)
Addended by: Antonietta Barcelona D on: 08/29/2018 09:45 AM   Modules accepted: Orders

## 2018-08-30 LAB — CMP14+EGFR
ALT: 34 IU/L (ref 0–44)
AST: 25 IU/L (ref 0–40)
Albumin/Globulin Ratio: 2.6 — ABNORMAL HIGH (ref 1.2–2.2)
Albumin: 4.6 g/dL (ref 3.8–4.9)
Alkaline Phosphatase: 55 IU/L (ref 39–117)
BUN/Creatinine Ratio: 13 (ref 9–20)
BUN: 13 mg/dL (ref 6–24)
Bilirubin Total: 0.7 mg/dL (ref 0.0–1.2)
CO2: 23 mmol/L (ref 20–29)
Calcium: 9.7 mg/dL (ref 8.7–10.2)
Chloride: 104 mmol/L (ref 96–106)
Creatinine, Ser: 1 mg/dL (ref 0.76–1.27)
GFR calc Af Amer: 97 mL/min/{1.73_m2} (ref >59)
GFR calc non Af Amer: 84 mL/min/{1.73_m2} (ref >59)
Globulin, Total: 1.8 g/dL (ref 1.5–4.5)
Glucose: 153 mg/dL — ABNORMAL HIGH (ref 65–99)
Potassium: 4.1 mmol/L (ref 3.5–5.2)
Sodium: 143 mmol/L (ref 134–144)
Total Protein: 6.4 g/dL (ref 6.0–8.5)

## 2018-08-30 LAB — LIPID PANEL
Chol/HDL Ratio: 2.3 ratio (ref 0.0–5.0)
Cholesterol, Total: 81 mg/dL — ABNORMAL LOW (ref 100–199)
HDL: 35 mg/dL — ABNORMAL LOW (ref >39)
LDL Calculated: 22 mg/dL (ref 0–99)
Triglycerides: 120 mg/dL (ref 0–149)
VLDL Cholesterol Cal: 24 mg/dL (ref 5–40)

## 2018-08-30 LAB — CBC WITH DIFFERENTIAL/PLATELET
Basophils Absolute: 0.1 10*3/uL (ref 0.0–0.2)
Basos: 1 %
EOS (ABSOLUTE): 0.2 10*3/uL (ref 0.0–0.4)
Eos: 3 %
Hematocrit: 46.3 % (ref 37.5–51.0)
Hemoglobin: 15.6 g/dL (ref 13.0–17.7)
Immature Grans (Abs): 0 10*3/uL (ref 0.0–0.1)
Immature Granulocytes: 0 %
Lymphocytes Absolute: 1.9 10*3/uL (ref 0.7–3.1)
Lymphs: 24 %
MCH: 28.4 pg (ref 26.6–33.0)
MCHC: 33.7 g/dL (ref 31.5–35.7)
MCV: 84 fL (ref 79–97)
Monocytes Absolute: 0.4 10*3/uL (ref 0.1–0.9)
Monocytes: 5 %
Neutrophils Absolute: 5.4 10*3/uL (ref 1.4–7.0)
Neutrophils: 67 %
Platelets: 288 10*3/uL (ref 150–450)
RBC: 5.5 x10E6/uL (ref 4.14–5.80)
RDW: 12.7 % (ref 11.6–15.4)
WBC: 8 10*3/uL (ref 3.4–10.8)

## 2018-09-23 NOTE — Pre-Procedure Instructions (Signed)
Gregory Carey  09/23/2018      [facility] [address] [place] [address] [place] [address] Phone: [phone] Fax: [phone]    Your procedure is scheduled on September 29, 2018.  Report to [facility] Admitting at 530 AM.  Call this number if you have problems the morning of surgery:  [phone]   Remember:  Do not eat after midnight.  You may drink clear liquids until 430 AM.  Clear liquids allowed are: G2 (Gatorade),Water, Juice (non-citric and without pulp), Clear Tea, Black Coffee only and Gatorade  Please complete your G2 Gatorade that was provided to you by 430 AM the morning of surgery.  Please, if able, drink it in one setting. DO NOT SIP.    Take these medicines the morning of surgery with A SIP OF WATER  Amlodipine (norvasc) Tramadol (Ultram)-if needed for pain  Follow your surgeon's instructions on when to hold/resume aspirin.  If no instructions were given call the office to determine how they would like to you take aspirin  Beginning now, STOP taking any Aleve, Naproxen, Ibuprofen, Motrin, Advil, Goody's, BC's, all herbal medications, fish oil, and all vitamins  WHAT DO I DO ABOUT MY DIABETES MEDICATION?  Marland Kitchen Do not take oral diabetes medicines (pills) the morning of surgery-Metformin (Glucophage).  . The day of surgery, do not take other diabetes injectables, including Byetta (exenatide), Bydureon (exenatide ER), Victoza (liraglutide), or Trulicity (dulaglutide).  Reviewed and Endorsed by [facility] Patient Education Committee, August 2015  How to Manage Your Diabetes Before and After Surgery  Why is it important to control my blood sugar before and after surgery? . Improving blood sugar levels before and after surgery helps healing and can limit problems. . A way of improving blood sugar control is eating a healthy diet by: o  Eating less sugar and carbohydrates o  Increasing activity/exercise o  Talking  with your doctor about reaching your blood sugar goals . High blood sugars (greater than 180 mg/dL) can raise your risk of infections and slow your recovery, so you will need to focus on controlling your diabetes during the weeks before surgery. . Make sure that the doctor who takes care of your diabetes knows about your planned surgery including the date and location.  How do I manage my blood sugar before surgery? . Check your blood sugar at least 4 times a day, starting 2 days before surgery, to make sure that the level is not too high or low. o Check your blood sugar the morning of your surgery when you wake up and every 2 hours until you get to the Short Stay unit. . If your blood sugar is less than 70 mg/dL, you will need to treat for low blood sugar: o Do not take insulin. o Treat a low blood sugar (less than 70 mg/dL) with  cup of clear juice (cranberry or apple), 4 glucose tablets, OR glucose gel. Recheck blood sugar in 15 minutes after treatment (to make sure it is greater than 70 mg/dL). If your blood sugar is not greater than 70 mg/dL on recheck, call [phone] o  for further instructions. . Report your blood sugar to the short stay nurse when you get to Short Stay.  . If you are admitted to the hospital after surgery: o Your blood sugar will be checked by the staff and you will probably be given insulin after surgery (instead of oral diabetes medicines)  to make sure you have good blood sugar levels. o The goal for blood sugar control after surgery is 80-180 mg/dL.  [facility]- Preparing For Surgery  Before surgery, you can play an important role. Because skin is not sterile, your skin needs to be as free of germs as possible. You can reduce the number of germs on your skin by washing with CHG (chlorahexidine gluconate) Soap before surgery.  CHG is an antiseptic cleaner which kills germs and bonds with the skin to continue killing germs even after washing.    Oral Hygiene is  also important to reduce your risk of infection.  Remember - BRUSH YOUR TEETH THE MORNING OF SURGERY WITH YOUR REGULAR TOOTHPASTE  Please do not use if you have an allergy to CHG or antibacterial soaps. If your skin becomes reddened/irritated stop using the CHG.  Do not shave (including legs and underarms) for at least 48 hours prior to first CHG shower. It is OK to shave your face.  Please follow these instructions carefully.   1. Shower the NIGHT BEFORE SURGERY and the MORNING OF SURGERY with CHG.   2. If you chose to wash your hair, wash your hair first as usual with your normal shampoo.  3. After you shampoo, rinse your hair and body thoroughly to remove the shampoo.  4. Use CHG as you would any other liquid soap. You can apply CHG directly to the skin and wash gently with a scrungie or a clean washcloth.   5. Apply the CHG Soap to your body ONLY FROM THE NECK DOWN.  Do not use on open wounds or open sores. Avoid contact with your eyes, ears, mouth and genitals (private parts). Wash Face and genitals (private parts)  with your normal soap.  6. Wash thoroughly, paying special attention to the area where your surgery will be performed.  7. Thoroughly rinse your body with warm water from the neck down.  8. DO NOT shower/wash with your normal soap after using and rinsing off the CHG Soap.  9. Pat yourself dry with a CLEAN TOWEL.  10. Wear CLEAN PAJAMAS to bed the night before surgery, wear comfortable clothes the morning of surgery  11. Place CLEAN SHEETS on your bed the night of your first shower and DO NOT SLEEP WITH PETS.  Day of Surgery: Shower as above Do not apply any deodorants/lotions.  Please wear clean clothes to the hospital/surgery center.   Remember to brush your teeth WITH YOUR REGULAR TOOTHPASTE.   Do not wear jewelry  Do not wear lotions, powders, or colognes, or deodorant.  Men may shave face and neck.  Do not bring valuables to the hospital.   [facility] is  not responsible for any belongings or valuables.  IF you are a smoker, DO NOT Smoke 24 hours prior to surgery   IF you wear a CPAP at night please bring your mask, tubing, and machine the morning of surgery    Remember that you must have someone to transport you home after your surgery, and remain with you for 24 hours if you are discharged the same day.  Contacts, dentures or bridgework may not be worn into surgery.  Leave your suitcase in the car.  After surgery it may be brought to your room.  For patients admitted to the hospital, discharge time will be determined by your treatment team.  Patients discharged the day of surgery will not be allowed to drive home.   Please read over the following  fact sheets that you were given.

## 2018-09-24 ENCOUNTER — Other Ambulatory Visit: Payer: Self-pay | Admitting: Family

## 2018-09-24 DIAGNOSIS — E1165 Type 2 diabetes mellitus with hyperglycemia: Secondary | ICD-10-CM

## 2018-09-24 DIAGNOSIS — Z794 Long term (current) use of insulin: Secondary | ICD-10-CM

## 2018-09-26 ENCOUNTER — Other Ambulatory Visit (HOSPITAL_COMMUNITY)
Admission: RE | Admit: 2018-09-26 | Discharge: 2018-09-26 | Disposition: A | Discharge status: Home / Self Care | Payer: BC Managed Care – PPO | Source: Ambulatory Visit | Attending: Orthopedic Surgery | Admitting: Orthopedic Surgery

## 2018-09-26 ENCOUNTER — Encounter (HOSPITAL_COMMUNITY)
Admission: RE | Admit: 2018-09-26 | Discharge: 2018-09-26 | Disposition: A | Discharge status: Home / Self Care | Payer: BC Managed Care – PPO | Source: Ambulatory Visit | Attending: Orthopedic Surgery | Admitting: Orthopedic Surgery

## 2018-09-26 ENCOUNTER — Other Ambulatory Visit: Payer: Self-pay

## 2018-09-26 ENCOUNTER — Encounter (HOSPITAL_COMMUNITY): Payer: Self-pay

## 2018-09-26 DIAGNOSIS — Z20828 Contact with and (suspected) exposure to other viral communicable diseases: Secondary | ICD-10-CM | POA: Insufficient documentation

## 2018-09-26 DIAGNOSIS — Z01818 Encounter for other preprocedural examination: Secondary | ICD-10-CM | POA: Diagnosis not present

## 2018-09-26 DIAGNOSIS — R9431 Abnormal electrocardiogram [ECG] [EKG]: Secondary | ICD-10-CM | POA: Diagnosis not present

## 2018-09-26 HISTORY — DX: Failed or difficult intubation, initial encounter: T88.4XXA

## 2018-09-26 LAB — CBC
HCT: 43.5 % (ref 39.0–52.0)
Hemoglobin: 15.7 g/dL (ref 13.0–17.0)
MCH: 31.7 pg (ref 26.0–34.0)
MCHC: 36.1 g/dL — ABNORMAL HIGH (ref 30.0–36.0)
MCV: 87.7 fL (ref 80.0–100.0)
Platelets: 580 10*3/uL — ABNORMAL HIGH (ref 150–400)
RBC: 4.96 MIL/uL (ref 4.22–5.81)
RDW: 12.7 % (ref 11.5–15.5)
WBC: 7.3 10*3/uL (ref 4.0–10.5)
nRBC: 1.8 % — ABNORMAL HIGH (ref 0.0–0.2)

## 2018-09-26 LAB — BASIC METABOLIC PANEL
Anion gap: 12 (ref 5–15)
BUN: 17 mg/dL (ref 6–20)
CO2: 24 mmol/L (ref 22–32)
Calcium: 9.1 mg/dL (ref 8.9–10.3)
Chloride: 106 mmol/L (ref 98–111)
Creatinine, Ser: 0.96 mg/dL (ref 0.61–1.24)
GFR calc Af Amer: >60 mL/min (ref >60)
GFR calc non Af Amer: >60 mL/min (ref >60)
Glucose, Bld: 148 mg/dL — ABNORMAL HIGH (ref 70–99)
Potassium: 4.8 mmol/L (ref 3.5–5.1)
Sodium: 142 mmol/L (ref 135–145)

## 2018-09-26 LAB — SARS CORONAVIRUS 2 (TAT 6-24 HRS): SARS Coronavirus 2: NEGATIVE

## 2018-09-26 LAB — GLUCOSE, CAPILLARY: Glucose-Capillary: 189 mg/dL — ABNORMAL HIGH (ref 70–99)

## 2018-09-26 LAB — SURGICAL PCR SCREEN
MRSA, PCR: NEGATIVE
Staphylococcus aureus: NEGATIVE

## 2018-09-26 NOTE — Pre-Procedure Instructions (Signed)
  Coronavirus Screening Scheduled for COVID test today . Have you experienced the following symptoms:  Cough yes/no: No Fever (>100.71F)  yes/no: No Runny nose yes/no: No Sore throat yes/no: No Difficulty breathing/shortness of breath  yes/no: No Have you or a family member traveled in the last 14 days and where? yes/no: No   PCP - Christy Hawks,FNP  Cardiologist - denies  Chest x-ray - NA  EKG - Today  Stress Test - denies  ECHO - denies  Cardiac Cath - denies  AICD-denies PM-denies LOOP-denies  Sleep Study - denies. Apnea score=5.Results sent to PCP. CPAP - NA  LABS-PCR,CBC,BMP  ASA-81mg . Pt stated he will check with Dr. Stann Mainland regarding Aspirin regimen for surgery.  ERAS-G2 given with instructions  HA1C-6.0 (08-30-18) Fasting Blood Sugar - 189  Lo-100's    HI-140 Checks Blood Sugar _2 times a day  Anesthesia-Y. Review EKG. Pt stated he was a difficult intubation for a prior surgery due to narrow airway.  Pt denies having chest pain, sob, or fever at this time. All instructions explained to the pt, with a verbal understanding of the material. Pt agrees to go over the instructions while at home for a better understanding. Pt also instructed to self quarantine after being tested for COVID-19. The opportunity to ask questions was provided.

## 2018-09-26 NOTE — Progress Notes (Signed)
   09/26/18 0840  OBSTRUCTIVE SLEEP APNEA  Have you ever been diagnosed with sleep apnea through a sleep study? No  Do you snore loudly (loud enough to be heard through closed doors)?  0  Do you often feel tired, fatigued, or sleepy during the daytime (such as falling asleep during driving or talking to someone)? 0  Has anyone observed you stop breathing during your sleep? 0  Do you have, or are you being treated for high blood pressure? 1  BMI more than 35 kg/m2? 1  Age > 50 (1-yes) 1  Neck circumference greater than:Male 16 inches or larger, Male 17inches or larger? 1 (27)  Male Gender (Yes=1) 1  Obstructive Sleep Apnea Score 5  Score 5 or greater  Results sent to PCP

## 2018-09-27 NOTE — Anesthesia Preprocedure Evaluation (Addendum)
Anesthesia Evaluation  Patient identified by MRN, date of birth, ID band Patient awake    Reviewed: Allergy & Precautions, NPO status   History of Anesthesia Complications (+) DIFFICULT AIRWAY  Airway Mallampati: II     Mouth opening: Limited Mouth Opening  Dental   Pulmonary    breath sounds clear to auscultation       Cardiovascular hypertension,  Rhythm:Regular Rate:Normal     Neuro/Psych    GI/Hepatic negative GI ROS, Neg liver ROS,   Endo/Other  diabetes  Renal/GU      Musculoskeletal   Abdominal   Peds  Hematology   Anesthesia Other Findings   Reproductive/Obstetrics                          Anesthesia Physical Anesthesia Plan  ASA: III  Anesthesia Plan: General   Post-op Pain Management:  Regional for Post-op pain   Induction: Intravenous  PONV Risk Score and Plan: 2 and Ondansetron, Dexamethasone and Midazolam  Airway Management Planned: Oral ETT and Video Laryngoscope Planned  Additional Equipment:   Intra-op Plan:   Post-operative Plan: Possible Post-op intubation/ventilation  Informed Consent: I have reviewed the patients History and Physical, chart, labs and discussed the procedure including the risks, benefits and alternatives for the proposed anesthesia with the patient or authorized representative who has indicated his/her understanding and acceptance.     Dental advisory given  Plan Discussed with: CRNA and Anesthesiologist  Anesthesia Plan Comments: (Difficult Airway Pt reports he had arthroscopic shoulder surgery 10+ years ago and was told he had a "small airway". He denies any perioperative complications.    Anesthesia records from shoulder arthroscopy done in 2003 reviewed, copy on pt chart. Per written remarks "DL by CRNA - decreased opening, poor visibility, no cords with cricoid pressure. 2nd DL and intubated by Dr. Winfred Leeds.")   Anesthesia Quick  Evaluation

## 2018-09-29 ENCOUNTER — Encounter (HOSPITAL_COMMUNITY)
Admission: RE | Disposition: A | Discharge status: Home / Self Care | Payer: Self-pay | Source: Ambulatory Visit | Attending: Orthopedic Surgery

## 2018-09-29 ENCOUNTER — Ambulatory Visit (HOSPITAL_COMMUNITY): Payer: BC Managed Care – PPO | Admitting: Anesthesiology

## 2018-09-29 ENCOUNTER — Other Ambulatory Visit: Payer: Self-pay

## 2018-09-29 ENCOUNTER — Inpatient Hospital Stay (HOSPITAL_COMMUNITY): Payer: BC Managed Care – PPO

## 2018-09-29 ENCOUNTER — Observation Stay (HOSPITAL_COMMUNITY)
Admission: RE | Admit: 2018-09-29 | Discharge: 2018-09-30 | Disposition: A | Discharge status: Home / Self Care | Payer: BC Managed Care – PPO | Source: Ambulatory Visit | Attending: Orthopedic Surgery | Admitting: Orthopedic Surgery

## 2018-09-29 ENCOUNTER — Encounter (HOSPITAL_COMMUNITY): Payer: Self-pay

## 2018-09-29 ENCOUNTER — Ambulatory Visit (HOSPITAL_COMMUNITY): Payer: BC Managed Care – PPO | Admitting: Physician Assistant

## 2018-09-29 DIAGNOSIS — Z7984 Long term (current) use of oral hypoglycemic drugs: Secondary | ICD-10-CM | POA: Diagnosis not present

## 2018-09-29 DIAGNOSIS — Z96612 Presence of left artificial shoulder joint: Secondary | ICD-10-CM

## 2018-09-29 DIAGNOSIS — Z7982 Long term (current) use of aspirin: Secondary | ICD-10-CM | POA: Diagnosis not present

## 2018-09-29 DIAGNOSIS — E119 Type 2 diabetes mellitus without complications: Secondary | ICD-10-CM | POA: Insufficient documentation

## 2018-09-29 DIAGNOSIS — E785 Hyperlipidemia, unspecified: Secondary | ICD-10-CM | POA: Insufficient documentation

## 2018-09-29 DIAGNOSIS — M19012 Primary osteoarthritis, left shoulder: Secondary | ICD-10-CM | POA: Diagnosis not present

## 2018-09-29 DIAGNOSIS — I10 Essential (primary) hypertension: Secondary | ICD-10-CM | POA: Diagnosis not present

## 2018-09-29 DIAGNOSIS — Z79899 Other long term (current) drug therapy: Secondary | ICD-10-CM | POA: Insufficient documentation

## 2018-09-29 DIAGNOSIS — G8918 Other acute postprocedural pain: Secondary | ICD-10-CM | POA: Diagnosis not present

## 2018-09-29 DIAGNOSIS — Z471 Aftercare following joint replacement surgery: Secondary | ICD-10-CM | POA: Diagnosis not present

## 2018-09-29 HISTORY — DX: Primary osteoarthritis, left shoulder: M19.012

## 2018-09-29 HISTORY — PX: TOTAL SHOULDER ARTHROPLASTY: SHX126

## 2018-09-29 LAB — GLUCOSE, CAPILLARY
Glucose-Capillary: 100 mg/dL — ABNORMAL HIGH (ref 70–99)
Glucose-Capillary: 141 mg/dL — ABNORMAL HIGH (ref 70–99)
Glucose-Capillary: 177 mg/dL — ABNORMAL HIGH (ref 70–99)
Glucose-Capillary: 154 mg/dL — ABNORMAL HIGH (ref 70–99)

## 2018-09-29 SURGERY — ARTHROPLASTY, SHOULDER, TOTAL
Anesthesia: General | Site: Shoulder | Laterality: Left

## 2018-09-29 MED ORDER — TRANEXAMIC ACID-NACL 1000-0.7 MG/100ML-% IV SOLN
1000.0000 mg | INTRAVENOUS | Status: AC
  Administered 2018-09-29: 08:00:00 1000 mg via INTRAVENOUS
  Filled 2018-09-29: qty 100

## 2018-09-29 MED ORDER — MENTHOL 3 MG MT LOZG: 1.0000 | LOZENGE | OROMUCOSAL | Status: DC | PRN

## 2018-09-29 MED ORDER — ACETAMINOPHEN 500 MG PO TABS
500.0000 mg | ORAL_TABLET | Freq: Four times a day (QID) | ORAL | Status: AC
  Administered 2018-09-29 – 2018-09-30 (×4): 500 mg via ORAL
  Filled 2018-09-29 (×4): qty 1

## 2018-09-29 MED ORDER — ONDANSETRON HCL 4 MG PO TABS: 4.0000 mg | ORAL_TABLET | Freq: Four times a day (QID) | ORAL | Status: DC | PRN

## 2018-09-29 MED ORDER — METOCLOPRAMIDE HCL 5 MG PO TABS: 5.0000 mg | ORAL_TABLET | Freq: Three times a day (TID) | ORAL | Status: DC | PRN

## 2018-09-29 MED ORDER — AMLODIPINE BESYLATE 5 MG PO TABS: 5.0000 mg | ORAL_TABLET | Freq: Every day | ORAL | Status: DC

## 2018-09-29 MED ORDER — FENTANYL CITRATE (PF) 250 MCG/5ML IJ SOLN
INTRAMUSCULAR | Status: AC
  Filled 2018-09-29: qty 5

## 2018-09-29 MED ORDER — PROPOFOL 10 MG/ML IV BOLUS
INTRAVENOUS | Status: AC
  Filled 2018-09-29: qty 20

## 2018-09-29 MED ORDER — LACTATED RINGERS IV SOLN
INTRAVENOUS | Status: DC | PRN
  Administered 2018-09-29 (×2): via INTRAVENOUS

## 2018-09-29 MED ORDER — MORPHINE SULFATE (PF) 2 MG/ML IV SOLN: 0.5000 mg | INTRAVENOUS | Status: DC | PRN

## 2018-09-29 MED ORDER — METHOCARBAMOL 1000 MG/10ML IJ SOLN
500.0000 mg | Freq: Four times a day (QID) | INTRAVENOUS | Status: DC | PRN
  Filled 2018-09-29: qty 5

## 2018-09-29 MED ORDER — SODIUM CHLORIDE 0.9 % IV SOLN
INTRAVENOUS | Status: DC | PRN
  Administered 2018-09-29: 08:00:00 50 ug/min via INTRAVENOUS

## 2018-09-29 MED ORDER — FENTANYL CITRATE (PF) 100 MCG/2ML IJ SOLN
INTRAMUSCULAR | Status: DC | PRN
  Administered 2018-09-29 (×2): 25 ug via INTRAVENOUS
  Administered 2018-09-29: 50 ug via INTRAVENOUS

## 2018-09-29 MED ORDER — EPHEDRINE SULFATE-NACL 50-0.9 MG/10ML-% IV SOSY
PREFILLED_SYRINGE | INTRAVENOUS | Status: DC | PRN
  Administered 2018-09-29 (×2): 10 mg via INTRAVENOUS

## 2018-09-29 MED ORDER — LISINOPRIL-HYDROCHLOROTHIAZIDE 20-12.5 MG PO TABS: 2.0000 | ORAL_TABLET | Freq: Every day | ORAL | Status: DC

## 2018-09-29 MED ORDER — METFORMIN HCL ER 750 MG PO TB24
750.0000 mg | ORAL_TABLET | Freq: Every day | ORAL | Status: DC
  Administered 2018-09-30: 750 mg via ORAL
  Filled 2018-09-29 (×2): qty 1

## 2018-09-29 MED ORDER — ROCURONIUM BROMIDE 10 MG/ML (PF) SYRINGE
PREFILLED_SYRINGE | INTRAVENOUS | Status: AC
  Filled 2018-09-29: qty 10

## 2018-09-29 MED ORDER — SUGAMMADEX SODIUM 200 MG/2ML IV SOLN
INTRAVENOUS | Status: DC | PRN
  Administered 2018-09-29: 243 mg via INTRAVENOUS

## 2018-09-29 MED ORDER — LISINOPRIL 20 MG PO TABS: 40.0000 mg | ORAL_TABLET | Freq: Every day | ORAL | Status: DC

## 2018-09-29 MED ORDER — DEXAMETHASONE SODIUM PHOSPHATE 10 MG/ML IJ SOLN
INTRAMUSCULAR | Status: AC
  Filled 2018-09-29: qty 1

## 2018-09-29 MED ORDER — ROSUVASTATIN CALCIUM 20 MG PO TABS
20.0000 mg | ORAL_TABLET | Freq: Every day | ORAL | Status: DC
  Administered 2018-09-29: 20 mg via ORAL
  Filled 2018-09-29: qty 1

## 2018-09-29 MED ORDER — EPHEDRINE 5 MG/ML INJ
INTRAVENOUS | Status: AC
  Filled 2018-09-29: qty 10

## 2018-09-29 MED ORDER — 0.9 % SODIUM CHLORIDE (POUR BTL) OPTIME
TOPICAL | Status: DC | PRN
  Administered 2018-09-29 (×2): 1000 mL

## 2018-09-29 MED ORDER — PHENOL 1.4 % MT LIQD: 1.0000 | OROMUCOSAL | Status: DC | PRN

## 2018-09-29 MED ORDER — MIDAZOLAM HCL 5 MG/5ML IJ SOLN
INTRAMUSCULAR | Status: DC | PRN
  Administered 2018-09-29: 2 mg via INTRAVENOUS

## 2018-09-29 MED ORDER — TRANEXAMIC ACID-NACL 1000-0.7 MG/100ML-% IV SOLN
1000.0000 mg | Freq: Once | INTRAVENOUS | Status: AC
  Administered 2018-09-29: 1000 mg via INTRAVENOUS
  Filled 2018-09-29 (×3): qty 100

## 2018-09-29 MED ORDER — SUCCINYLCHOLINE CHLORIDE 200 MG/10ML IV SOSY
PREFILLED_SYRINGE | INTRAVENOUS | Status: AC
  Filled 2018-09-29: qty 10

## 2018-09-29 MED ORDER — SUCCINYLCHOLINE CHLORIDE 200 MG/10ML IV SOSY
PREFILLED_SYRINGE | INTRAVENOUS | Status: DC | PRN
  Administered 2018-09-29: 180 mg via INTRAVENOUS

## 2018-09-29 MED ORDER — HYDROMORPHONE HCL 1 MG/ML IJ SOLN: 0.2500 mg | INTRAMUSCULAR | Status: DC | PRN

## 2018-09-29 MED ORDER — CEFAZOLIN SODIUM-DEXTROSE 2-4 GM/100ML-% IV SOLN
2.0000 g | Freq: Four times a day (QID) | INTRAVENOUS | Status: AC
  Administered 2018-09-29 – 2018-09-30 (×3): 2 g via INTRAVENOUS
  Filled 2018-09-29 (×3): qty 100

## 2018-09-29 MED ORDER — PROPOFOL 10 MG/ML IV BOLUS
INTRAVENOUS | Status: DC | PRN
  Administered 2018-09-29: 50 mg via INTRAVENOUS
  Administered 2018-09-29: 200 mg via INTRAVENOUS

## 2018-09-29 MED ORDER — CHLORHEXIDINE GLUCONATE 4 % EX LIQD: 60.0000 mL | Freq: Once | CUTANEOUS | Status: DC

## 2018-09-29 MED ORDER — ACETAMINOPHEN 325 MG PO TABS: 325.0000 mg | ORAL_TABLET | Freq: Four times a day (QID) | ORAL | Status: DC | PRN

## 2018-09-29 MED ORDER — ROCURONIUM BROMIDE 10 MG/ML (PF) SYRINGE
PREFILLED_SYRINGE | INTRAVENOUS | Status: DC | PRN
  Administered 2018-09-29: 50 mg via INTRAVENOUS

## 2018-09-29 MED ORDER — HYDROCODONE-ACETAMINOPHEN 7.5-325 MG PO TABS: 1.0000 | ORAL_TABLET | ORAL | Status: DC | PRN

## 2018-09-29 MED ORDER — DOCUSATE SODIUM 100 MG PO CAPS
100.0000 mg | ORAL_CAPSULE | Freq: Two times a day (BID) | ORAL | Status: DC
  Administered 2018-09-29: 100 mg via ORAL
  Filled 2018-09-29: qty 1

## 2018-09-29 MED ORDER — DEXAMETHASONE SODIUM PHOSPHATE 10 MG/ML IJ SOLN
INTRAMUSCULAR | Status: DC | PRN
  Administered 2018-09-29: 10 mg via INTRAVENOUS

## 2018-09-29 MED ORDER — MIDAZOLAM HCL 2 MG/2ML IJ SOLN
INTRAMUSCULAR | Status: AC
  Filled 2018-09-29: qty 2

## 2018-09-29 MED ORDER — NAPROXEN 250 MG PO TABS
250.0000 mg | ORAL_TABLET | Freq: Two times a day (BID) | ORAL | Status: DC
  Administered 2018-09-29 – 2018-09-30 (×2): 250 mg via ORAL
  Filled 2018-09-29 (×2): qty 1

## 2018-09-29 MED ORDER — METHOCARBAMOL 500 MG PO TABS
500.0000 mg | ORAL_TABLET | Freq: Four times a day (QID) | ORAL | Status: DC | PRN
  Administered 2018-09-29 – 2018-09-30 (×2): 500 mg via ORAL
  Filled 2018-09-29 (×2): qty 1

## 2018-09-29 MED ORDER — CEFAZOLIN SODIUM-DEXTROSE 2-4 GM/100ML-% IV SOLN
2.0000 g | INTRAVENOUS | Status: AC
  Administered 2018-09-29: 1 g via INTRAVENOUS
  Administered 2018-09-29: 08:00:00 2 g via INTRAVENOUS
  Filled 2018-09-29: qty 100

## 2018-09-29 MED ORDER — CEFAZOLIN SODIUM 1 G IJ SOLR
INTRAMUSCULAR | Status: AC
  Filled 2018-09-29: qty 10

## 2018-09-29 MED ORDER — ONDANSETRON HCL 4 MG/2ML IJ SOLN: 4.0000 mg | Freq: Four times a day (QID) | INTRAMUSCULAR | Status: DC | PRN

## 2018-09-29 MED ORDER — METOCLOPRAMIDE HCL 5 MG/ML IJ SOLN: 5.0000 mg | Freq: Three times a day (TID) | INTRAMUSCULAR | Status: DC | PRN

## 2018-09-29 MED ORDER — ONDANSETRON HCL 4 MG/2ML IJ SOLN
INTRAMUSCULAR | Status: DC | PRN
  Administered 2018-09-29: 4 mg via INTRAVENOUS

## 2018-09-29 MED ORDER — BUPIVACAINE HCL (PF) 0.25 % IJ SOLN
INTRAMUSCULAR | Status: AC
  Filled 2018-09-29: qty 30

## 2018-09-29 MED ORDER — VANCOMYCIN HCL 1000 MG IV SOLR
INTRAVENOUS | Status: AC
  Filled 2018-09-29: qty 1000

## 2018-09-29 MED ORDER — ONDANSETRON HCL 4 MG/2ML IJ SOLN
INTRAMUSCULAR | Status: AC
  Filled 2018-09-29: qty 2

## 2018-09-29 MED ORDER — LIDOCAINE 2% (20 MG/ML) 5 ML SYRINGE
INTRAMUSCULAR | Status: DC | PRN
  Administered 2018-09-29: 80 mg via INTRAVENOUS

## 2018-09-29 MED ORDER — LIDOCAINE 2% (20 MG/ML) 5 ML SYRINGE
INTRAMUSCULAR | Status: AC
  Filled 2018-09-29: qty 5

## 2018-09-29 MED ORDER — HYDROCODONE-ACETAMINOPHEN 5-325 MG PO TABS
1.0000 | ORAL_TABLET | ORAL | Status: DC | PRN
  Administered 2018-09-30: 2 via ORAL
  Filled 2018-09-29: qty 2

## 2018-09-29 MED ORDER — HYDROCHLOROTHIAZIDE 25 MG PO TABS: 25.0000 mg | ORAL_TABLET | Freq: Every day | ORAL | Status: DC

## 2018-09-29 MED ORDER — ASPIRIN EC 81 MG PO TBEC
81.0000 mg | DELAYED_RELEASE_TABLET | Freq: Every day | ORAL | Status: DC
  Administered 2018-09-30: 81 mg via ORAL
  Filled 2018-09-29: qty 1

## 2018-09-29 MED ORDER — VANCOMYCIN HCL 1000 MG IV SOLR
INTRAVENOUS | Status: DC | PRN
  Administered 2018-09-29: 1000 mg

## 2018-09-29 MED ORDER — TRAMADOL HCL 50 MG PO TABS
50.0000 mg | ORAL_TABLET | Freq: Four times a day (QID) | ORAL | Status: DC
  Administered 2018-09-29 – 2018-09-30 (×5): 50 mg via ORAL
  Filled 2018-09-29 (×5): qty 1

## 2018-09-29 SURGICAL SUPPLY — 66 items
ALCOHOL 70% 16 OZ (MISCELLANEOUS) ×2 IMPLANT
ANCHOR SUT 2.6 FBRTK DBL SUTP (Anchor) ×4 IMPLANT
ANCHOR SUT BIO SW 4.75X19.1 (Anchor) ×4 IMPLANT
BIT DRILL 5/64X5 DISP (BIT) IMPLANT
BLADE SAG 18X100X1.27 (BLADE) ×2 IMPLANT
BODY HUM ECLIPSE TRUNION OD 51 (Shoulder) ×2 IMPLANT
CALIBRATOR GLENOID VIP 5-D (SYSTAGENIX WOUND MANAGEMENT) ×2 IMPLANT
CEMENT BONE R 1X40 (Cement) ×2 IMPLANT
CLSR STERI-STRIP ANTIMIC 1/2X4 (GAUZE/BANDAGES/DRESSINGS) ×2 IMPLANT
COVER SURGICAL LIGHT HANDLE (MISCELLANEOUS) ×2 IMPLANT
COVER WAND RF STERILE (DRAPES) ×2 IMPLANT
DRAPE INCISE IOBAN 66X45 STRL (DRAPES) ×2 IMPLANT
DRAPE ORTHO SPLIT 77X108 STRL (DRAPES) ×2
DRAPE SURG ORHT 6 SPLT 77X108 (DRAPES) ×2 IMPLANT
DRAPE U-SHAPE 47X51 STRL (DRAPES) ×2 IMPLANT
DRSG ADAPTIC 3X8 NADH LF (GAUZE/BANDAGES/DRESSINGS) ×2 IMPLANT
DRSG AQUACEL AG ADV 3.5X10 (GAUZE/BANDAGES/DRESSINGS) ×2 IMPLANT
DRSG PAD ABDOMINAL 8X10 ST (GAUZE/BANDAGES/DRESSINGS) ×2 IMPLANT
DURAPREP 26ML APPLICATOR (WOUND CARE) ×2 IMPLANT
ELECT BLADE 4.0 EZ CLEAN MEGAD (MISCELLANEOUS) ×2
ELECT REM PT RETURN 9FT ADLT (ELECTROSURGICAL) ×2
ELECTRODE BLDE 4.0 EZ CLN MEGD (MISCELLANEOUS) ×1 IMPLANT
ELECTRODE REM PT RTRN 9FT ADLT (ELECTROSURGICAL) ×1 IMPLANT
GAUZE SPONGE 4X4 12PLY STRL (GAUZE/BANDAGES/DRESSINGS) ×2 IMPLANT
GLENOID UNI VAULTLOCK LRG (Shoulder) ×2 IMPLANT
GLOVE BIO SURGEON STRL SZ7.5 (GLOVE) ×2 IMPLANT
GLOVE BIOGEL PI IND STRL 8 (GLOVE) ×1 IMPLANT
GLOVE BIOGEL PI INDICATOR 8 (GLOVE) ×1
GOWN STRL REUS W/ TWL LRG LVL3 (GOWN DISPOSABLE) ×1 IMPLANT
GOWN STRL REUS W/ TWL XL LVL3 (GOWN DISPOSABLE) ×2 IMPLANT
GOWN STRL REUS W/TWL LRG LVL3 (GOWN DISPOSABLE) ×1
GOWN STRL REUS W/TWL XL LVL3 (GOWN DISPOSABLE) ×2
HEAD HUMERAL ECLIPSE 49/20 (Shoulder) ×2 IMPLANT
KIT ANCHOR FBRTK 2.6 STR (KITS) ×2 IMPLANT
KIT BASIN OR (CUSTOM PROCEDURE TRAY) ×2 IMPLANT
KIT TURNOVER KIT B (KITS) ×2 IMPLANT
MANIFOLD NEPTUNE II (INSTRUMENTS) ×2 IMPLANT
NDL SUT 6 .5 CRC .975X.05 MAYO (NEEDLE) ×1 IMPLANT
NEEDLE MAYO TAPER (NEEDLE) ×1
NS IRRIG 1000ML POUR BTL (IV SOLUTION) ×2 IMPLANT
PACK SHOULDER (CUSTOM PROCEDURE TRAY) ×2 IMPLANT
PAD ARMBOARD 7.5X6 YLW CONV (MISCELLANEOUS) ×4 IMPLANT
PIN NITINOL TARGETER 2.8 (PIN) ×2 IMPLANT
SCREW SPINAL 40 F/CAGE LRG (Screw) ×2 IMPLANT
SIZER ECLIPSE CAGE SCREW (ORTHOPEDIC DISPOSABLE SUPPLIES) ×2 IMPLANT
SLING ARM FOAM STRAP LRG (SOFTGOODS) IMPLANT
SLING ARM FOAM STRAP MED (SOFTGOODS) IMPLANT
SMARTMIX MINI TOWER (MISCELLANEOUS) ×2
SPONGE LAP 18X18 RF (DISPOSABLE) ×2 IMPLANT
SPONGE LAP 18X18 X RAY DECT (DISPOSABLE) ×2 IMPLANT
SPONGE LAP 4X18 RFD (DISPOSABLE) IMPLANT
STRIP CLOSURE SKIN 1/2X4 (GAUZE/BANDAGES/DRESSINGS) ×2 IMPLANT
SUCTION FRAZIER HANDLE 10FR (MISCELLANEOUS) ×1
SUCTION TUBE FRAZIER 10FR DISP (MISCELLANEOUS) ×1 IMPLANT
SUT FIBERWIRE #2 38 REV NDL BL (SUTURE) ×4
SUT FIBERWIRE #2 38 T-5 BLUE (SUTURE) ×4
SUT MNCRL AB 3-0 PS2 18 (SUTURE) ×2 IMPLANT
SUT MNCRL AB 4-0 PS2 18 (SUTURE) ×2 IMPLANT
SUT VIC AB 2-0 CT1 27 (SUTURE) ×2
SUT VIC AB 2-0 CT1 TAPERPNT 27 (SUTURE) ×2 IMPLANT
SUTURE FIBERWR #2 38 T-5 BLUE (SUTURE) ×2 IMPLANT
SUTURE FIBERWR#2 38 REV NDL BL (SUTURE) ×2 IMPLANT
TOWEL GREEN STERILE (TOWEL DISPOSABLE) ×2 IMPLANT
TOWER SMARTMIX MINI (MISCELLANEOUS) ×1 IMPLANT
WATER STERILE IRR 1000ML POUR (IV SOLUTION) ×2 IMPLANT
YANKAUER SUCT BULB TIP NO VENT (SUCTIONS) ×2 IMPLANT

## 2018-09-29 NOTE — Transfer of Care (Signed)
Immediate Anesthesia Transfer of Care Note  Patient: Gregory Carey  Procedure(s) Performed: TOTAL SHOULDER ARTHROPLASTY (Left Shoulder)  Patient Location: PACU  Anesthesia Type:General  Level of Consciousness: oriented, drowsy and patient cooperative  Airway & Oxygen Therapy: Patient Spontanous Breathing and Patient connected to face mask oxygen  Post-op Assessment: Report given to RN and Post -op Vital signs reviewed and stable  Post vital signs: Reviewed  Last Vitals:  Vitals Value Taken Time  BP 148/123 09/29/18 1023  Temp    Pulse 71 09/29/18 1023  Resp 19 09/29/18 1023  SpO2 97 % 09/29/18 1023  Vitals shown include unvalidated device data.  Last Pain:  Vitals:   09/29/18 0559  PainSc: 0-No pain      Patients Stated Pain Goal: 4 (58/09/98 [id number])  Complications: No apparent anesthesia complications

## 2018-09-29 NOTE — Anesthesia Procedure Notes (Signed)
Anesthesia Regional Block: Interscalene brachial plexus block   Pre-Anesthetic Checklist: ,, timeout performed, Correct Patient, Correct Site, Correct Laterality, Correct Procedure, Correct Position, site marked, Risks and benefits discussed,  Surgical consent,  Pre-op evaluation,  At surgeon's request and post-op pain management  Laterality: Left  Prep: chloraprep       Needles:  Injection technique: Single-shot  Needle Type: Echogenic Stimulator Needle     Needle Length: 5cm  Needle Gauge: 22     Additional Needles:   Procedures:, nerve stimulator,,,,,,,   Nerve Stimulator or Paresthesia:  Response: biceps flexion, 0.45 mA,   Additional Responses:   Narrative:  Start time: 09/29/2018 7:20 AM End time: 09/29/2018 7:25 AM Injection made incrementally with aspirations every 5 mL.  Performed by: Personally  Anesthesiologist: Albertha Ghee, MD  Additional Notes: Functioning IV was confirmed and monitors were applied.  A 30mm 22ga Arrow echogenic stimulator needle was used. Sterile prep and drape,hand hygiene and sterile gloves were used.  Negative aspiration and negative test dose prior to incremental administration of local anesthetic. The patient tolerated the procedure well.  Ultrasound guidance: relevent anatomy identified, needle position confirmed, local anesthetic spread visualized around nerve(s), vascular puncture avoided.  Image printed for medical record.

## 2018-09-29 NOTE — Anesthesia Postprocedure Evaluation (Signed)
Anesthesia Post Note  Patient: Gregory Carey  Procedure(s) Performed: TOTAL SHOULDER ARTHROPLASTY (Left Shoulder)     Anesthesia Post Evaluation  Last Vitals:  Vitals:   09/29/18 1131 09/29/18 1148  BP: 103/66 105/76  Pulse: 62 65  Resp: 12 16  Temp: 36.6 C 36.6 C  SpO2: 98% 93%    Last Pain:  Vitals:   09/29/18 1155  TempSrc:   PainSc: 0-No pain                 Martie Muhlbauer

## 2018-09-29 NOTE — Anesthesia Procedure Notes (Signed)
Procedure Name: Intubation Date/Time: 09/29/2018 7:41 AM Performed by: Jenne Campus, CRNA Pre-anesthesia Checklist: Patient identified, Emergency Drugs available, Suction available and Patient being monitored Patient Re-evaluated:Patient Re-evaluated prior to induction Oxygen Delivery Method: Circle System Utilized Preoxygenation: Pre-oxygenation with 100% oxygen Induction Type: IV induction Ventilation: Mask ventilation without difficulty Laryngoscope Size: Glidescope and 4 Grade View: Grade I Tube type: Oral Tube size: 7.5 mm Number of attempts: 1 Airway Equipment and Method: Stylet,  Oral airway and Video-laryngoscopy Placement Confirmation: ETT inserted through vocal cords under direct vision,  positive ETCO2 and breath sounds checked- equal and bilateral Secured at: 23 cm Tube secured with: Tape Dental Injury: Teeth and Oropharynx as per pre-operative assessment  Difficulty Due To: Difficulty was anticipated, Difficult Airway- due to anterior larynx and Difficult Airway- due to limited oral opening Comments: Elective video-glide. Difficult ariway noted on previous surgery by anesthesiologist.

## 2018-09-29 NOTE — Brief Op Note (Signed)
09/29/2018  9:57 AM  PATIENT:  Irene Limbo  58 y.o. male  PRE-OPERATIVE DIAGNOSIS:  Left shoulder osteoarthritis  POST-OPERATIVE DIAGNOSIS:  Left shoulder osteoarthritis  PROCEDURE:  Procedure(s) with comments: TOTAL SHOULDER ARTHROPLASTY (Left) - 2.5 hrs  SURGEON:  Surgeon(s) and Role:    * Stann Mainland, Elly Modena, MD - Primary  PHYSICIAN ASSISTANT:   ASSISTANTS: Laure Kidney, RNFA   ANESTHESIA:   regional and general  EBL:  150 mL   BLOOD ADMINISTERED:none  DRAINS: none   LOCAL MEDICATIONS USED:  NONE  SPECIMEN:  No Specimen  DISPOSITION OF SPECIMEN:  N/A  COUNTS:  YES  TOURNIQUET:  * No tourniquets in log *  DICTATION: .Note written in EPIC  PLAN OF CARE: Admit to inpatient   PATIENT DISPOSITION:  PACU - hemodynamically stable.   Delay start of Pharmacological VTE agent (>24hrs) due to surgical blood loss or risk of bleeding: not applicable

## 2018-09-29 NOTE — H&P (Signed)
ORTHOPAEDIC H and P  REQUESTING PHYSICIAN: Nicholes Stairs, MD  PCP:  Sharion Balloon, FNP  Chief Complaint: Left shoulder pain HPI: Gregory Carey is a 58 y.o. male who complains of increasing left shoulder pain and stiffness over the last few years.  He has end-stage left shoulder arthritis and is here today for shoulder replacement.  He has no new complaints at this time and no new questions.  We have previously discussed this procedure at length in the office.  Past Medical History:  Diagnosis Date  . Diabetes mellitus without complication ([place])   . Difficult intubation    Was infomed by MD Anesth his airway was small  . Hyperlipidemia   . Hypertension   . Localized osteoarthritis of left shoulder    Past Surgical History:  Procedure Laterality Date  . KNEE ARTHROSCOPY Right 2008  . SHOULDER ARTHROSCOPY Right 2002   Social History   Socioeconomic History  . Marital status: Married    Spouse name: Not on file  . Number of children: Not on file  . Years of education: Not on file  . Highest education level: Not on file  Occupational History  . Not on file  Social Needs  . Financial resource strain: Not on file  . Food insecurity    Worry: Not on file    Inability: Not on file  . Transportation needs    Medical: Not on file    Non-medical: Not on file  Tobacco Use  . Smoking status: Never Smoker  . Smokeless tobacco: Never Used  Substance and Sexual Activity  . Alcohol use: No  . Drug use: No  . Sexual activity: Yes  Lifestyle  . Physical activity    Days per week: Not on file    Minutes per session: Not on file  . Stress: Not on file  Relationships  . Social Herbalist on phone: Not on file    Gets together: Not on file    Attends religious service: Not on file    Active member of club or organization: Not on file    Attends meetings of clubs or organizations: Not on file    Relationship status: Not on file  Other Topics Concern  .  Not on file  Social History Narrative  . Not on file   Family History  Problem Relation Age of Onset  . CAD Father   . Diabetes Father   . Diabetes Paternal Grandmother    No Known Allergies Prior to Admission medications   Medication Sig Start Date End Date Taking? Authorizing Provider  amLODipine (NORVASC) 5 MG tablet Take 1 tablet (5 mg total) by mouth daily. 07/25/18  Yes Hawks, Christy A, FNP  aspirin EC 81 MG tablet Take 1 tablet (81 mg total) by mouth daily. 08/30/15  Yes Hawks, Theador Hawthorne, FNP  blood glucose meter kit and supplies Dispense based on patient and insurance preference. Use up to four times daily as directed. (FOR ICD-9 250.00, 250.01). 07/29/15  Yes Evelina Dun A, FNP  CONTOUR NEXT TEST test strip USE AS DIRECTED TWICE DAILY 08/04/16  Yes Hawks, Christy A, FNP  lisinopril-hydrochlorothiazide (PRINZIDE,ZESTORETIC) 20-12.5 MG tablet Take 2 tablets by mouth daily. 10/26/17  Yes Hawks, Christy A, FNP  metFORMIN (GLUCOPHAGE-XR) 750 MG 24 hr tablet Take 1 tablet by mouth once daily with breakfast Patient taking differently: Take 750 mg by mouth daily with breakfast.  08/09/18  Yes Sharion Balloon, FNP  rosuvastatin (CRESTOR) 20 MG tablet Take 1 tablet by mouth once daily Patient taking differently: Take 20 mg by mouth daily.  07/26/18  Yes Hawks, Christy A, FNP  traMADol (ULTRAM) 50 MG tablet Take 50 mg by mouth every 6 (six) hours as needed (pain.).   Yes [provider]  TRULICITY 8.31 DV/7.6HY SOPN INJECT 0.75MG SUBCUTANEOUSLY ONCE WEEKLY 09/26/18  Yes Hawks, [place] A, FNP   No results found.  Positive ROS: All other systems have been reviewed and were otherwise negative with the exception of those mentioned in the HPI and as above.  Physical Exam: General: Alert, no acute distress Cardiovascular: No pedal edema Respiratory: No cyanosis, no use of accessory musculature GI: No organomegaly, abdomen is soft and non-tender Skin: No lesions in the area of chief  complaint Neurologic: Sensation intact distally Psychiatric: Patient is competent for consent with normal mood and affect Lymphatic: No axillary or cervical lymphadenopathy  MUSCULOSKELETAL:  Left shoulder skin is clean, dry and intact with no open wounds or lesions.  He is neurovascularly intact.  Assessment: Left shoulder osteoarthritis  Plan: -Plan for left shoulder replacement today.  Severe total shoulder arthroplasty anatomic.  -We have previously discussed the risk, benefits, and indications of this procedure at length.  He has provided informed consent to proceed.  - The risks, benefits, and alternatives were discussed with the patient. There are risks associated with the surgery including, but not limited to, problems with anesthesia (death), infection,  fracture of bones, loosening or failure of implants, malunion, nonunion, hematoma (blood accumulation) which may require surgical drainage, blood clots, pulmonary embolism, nerve injury, and blood vessel injury. The patient understands these risks and elects to proceed.  - we will admit to the floor post op for routine care.     Nicholes Stairs, MD Cell [phone]    09/29/2018 7:11 AM

## 2018-09-29 NOTE — Op Note (Signed)
Date of Surgery: 09/29/2018  INDICATIONS: Mr. Bhardwaj is a 58 y.o.-year-old male with a left end stage osteoarthitis of the shoulder;  The patient did consent to the procedure after discussion of the risks and benefits.  PREOPERATIVE DIAGNOSIS:  1. Left shoulder glenohumeral osteoarthritis  POSTOPERATIVE DIAGNOSIS: Same.  PROCEDURE:  1.  Left shoulder anatomic total shoulder arthroplasty  SURGEON: Geralynn Rile, M.D.  ASSIST: Laure Kidney, RNFA.  ANESTHESIA:  general, with regional interscalene block  IV FLUIDS AND URINE: See anesthesia.  ESTIMATED BLOOD LOSS: 150 mL.  IMPLANTS:  Arthrex Vault lock size large glenoid cemented and bone grafted centrally Arthrex eclipse head 49/20 trunion 49 Cage Screw Large Arthrex 2.6 Fibertak suture anchors x 2 and 4.75 mm Swivel lock biocomposite suture anchors x 2 (for [employer] repair)  DRAINS: none  COMPLICATIONS: None.  DESCRIPTION OF PROCEDURE: After obtaining informed consent,The patient was brought to the operating room and placed supine on the operating table.  The patient had been signed prior to the procedure and this was documented. The patient had the anesthesia placed by the anesthesiologist.  A time-out was performed to confirm that this was the correct patient, site, side and location. The patient did receive antibiotics prior to the incision and was re-dosed during the procedure as needed at indicated intervals.   The patient had the operative extremity prepped and draped in the standard surgical fashion.   The head and neck were nicely stabilized.   A 10 blade was used to make a standard deltopectoral approach to the shoulder.  Dissection was carried out through the subcutaneous tissue to where the deltopectoral interval was visualized and developed.  The cephalic vein was mobilized and retracted medially for the duration of the case.  The subacromial space was cleared bluntly retractors were placed appropriately deep to the  pectoralis major tendon and deep to the deltoid muscle fascia.  The upper one third of the pectoralis muscle insertion on the humerus was sharply released.  The Rotator cuff tendons were all intact x 4.  Next I performed a subscapularis takedown and a peel fashion.  Subscapularis, rotator interval, and the inferior capsule were all released.  There were inferior humeral osteophytes that were removed with rongeur and curved osteotome.  After, Releases, a humeral head osteotomy was performed in 30 of retroversion and at 135 head neck angle.    We then sized the humerus at a 49, and moved to the glenoid.  I then placed deep retractors and exposed the glenoid. I excised the labrum circumferentially, taking care to protect the axillary nerve inferiorly. We then retrieved a large glenohumeral loose body that was adjacent to the inferior calcar.  This was approximately 4 cm x 3 cm.  He did also have intramuscular loose bodies within the subscapularis.  After performing 360 degree subscapularis releases we realized that these loose bodies were tethered to the musculature and did not elect to remove them to reduce the trauma to the subscapularis muscle itself.  I then placed a guidewire into the center position utilizing the preoperative templated guide handle from [employer], controlling appropriate version and inclination. I then reamed over the guidewire with the large reamer, and was satisfied with the preparation. I preserved the subchondral bone in order to maximize the strength and minimize the risk for subsequent subsidence.   I then drilled the central hole for the, and then placed the guide, and then drilled the 3 peripheral peg holes. I had excellent bony circumferential contact.  I then cleaned the glenoid, irrigated it copiously, and then dried it and cemented the prosthesis into place. Excellent seating was achieved. I had full exposure. The cement cured while I turned my attention to the humeral  side.   Next we prepared the humerus for Eclipse stemless implant.  This was done per manufactuer protocol and found that the large cage was appropriate.  The base plate for the 49 size was impacted into place, and next the cage screw placed and found to have excellent purchase.  We then trialed heads and found the 49/20 to be appropriate with 50% push back and no block with 40 degrees of abduction or 60 degrees internal rotation.  I then impacted the real humeral head into place, reduced the head, and irrigated copiously. Excellent stability and range of motion was achieved. I repaired the subscapularis with a speed bridge double row technique.  Medially the superior anchor was a double loaded 2.6 mm FiberTak, and inferior medially.  Next we passed and tied our medial row.  Then the free ends were passed into 2 lateral row 4.75 mm arthrex anchors, along with 2 horizontal mattress rip stop #2 Fiberwires that were previoiusly passed for the subscapularis retraction. The rotator interval was closed with #2 Fiberwire in an anatomic fashion.   Excellent repair achieved and I irrigated copiously once more. The subcutaneous tissue was closed with Vicryl including the deltopectoral fascia.   The skin was closed in layers with 2-0 Vicryl and 3-0 Monocryl with Steri-Strips and sterile gauze was applied. He had a preoperative nerve block. He tolerated the procedure well and there were no complications.  He was extubated without any noted complications and transported to PACU in stable condition.   The patient tolerated the procedure well.  All counts were correct 2.    Disposition: The patient will be nonweightbearing to the operative extremity for proximally 4-6 weeks.  He will begin physical therapy in the outpatient setting.  He will be admitted for routine postoperative care including antibiotics, pain control, and DVT prophylaxis.  Sling will be maintained at all times.  Return for wound check in  approximately 2 weeks and then again at 6 weeks.

## 2018-09-30 ENCOUNTER — Other Ambulatory Visit: Payer: Self-pay | Admitting: Family

## 2018-09-30 DIAGNOSIS — E119 Type 2 diabetes mellitus without complications: Secondary | ICD-10-CM | POA: Diagnosis not present

## 2018-09-30 DIAGNOSIS — E785 Hyperlipidemia, unspecified: Secondary | ICD-10-CM | POA: Diagnosis not present

## 2018-09-30 DIAGNOSIS — I10 Essential (primary) hypertension: Secondary | ICD-10-CM

## 2018-09-30 DIAGNOSIS — Z7982 Long term (current) use of aspirin: Secondary | ICD-10-CM | POA: Diagnosis not present

## 2018-09-30 DIAGNOSIS — Z7984 Long term (current) use of oral hypoglycemic drugs: Secondary | ICD-10-CM | POA: Diagnosis not present

## 2018-09-30 DIAGNOSIS — Z79899 Other long term (current) drug therapy: Secondary | ICD-10-CM | POA: Diagnosis not present

## 2018-09-30 DIAGNOSIS — M19012 Primary osteoarthritis, left shoulder: Secondary | ICD-10-CM | POA: Diagnosis not present

## 2018-09-30 LAB — GLUCOSE, CAPILLARY
Glucose-Capillary: 123 mg/dL — ABNORMAL HIGH (ref 70–99)
Glucose-Capillary: 108 mg/dL — ABNORMAL HIGH (ref 70–99)

## 2018-09-30 MED ORDER — OXYCODONE HCL 5 MG PO TABS: 5.0000 mg | ORAL_TABLET | Freq: Four times a day (QID) | ORAL | 0 refills | Status: DC | PRN

## 2018-09-30 MED ORDER — ONDANSETRON 4 MG PO TBDP: 4.0000 mg | ORAL_TABLET | Freq: Three times a day (TID) | ORAL | 0 refills | Status: DC | PRN

## 2018-09-30 NOTE — Progress Notes (Signed)
Patient aware.

## 2018-09-30 NOTE — Progress Notes (Signed)
   Subjective:  Patient reports pain as mild to moderate.  Doing very well.  Block has resolved.  No complaints  Objective:   VITALS:   Vitals:   09/29/18 2308 09/30/18 0339 09/30/18 0733 09/30/18 1241  BP: 108/69 114/63 113/71 114/75  Pulse: 66 64 67 (!) 56  Resp: 20 20 16 16   Temp: 98.4 F (36.9 C) 98.2 F (36.8 C) 97.7 F (36.5 C) 97.9 F (36.6 C)  TempSrc: Oral Oral Oral Oral  SpO2: 93% 94% 94% 96%    Neurologically intact Neurovascular intact Sensation intact distally Intact pulses distally Incision: dressing C/D/I Compartment soft   Lab Results  Component Value Date   WBC 7.3 09/26/2018   HGB 15.7 09/26/2018   HCT 43.5 09/26/2018   MCV 87.7 09/26/2018   PLT 580 (H) 09/26/2018   BMET    Component Value Date/Time   NA 142 09/26/2018 0906   NA 143 08/29/2018 0924   K 4.8 09/26/2018 0906   CL 106 09/26/2018 0906   CO2 24 09/26/2018 0906   GLUCOSE 148 (H) 09/26/2018 0906   BUN 17 09/26/2018 0906   BUN 13 08/29/2018 0924   CREATININE 0.96 09/26/2018 0906   CALCIUM 9.1 09/26/2018 0906   GFRNONAA >60 09/26/2018 0906   GFRAA >60 09/26/2018 0906     Assessment/Plan: 1 Day Post-Op   Active Problems:   Osteoarthritis of left shoulder   History of left shoulder replacement   Up with therapy NWB, sling Dc home today Follow up 2 weeks   Nicholes Stairs 09/30/2018, 1:17 PM   Geralynn Rile, MD [phone]

## 2018-09-30 NOTE — Progress Notes (Signed)
Sleep study ordered as he had an elevated score on screening tool.

## 2018-09-30 NOTE — Discharge Instructions (Signed)
Maintain sling to left arm, unless doing exercises, or showering - no weight bearing to left arm - maintain post op dressing until follow up appointment.  Ok to shower with this in place - for pain use tylenol and advil around the clock.  For breakthrough pain use Tramadol or Oxycodone as needed - apply ice to the left shoulder for 30 minutes per hour you are awake - return to see Dr. Stann Mainland in 2 weeks

## 2018-09-30 NOTE — Evaluation (Signed)
Occupational Therapy Evaluation Patient Details Name: Gregory Carey MRN: [id number] DOB: 02-Oct-1960 Today's Date: 09/30/2018    History of Present Illness 58 yo male s/p L  TSA PMH  has a past medical history of Diabetes mellitus without complication ([place]), Difficult intubation, Hyperlipidemia, Hypertension, Localized osteoarthritis of left shoulder, and Osteoarthritis of left shoulder (09/29/2018).    Clinical Impression   Patient evaluated by Occupational Therapy with no further acute OT needs identified. All education has been completed and the patient has no further questions. See below for any follow-up Occupational Therapy or equipment needs. OT to sign off. Thank you for referral.      Follow Up Recommendations  Follow surgeon's recommendation for DC plan and follow-up therapies    Equipment Recommendations  None recommended by OT    Recommendations for Other Services       Precautions / Restrictions Precautions Precautions: Shoulder Shoulder Interventions: At all times;Off for dressing/bathing/exercises Precaution Comments: handout provided for shoulder protocol and hand wrist digit AROM only Required Braces or Orthoses: Sling Restrictions Weight Bearing Restrictions: Yes LUE Weight Bearing: Non weight bearing      Mobility Bed Mobility Overal bed mobility: Modified Independent                Transfers Overall transfer level: Modified independent               General transfer comment: education on avoiding the use of L UE at shoulder.    Balance                                           ADL either performed or assessed with clinical judgement   ADL Overall ADL's : Needs assistance/impaired                 Upper Body Dressing : Moderate assistance;Standing Upper Body Dressing Details (indicate cue type and reason): pt with shirt that wife has modified with velcro. pt advised to sit to complete task. pt attempting to  stand initially. pt is unable to latch the velcro. it woudl be easier to thread the shirt on with velcro in place. pt will have wife (A) upon d/c Pt is able ot don sling mod I     Toilet Transfer: Modified Independent           Functional mobility during ADLs: Modified independent General ADL Comments: educated for bathing / dressing complete      Vision Baseline Vision/History: Wears glasses Wears Glasses: At all times       Perception     Praxis      Pertinent Vitals/Pain Pain Assessment: No/denies pain     Hand Dominance Right   Extremity/Trunk Assessment Upper Extremity Assessment Upper Extremity Assessment: LUE deficits/detail LUE Deficits / Details: s/p surgery       Cervical / Trunk Assessment Cervical / Trunk Assessment: Normal   Communication Communication Communication: No difficulties   Cognition Arousal/Alertness: Awake/alert Behavior During Therapy: WFL for tasks assessed/performed Overall Cognitive Status: Within Functional Limits for tasks assessed                                     General Comments  dressing dry and intact. pt provided education on wound care,. washing around incision and bandage is water proof. pt needs mod  cues during session to avoid use of L UE with AROM shoulder.     Exercises Exercises: Shoulder Shoulder Exercises Elbow Flexion: AROM;5 reps;Seated Wrist Flexion: AROM;5 reps;Seated Neck Flexion: AROM Neck Extension: AROM Neck Lateral Flexion - Right: AROM Neck Lateral Flexion - Left: AROM   Shoulder Instructions Shoulder Instructions Donning/doffing shirt without moving shoulder: Minimal assistance;Patient able to independently direct caregiver Method for sponge bathing under operated UE: Modified independent Donning/doffing sling/immobilizer: Modified independent Correct positioning of sling/immobilizer: Modified independent ROM for elbow, wrist and digits of operated UE: Modified independent Sling  wearing schedule (on at all times/off for ADL's): Modified independent Proper positioning of operated UE when showering: Modified independent Positioning of UE while sleeping: Modified independent    Home Living Family/patient expects to be discharged to:: Private residence Living Arrangements: Spouse/significant other Available Help at Discharge: Family;Available 24 hours/day Type of Home: House Home Access: Stairs to enter [employer] of Steps: 2   Home Layout: One level     Bathroom Shower/Tub: Occupational psychologist: Standard     Home Equipment: None   Additional Comments: no animals      Prior Functioning/Environment Level of Independence: Independent        Comments: 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 Pt concerned with return to work and educated on positioning in chair for pain management        OT Problem List:        OT Treatment/Interventions:      OT Goals(Current goals can be found in the care plan section) Acute Rehab OT Goals Patient Stated Goal: to go home with wife and return to work in next two weeks Potential to Achieve Goals: Good  OT Frequency:     Barriers to D/C:            Co-evaluation              AM-PAC OT "6 Clicks" Daily Activity     Outcome Measure Help from another person eating meals?: None Help from another person taking care of personal grooming?: None Help from another person toileting, which includes using toliet, bedpan, or urinal?: None Help from another person bathing (including washing, rinsing, drying)?: A Little Help from another person to put on and taking off regular upper body clothing?: A Little Help from another person to put on and taking off regular lower body clothing?: A Little 6 Click Score: 21   End of Session Nurse Communication: Mobility status;Weight bearing status  Activity Tolerance: Patient tolerated treatment well Patient left: in chair;with  call bell/phone within reach  OT Visit Diagnosis: Unsteadiness on feet (R26.81)                Time: 0912-0930 OT Time Calculation (min): 18 min Charges:  OT General Charges $OT Visit: 1 Visit OT Evaluation $OT Eval Moderate Complexity: 1 Mod   Jeri Modena, OTR/L  Acute Rehabilitation Services Pager: [phone] Office: [phone] .   Jeri Modena 09/30/2018, 10:19 AM

## 2018-09-30 NOTE — Plan of Care (Signed)
Patient alert and oriented, mae's well, voiding adequate amount of urine, swallowing without difficulty, no c/o pain at time of discharge. Patient discharged home with family. Script and discharged instructions given to patient. Patient and family stated understanding of instructions given. Patient has an appointment with Dr. Rogers   

## 2018-09-30 NOTE — Discharge Summary (Signed)
Patient ID: Gregory Carey MRN: [id number] DOB/AGE: [birth year] 58 y.o.  Admit date: 09/29/2018 Discharge date:   Primary Diagnosis: Left shoulder  OA  Admission Diagnoses:  Past Medical History:  Diagnosis Date  . Diabetes mellitus without complication ([place])   . Difficult intubation    Was infomed by MD Anesth his airway was small  . Hyperlipidemia   . Hypertension   . Localized osteoarthritis of left shoulder   . Osteoarthritis of left shoulder 09/29/2018   Discharge Diagnoses:   Active Problems:   Osteoarthritis of left shoulder   History of left shoulder replacement  Estimated body mass index is 37.36 kg/m as calculated from the following:   Height as of 09/26/18: _0  (1.803 m).   Weight as of 09/26/18: 121.5 kg.  Procedure:  Procedure(s) (LRB): TOTAL SHOULDER ARTHROPLASTY (Left)   Consults: None  HPI: Admitted to hospital for left shoulder replacement and routine post operative care. Laboratory Data: Admission on 09/29/2018  Component Date Value Ref Range Status  . Glucose-Capillary 09/29/2018 100* 70 - 99 mg/dL Final  . Glucose-Capillary 09/29/2018 141* 70 - 99 mg/dL Final  . Glucose-Capillary 09/29/2018 177* 70 - 99 mg/dL Final  . Comment 1 09/29/2018 Notify RN   Final  . Comment 2 09/29/2018 Document in Chart   Final  . Glucose-Capillary 09/29/2018 154* 70 - 99 mg/dL Final  . Glucose-Capillary 09/30/2018 123* 70 - 99 mg/dL Final  . Glucose-Capillary 09/30/2018 108* 70 - 99 mg/dL Final  . Comment 1 09/30/2018 Notify RN   Final  . Comment 2 09/30/2018 Document in Chart   Final  Hospital Outpatient Visit on 09/26/2018  Component Date Value Ref Range Status  . SARS Coronavirus 2 09/26/2018 NEGATIVE  NEGATIVE Final   Comment: (NOTE) SARS-CoV-2 target nucleic acids are NOT DETECTED. The SARS-CoV-2 RNA is generally detectable in upper and lower respiratory specimens during the acute phase of infection. Negative results do not preclude SARS-CoV-2 infection, do  not rule out co-infections with other pathogens, and should not be used as the sole basis for treatment or other patient management decisions. Negative results must be combined with clinical observations, patient history, and epidemiological information. The expected result is Negative. Fact Sheet for Patients: SugarRoll.be Fact Sheet for Healthcare Providers: https://www.woods-mathews.com/ This test is not yet approved or cleared by the [place] FDA and  has been authorized for detection and/or diagnosis of SARS-CoV-2 by FDA under an Emergency Use Authorization (EUA). This EUA will remain  in effect (meaning this test can be used) for the duration of the COVID-19 declaration under Section 56                          4(b)(1) of the Act, 21 U.S.C. section 360bbb-3(b)(1), unless the authorization is terminated or revoked sooner. Performed at [facility], [place] [address] [place], [place] [address]   Hospital Outpatient Visit on 09/26/2018  Component Date Value Ref Range Status  . Glucose-Capillary 09/26/2018 189* 70 - 99 mg/dL Final  . Comment 1 09/26/2018 Notify RN   Final  . Comment 2 09/26/2018 Document in Chart   Final  . Sodium 09/26/2018 142  135 - 145 mmol/L Final  . Potassium 09/26/2018 4.8  3.5 - 5.1 mmol/L Final  . Chloride 09/26/2018 106  98 - 111 mmol/L Final  . CO2 09/26/2018 24  22 - 32 mmol/L Final  . Glucose, Bld 09/26/2018 148* 70 - 99 mg/dL Final  . BUN  09/26/2018 17  6 - 20 mg/dL Final  . Creatinine, Ser 09/26/2018 0.96  0.61 - 1.24 mg/dL Final  . Calcium 09/26/2018 9.1  8.9 - 10.3 mg/dL Final  . GFR calc non Af Amer 09/26/2018 >60  >60 mL/min Final  . GFR calc Af Amer 09/26/2018 >60  >60 mL/min Final  . Anion gap 09/26/2018 12  5 - 15 Final   Performed at [facility], [place] [address] [place], [place] [address]  . WBC 09/26/2018 7.3  4.0 - 10.5 K/uL Final  . RBC 09/26/2018 4.96  4.22 - 5.81 MIL/uL Final   . Hemoglobin 09/26/2018 15.7  13.0 - 17.0 g/dL Final  . HCT 09/26/2018 43.5  39.0 - 52.0 % Final  . MCV 09/26/2018 87.7  80.0 - 100.0 fL Final  . MCH 09/26/2018 31.7  26.0 - 34.0 pg Final  . MCHC 09/26/2018 36.1* 30.0 - 36.0 g/dL Final  . RDW 09/26/2018 12.7  11.5 - 15.5 % Final  . Platelets 09/26/2018 580* 150 - 400 K/uL Final  . nRBC 09/26/2018 1.8* 0.0 - 0.2 % Final   Performed at [facility], [place] [address], [place], [place] [address]  . MRSA, PCR 09/26/2018 NEGATIVE  NEGATIVE Final  . Staphylococcus aureus 09/26/2018 NEGATIVE  NEGATIVE Final   Comment: (NOTE) The Xpert SA Assay (FDA approved for NASAL specimens in patients 32 years of age and older), is one component of a comprehensive surveillance program. It is not intended to diagnose infection nor to guide or monitor treatment. Performed at [facility], [place] [address] [place], [place] [address]      X-Rays:Dg Shoulder Left Port  Result Date: 09/29/2018 CLINICAL DATA:  Status post left shoulder replacement. EXAM: LEFT SHOULDER - 1 VIEW COMPARISON:  None. FINDINGS: Left humeral prosthesis appears to be well situated. No fracture or dislocation is noted. Visualized ribs are unremarkable. IMPRESSION: Status post left shoulder arthroplasty. Electronically Signed   By: Marijo Conception M.D.   On: 09/29/2018 12:29    EKG: Orders placed or performed during the hospital encounter of 09/26/18  . EKG 12-Lead  . EKG 12-Lead     Hospital Course: Gregory Carey is a 58 y.o. who was admitted to Hospital. They were brought to the operating room on 09/29/2018 and underwent Procedure(s): TOTAL SHOULDER ARTHROPLASTY.  Patient tolerated the procedure well and was later transferred to the recovery room and then to the orthopaedic floor for postoperative care.  They were given PO and IV analgesics for pain control following their surgery.  They were given 24 hours of postoperative antibiotics of  Anti-infectives (From admission,  onward)   Start     Dose/Rate Route Frequency Ordered Stop   09/29/18 1400  ceFAZolin (ANCEF) IVPB 2g/100 mL premix     2 g 200 mL/hr over 30 Minutes Intravenous Every 6 hours 09/29/18 1153 09/30/18 0157   09/29/18 0814  vancomycin (VANCOCIN) powder  Status:  Discontinued       As needed 09/29/18 0814 09/29/18 1016   09/29/18 0600  ceFAZolin (ANCEF) IVPB 2g/100 mL premix     2 g 200 mL/hr over 30 Minutes Intravenous On call to O.R. 09/29/18 0547 09/29/18 0800     and started on DVT prophylaxis in the form of Aspirin.   OT was ordered.   Incision was healing well.  Patient was seen in rounds and was ready to go home, on POD 1   Diet: Regular diet Activity:NWB Follow-up:in  2 weeks Disposition - Home Discharged Condition: good   Discharge Instructions    Call MD / Call 911   Complete by: As directed    If you experience chest pain or shortness of breath, CALL 911 and be transported to the hospital emergency room.  If you develope a fever above 101 F, pus (white drainage) or increased drainage or redness at the wound, or calf pain, call your surgeon's office.   Constipation Prevention   Complete by: As directed    Drink plenty of fluids.  Prune juice may be helpful.  You may use a stool softener, such as Colace (over the counter) 100 mg twice a day.  Use MiraLax (over the counter) for constipation as needed.   Diet - low sodium heart healthy   Complete by: As directed    Increase activity slowly as tolerated   Complete by: As directed      Allergies as of 09/30/2018   No Known Allergies     Medication List    TAKE these medications   amLODipine 5 MG tablet Commonly known as: NORVASC Take 1 tablet (5 mg total) by mouth daily.   aspirin EC 81 MG tablet Take 1 tablet (81 mg total) by mouth daily.   blood glucose meter kit and supplies Dispense based on patient and insurance preference. Use up to four times daily as directed. (FOR ICD-9 250.00, 250.01).   Contour Next Test  test strip Generic drug: glucose blood USE AS DIRECTED TWICE DAILY   lisinopril-hydrochlorothiazide 20-12.5 MG tablet Commonly known as: ZESTORETIC Take 2 tablets by mouth daily.   metFORMIN 750 MG 24 hr tablet Commonly known as: GLUCOPHAGE-XR Take 1 tablet by mouth once daily with breakfast What changed: See the new instructions.   ondansetron 4 MG disintegrating tablet Commonly known as: Zofran ODT Take 1 tablet (4 mg total) by mouth every 8 (eight) hours as needed.   oxyCODONE 5 MG immediate release tablet Commonly known as: Roxicodone Take 1 tablet (5 mg total) by mouth every 6 (six) hours as needed for severe pain.   rosuvastatin 20 MG tablet Commonly known as: CRESTOR Take 1 tablet by mouth once daily   traMADol 50 MG tablet Commonly known as: ULTRAM Take 50 mg by mouth every 6 (six) hours as needed (pain.).   Trulicity 4.69 GE/9.5MW Sopn Generic drug: Dulaglutide INJECT 0.75MG SUBCUTANEOUSLY ONCE WEEKLY      Follow-up Information    Nicholes Stairs, MD In 2 weeks.   Specialty: Orthopedic Surgery Why: For wound re-check Contact information: [address] [place] [address] [phone]           Signed: Geralynn Rile, MD Orthopaedic Surgery 09/30/2018, 1:21 PM

## 2018-10-03 ENCOUNTER — Encounter (HOSPITAL_COMMUNITY): Payer: Self-pay | Admitting: Orthopedic Surgery

## 2018-10-06 DIAGNOSIS — M25512 Pain in left shoulder: Secondary | ICD-10-CM | POA: Diagnosis not present

## 2018-10-12 DIAGNOSIS — M25512 Pain in left shoulder: Secondary | ICD-10-CM | POA: Diagnosis not present

## 2018-10-18 DIAGNOSIS — M25512 Pain in left shoulder: Secondary | ICD-10-CM | POA: Diagnosis not present

## 2018-10-21 ENCOUNTER — Other Ambulatory Visit: Payer: Self-pay | Admitting: Family

## 2018-10-21 DIAGNOSIS — E1165 Type 2 diabetes mellitus with hyperglycemia: Secondary | ICD-10-CM

## 2018-10-21 DIAGNOSIS — Z794 Long term (current) use of insulin: Secondary | ICD-10-CM

## 2018-10-24 ENCOUNTER — Other Ambulatory Visit: Payer: Self-pay | Admitting: Family

## 2018-10-24 DIAGNOSIS — I10 Essential (primary) hypertension: Secondary | ICD-10-CM

## 2018-10-26 DIAGNOSIS — M25512 Pain in left shoulder: Secondary | ICD-10-CM | POA: Diagnosis not present

## 2018-11-01 DIAGNOSIS — M25512 Pain in left shoulder: Secondary | ICD-10-CM | POA: Diagnosis not present

## 2018-11-08 ENCOUNTER — Other Ambulatory Visit: Payer: Self-pay

## 2018-11-08 ENCOUNTER — Ambulatory Visit (INDEPENDENT_AMBULATORY_CARE_PROVIDER_SITE_OTHER): Payer: BC Managed Care – PPO | Admitting: Pulmonary Disease

## 2018-11-08 ENCOUNTER — Encounter: Payer: Self-pay | Admitting: Pulmonary Disease

## 2018-11-08 VITALS — BP 130/80 | HR 78 | Temp 96.4°F | Ht 71.0 in | Wt 268.2 lb

## 2018-11-08 DIAGNOSIS — M25512 Pain in left shoulder: Secondary | ICD-10-CM | POA: Diagnosis not present

## 2018-11-08 DIAGNOSIS — G4730 Sleep apnea, unspecified: Secondary | ICD-10-CM | POA: Diagnosis not present

## 2018-11-08 NOTE — Progress Notes (Signed)
Gregory Carey    [id number]    1961-01-12  Primary Care Physician:Hawks, Theador Hawthorne, FNP  Referring Physician: Sharion Balloon, [place],  [place] [address]  Chief complaint:   Patient with a history of snoring  HPI:  His wife has told him that he snores May have at some point mentioned that he may be holding his breath  He is not concerned about his breathing He has hypertension which he feels is controlled  Usually goes to bed about 9:51 PM, takes him about 10 to 15 minutes to fall asleep Wakes up about 2-3 times a night Final awakening time about 4:30 AM  He feels he wakes up feeling fine Denies a headache denies any dryness of his mouth No gasping respirations at night Denies night sweats His memory is fine  No family history of obstructive sleep apnea known to him  Sleep environment is conducive to sleep  Never smoker  No pertinent occupational history  He is not sleepy during the day  Outpatient Encounter Medications as of 11/08/2018  Medication Sig  . amLODipine (NORVASC) 5 MG tablet Take 1 tablet (5 mg total) by mouth daily.  Marland Kitchen aspirin EC 81 MG tablet Take 1 tablet (81 mg total) by mouth daily.  . blood glucose meter kit and supplies Dispense based on patient and insurance preference. Use up to four times daily as directed. (FOR ICD-9 250.00, 250.01).  . CONTOUR NEXT TEST test strip USE AS DIRECTED TWICE DAILY  . lisinopril-hydrochlorothiazide (ZESTORETIC) 20-12.5 MG tablet Take 2 tablets by mouth once daily  . metFORMIN (GLUCOPHAGE-XR) 750 MG 24 hr tablet Take 1 tablet by mouth once daily with breakfast (Patient taking differently: Take 750 mg by mouth daily with breakfast. )  . oxyCODONE (ROXICODONE) 5 MG immediate release tablet Take 1 tablet (5 mg total) by mouth every 6 (six) hours as needed for severe pain.  . rosuvastatin (CRESTOR) 20 MG tablet Take 1 tablet by mouth once daily (Patient taking differently: Take 20 mg by  mouth daily. )  . traMADol (ULTRAM) 50 MG tablet Take 50 mg by mouth every 6 (six) hours as needed (pain.).  Marland Kitchen TRULICITY 8.14 GY/1.8HU SOPN INJECT 0.75MG SUBCUTANEOUSLY ONCE WEEKLY  . [DISCONTINUED] ondansetron (ZOFRAN ODT) 4 MG disintegrating tablet Take 1 tablet (4 mg total) by mouth every 8 (eight) hours as needed.   No facility-administered encounter medications on file as of 11/08/2018.     Allergies as of 11/08/2018  . (No Known Allergies)    Past Medical History:  Diagnosis Date  . Diabetes mellitus without complication ([place])   . Difficult intubation    Was infomed by MD Anesth his airway was small  . Hyperlipidemia   . Hypertension   . Localized osteoarthritis of left shoulder   . Osteoarthritis of left shoulder 09/29/2018    Past Surgical History:  Procedure Laterality Date  . KNEE ARTHROSCOPY Right 2008  . SHOULDER ARTHROSCOPY Right 2002  . TOTAL SHOULDER ARTHROPLASTY Left 09/29/2018   Procedure: TOTAL SHOULDER ARTHROPLASTY;  Surgeon: Nicholes Stairs, MD;  Location: [facility];  Service: Orthopedics;  Laterality: Left;  2.5 hrs    Family History  Problem Relation Age of Onset  . CAD Father   . Diabetes Father   . Diabetes Paternal Grandmother     Social History   Socioeconomic History  . Marital status: Married    Spouse name: Not on file  .  Number of children: Not on file  . Years of education: Not on file  . Highest education level: Not on file  Occupational History  . Not on file  Social Needs  . Financial resource strain: Not on file  . Food insecurity    Worry: Not on file    Inability: Not on file  . Transportation needs    Medical: Not on file    Non-medical: Not on file  Tobacco Use  . Smoking status: Never Smoker  . Smokeless tobacco: Never Used  Substance and Sexual Activity  . Alcohol use: No  . Drug use: No  . Sexual activity: Yes  Lifestyle  . Physical activity    Days per week: Not on file    Minutes per session: Not on file  .  Stress: Not on file  Relationships  . Social Herbalist on phone: Not on file    Gets together: Not on file    Attends religious service: Not on file    Active member of club or organization: Not on file    Attends meetings of clubs or organizations: Not on file    Relationship status: Not on file  . Intimate partner violence    Fear of current or ex partner: Not on file    Emotionally abused: Not on file    Physically abused: Not on file    Forced sexual activity: Not on file  Other Topics Concern  . Not on file  Social History Narrative  . Not on file    Review of Systems  Constitutional: Negative.   HENT: Negative.   Eyes: Negative.   Respiratory: Negative.  Negative for choking.   Cardiovascular: Negative.   Gastrointestinal: Negative.   Endocrine: Negative.   Genitourinary: Negative.   Musculoskeletal: Negative.        Recent shoulder surgery  All other systems reviewed and are negative.   Vitals:   11/08/18 1525 11/08/18 1526  BP:  130/80  Pulse:  78  Temp: (!) 96.4 F (35.8 C)   SpO2:  95%     Physical Exam  Constitutional: He is oriented to person, place, and time. He appears well-developed and well-nourished.  HENT:  Head: Normocephalic.  Eyes: Pupils are equal, round, and reactive to light. Conjunctivae and EOM are normal. Right eye exhibits no discharge. Left eye exhibits no discharge.  Crowded oropharynx, Mallampati 3  Neck: Normal range of motion. Neck supple. No tracheal deviation present. No thyromegaly present.  Cardiovascular: Normal rate and regular rhythm.  Pulmonary/Chest: Effort normal and breath sounds normal. No respiratory distress. He has no wheezes. He has no rales. He exhibits no tenderness.  Abdominal: Soft. Bowel sounds are normal. He exhibits no distension. There is no abdominal tenderness. There is no rebound.  Musculoskeletal: Normal range of motion.        General: No deformity or edema.  Neurological: He is alert and  oriented to person, place, and time. He has normal reflexes. No cranial nerve deficit. Coordination normal.  Skin: Skin is warm and dry. No erythema.  Psychiatric: He has a normal mood and affect.   No flowsheet data found. Epworth Sleepiness Scale of 0   Assessment:  Moderate probability of significant sleep disordered breathing -Snoring and questionable apneas -Obesity -Crowded oropharynx  Thick neck  Obesity   Plan/Recommendations:  Pathophysiology of sleep disordered breathing discussed with the patient  Treatment options for sleep disordered breathing discussed with the patient  We will  schedule patient for home sleep study  We will see him back in the office in about 3 months  Sherrilyn Rist MD [facility] 11/08/2018, 3:42 PM  CC: Sharion Balloon, FNP

## 2018-11-08 NOTE — Patient Instructions (Signed)
Concern for obstructive sleep apnea  We will schedule you for testing 3 to 4 weeks  We will apprise you of results as soon as reviewed  With mild sleep apnea if you do not have a whole lot of symptoms, does not need treated Weight loss, sleep position modification will help  I will see you back in the office in about 3 months Call with significant concerns Sleep Apnea Sleep apnea is a condition in which breathing pauses or becomes shallow during sleep. Episodes of sleep apnea usually last 10 seconds or longer, and they may occur as many as 20 times an hour. Sleep apnea disrupts your sleep and keeps your body from getting the rest that it needs. This condition can increase your risk of certain health problems, including:  Heart attack.  Stroke.  Obesity.  Diabetes.  Heart failure.  Irregular heartbeat. What are the causes? There are three kinds of sleep apnea:  Obstructive sleep apnea. This kind is caused by a blocked or collapsed airway.  Central sleep apnea. This kind happens when the part of the brain that controls breathing does not send the correct signals to the muscles that control breathing.  Mixed sleep apnea. This is a combination of obstructive and central sleep apnea. The most common cause of this condition is a collapsed or blocked airway. An airway can collapse or become blocked if:  Your throat muscles are abnormally relaxed.  Your tongue and tonsils are larger than normal.  You are overweight.  Your airway is smaller than normal. What increases the risk? You are more likely to develop this condition if you:  Are overweight.  Smoke.  Have a smaller than normal airway.  Are elderly.  Are male.  Drink alcohol.  Take sedatives or tranquilizers.  Have a family history of sleep apnea. What are the signs or symptoms? Symptoms of this condition include:  Trouble staying asleep.  Daytime sleepiness and tiredness.  Irritability.  Loud snoring.   Morning headaches.  Trouble concentrating.  Forgetfulness.  Decreased interest in sex.  Unexplained sleepiness.  Mood swings.  Personality changes.  Feelings of depression.  Waking up often during the night to urinate.  Dry mouth.  Sore throat. How is this diagnosed? This condition may be diagnosed with:  A medical history.  A physical exam.  A series of tests that are done while you are sleeping (sleep study). These tests are usually done in a sleep lab, but they may also be done at home. How is this treated? Treatment for this condition aims to restore normal breathing and to ease symptoms during sleep. It may involve managing health issues that can affect breathing, such as high blood pressure or obesity. Treatment may include:  Sleeping on your side.  Using a decongestant if you have nasal congestion.  Avoiding the use of depressants, including alcohol, sedatives, and narcotics.  Losing weight if you are overweight.  Making changes to your diet.  Quitting smoking.  Using a device to open your airway while you sleep, such as: ? An oral appliance. This is a custom-made mouthpiece that shifts your lower jaw forward. ? A continuous positive airway pressure (CPAP) device. This device blows air through a mask when you breathe out (exhale). ? A nasal expiratory positive airway pressure (EPAP) device. This device has valves that you put into each nostril. ? A bi-level positive airway pressure (BPAP) device. This device blows air through a mask when you breathe in (inhale) and breathe out (exhale).  Having surgery if other treatments do not work. During surgery, excess tissue is removed to create a wider airway. It is important to get treatment for sleep apnea. Without treatment, this condition can lead to:  High blood pressure.  Coronary artery disease.  In men, an inability to achieve or maintain an erection (impotence).  Reduced thinking abilities. Follow  these instructions at home: Lifestyle  Make any lifestyle changes that your health care provider recommends.  Eat a healthy, well-balanced diet.  Take steps to lose weight if you are overweight.  Avoid using depressants, including alcohol, sedatives, and narcotics.  Do not use any products that contain nicotine or tobacco, such as cigarettes, e-cigarettes, and chewing tobacco. If you need help quitting, ask your health care provider. General instructions  Take over-the-counter and prescription medicines only as told by your health care provider.  If you were given a device to open your airway while you sleep, use it only as told by your health care provider.  If you are having surgery, make sure to tell your health care provider you have sleep apnea. You may need to bring your device with you.  Keep all follow-up visits as told by your health care provider. This is important. Contact a health care provider if:  The device that you received to open your airway during sleep is uncomfortable or does not seem to be working.  Your symptoms do not improve.  Your symptoms get worse. Get help right away if:  You develop: ? Chest pain. ? Shortness of breath. ? Discomfort in your back, arms, or stomach.  You have: ? Trouble speaking. ? Weakness on one side of your body. ? Drooping in your face. These symptoms may represent a serious problem that is an emergency. Do not wait to see if the symptoms will go away. Get medical help right away. Call your local emergency services (911 in the [place] Do not drive yourself to the hospital. Summary  Sleep apnea is a condition in which breathing pauses or becomes shallow during sleep.  The most common cause is a collapsed or blocked airway.  The goal of treatment is to restore normal breathing and to ease symptoms during sleep. This information is not intended to replace advice given to you by your health care provider. Make sure you discuss  any questions you have with your health care provider. Document Released: 01/23/2002 Document Revised: 11/19/2017 Document Reviewed: 09/28/2017 Elsevier Patient Education  2020 [employer].

## 2018-11-10 ENCOUNTER — Other Ambulatory Visit: Payer: Self-pay | Admitting: Family

## 2018-11-10 DIAGNOSIS — E1165 Type 2 diabetes mellitus with hyperglycemia: Secondary | ICD-10-CM

## 2018-11-10 DIAGNOSIS — Z794 Long term (current) use of insulin: Secondary | ICD-10-CM

## 2018-11-14 DIAGNOSIS — Z4789 Encounter for other orthopedic aftercare: Secondary | ICD-10-CM | POA: Diagnosis not present

## 2018-11-14 DIAGNOSIS — M25512 Pain in left shoulder: Secondary | ICD-10-CM | POA: Diagnosis not present

## 2018-11-15 ENCOUNTER — Other Ambulatory Visit: Payer: Self-pay | Admitting: Family

## 2018-11-15 DIAGNOSIS — Z794 Long term (current) use of insulin: Secondary | ICD-10-CM

## 2018-11-15 DIAGNOSIS — E1165 Type 2 diabetes mellitus with hyperglycemia: Secondary | ICD-10-CM

## 2018-11-21 DIAGNOSIS — M25512 Pain in left shoulder: Secondary | ICD-10-CM | POA: Diagnosis not present

## 2018-11-22 ENCOUNTER — Telehealth: Payer: Self-pay | Admitting: Pulmonary Disease

## 2018-11-22 NOTE — Telephone Encounter (Signed)
I have resc the patient till 11/23/18 pt aware

## 2018-11-23 ENCOUNTER — Ambulatory Visit: Payer: BC Managed Care – PPO

## 2018-11-23 ENCOUNTER — Other Ambulatory Visit: Payer: Self-pay

## 2018-11-23 DIAGNOSIS — G4733 Obstructive sleep apnea (adult) (pediatric): Secondary | ICD-10-CM | POA: Diagnosis not present

## 2018-11-23 DIAGNOSIS — G4730 Sleep apnea, unspecified: Secondary | ICD-10-CM

## 2018-11-29 DIAGNOSIS — M25512 Pain in left shoulder: Secondary | ICD-10-CM | POA: Diagnosis not present

## 2018-12-02 DIAGNOSIS — M25512 Pain in left shoulder: Secondary | ICD-10-CM | POA: Diagnosis not present

## 2018-12-05 DIAGNOSIS — G4733 Obstructive sleep apnea (adult) (pediatric): Secondary | ICD-10-CM

## 2018-12-06 DIAGNOSIS — M25512 Pain in left shoulder: Secondary | ICD-10-CM | POA: Diagnosis not present

## 2018-12-07 ENCOUNTER — Telehealth: Payer: Self-pay | Admitting: Pulmonary Disease

## 2018-12-07 DIAGNOSIS — G4730 Sleep apnea, unspecified: Secondary | ICD-10-CM

## 2018-12-07 DIAGNOSIS — G4733 Obstructive sleep apnea (adult) (pediatric): Secondary | ICD-10-CM

## 2018-12-07 NOTE — Telephone Encounter (Signed)
Called and spoke with Patient.  Results and recommendations given.  Understanding stated.  DME order placed.  Patient scheduled follow up with Aaron Edelman, NP, 02/27/2019.

## 2018-12-08 DIAGNOSIS — M25512 Pain in left shoulder: Secondary | ICD-10-CM | POA: Diagnosis not present

## 2018-12-13 DIAGNOSIS — M25512 Pain in left shoulder: Secondary | ICD-10-CM | POA: Diagnosis not present

## 2018-12-15 DIAGNOSIS — M25512 Pain in left shoulder: Secondary | ICD-10-CM | POA: Diagnosis not present

## 2018-12-26 ENCOUNTER — Other Ambulatory Visit: Payer: Self-pay | Admitting: Family

## 2018-12-26 DIAGNOSIS — Z4789 Encounter for other orthopedic aftercare: Secondary | ICD-10-CM | POA: Diagnosis not present

## 2018-12-26 DIAGNOSIS — E1165 Type 2 diabetes mellitus with hyperglycemia: Secondary | ICD-10-CM

## 2018-12-26 DIAGNOSIS — Z794 Long term (current) use of insulin: Secondary | ICD-10-CM

## 2018-12-27 DIAGNOSIS — G4733 Obstructive sleep apnea (adult) (pediatric): Secondary | ICD-10-CM | POA: Diagnosis not present

## 2019-01-03 DIAGNOSIS — M25512 Pain in left shoulder: Secondary | ICD-10-CM | POA: Diagnosis not present

## 2019-01-10 DIAGNOSIS — M25512 Pain in left shoulder: Secondary | ICD-10-CM | POA: Diagnosis not present

## 2019-01-18 DIAGNOSIS — M25512 Pain in left shoulder: Secondary | ICD-10-CM | POA: Diagnosis not present

## 2019-01-20 DIAGNOSIS — M25512 Pain in left shoulder: Secondary | ICD-10-CM | POA: Diagnosis not present

## 2019-01-21 ENCOUNTER — Other Ambulatory Visit: Payer: Self-pay | Admitting: Family

## 2019-01-21 DIAGNOSIS — E785 Hyperlipidemia, unspecified: Secondary | ICD-10-CM

## 2019-01-21 LAB — HM DIABETES EYE EXAM

## 2019-01-24 DIAGNOSIS — M25512 Pain in left shoulder: Secondary | ICD-10-CM | POA: Diagnosis not present

## 2019-01-26 DIAGNOSIS — M25512 Pain in left shoulder: Secondary | ICD-10-CM | POA: Diagnosis not present

## 2019-01-26 DIAGNOSIS — G4733 Obstructive sleep apnea (adult) (pediatric): Secondary | ICD-10-CM | POA: Diagnosis not present

## 2019-01-31 DIAGNOSIS — M25512 Pain in left shoulder: Secondary | ICD-10-CM | POA: Diagnosis not present

## 2019-02-02 DIAGNOSIS — M25512 Pain in left shoulder: Secondary | ICD-10-CM | POA: Diagnosis not present

## 2019-02-03 ENCOUNTER — Other Ambulatory Visit: Payer: Self-pay | Admitting: Family

## 2019-02-03 DIAGNOSIS — E1165 Type 2 diabetes mellitus with hyperglycemia: Secondary | ICD-10-CM

## 2019-02-03 DIAGNOSIS — Z794 Long term (current) use of insulin: Secondary | ICD-10-CM

## 2019-02-15 DIAGNOSIS — R0981 Nasal congestion: Secondary | ICD-10-CM | POA: Diagnosis not present

## 2019-02-15 DIAGNOSIS — J00 Acute nasopharyngitis [common cold]: Secondary | ICD-10-CM | POA: Diagnosis not present

## 2019-02-15 DIAGNOSIS — R05 Cough: Secondary | ICD-10-CM | POA: Diagnosis not present

## 2019-02-26 DIAGNOSIS — G4733 Obstructive sleep apnea (adult) (pediatric): Secondary | ICD-10-CM | POA: Insufficient documentation

## 2019-02-26 NOTE — Progress Notes (Signed)
@Patient  ID: Gregory Carey, male    DOB: Dec 12, 1960, 59 y.o.   MRN: [id number]  Chief Complaint  Patient presents with  . Follow-up    For OSA. States he is still getting used to machine. Uses [employer] as his DME.     Referring provider: Sharion Balloon, FNP  HPI:  59 year old male never smoker followed in our office for obstructive sleep  PMH: Hypertension, hyperlipidemia, type 2 diabetes Smoker/ Smoking History: Never smoker Maintenance: None Pt of: Patient of Dr. Ander Slade  02/27/2019  - Visit   59 year old male never smoker followed in our office for moderate obstructive sleep apnea.  Patient completed a home sleep study in October.  Patient was started on CPAP therapy.  Patient reports this is been going well.  CPAP compliance report confirms this.  See CPAP compliance report listed below:  01/24/2019-02/22/2019-CPAP compliance report-30 out of last 30 days used, 29 of those days greater than 4 hours, average usage 7 hours and 50 minutes, APAP setting 5-20, AHI 0.8  Unfortunately the patient did test positive for Covid on 02/15/2019.  Patient reports that he has had slightly worsening cough.  He reports that he has been asymptomatic for the last 5 to 7 days.  He never needed to be hospitalized.  He never needed antibiotics or steroids.  Vital signs are stable today.  Tests:   11/23/2018-home sleep study-AHI 28.4, SaO2 low 84%  02/15/2019-SARS-CoV-2-positive  FENO:  No results found for: NITRICOXIDE  PFT: No flowsheet data found.  WALK:  No flowsheet data found.  Imaging: No results found.  Lab Results:  CBC    Component Value Date/Time   WBC 7.3 09/26/2018 0906   RBC 4.96 09/26/2018 0906   HGB 15.7 09/26/2018 0906   HGB 15.6 08/29/2018 0924   HCT 43.5 09/26/2018 0906   HCT 46.3 08/29/2018 0924   PLT 580 (H) 09/26/2018 0906   PLT 288 08/29/2018 0924   MCV 87.7 09/26/2018 0906   MCV 84 08/29/2018 0924   MCH 31.7 09/26/2018 0906   MCHC 36.1 (H) 09/26/2018  0906   RDW 12.7 09/26/2018 0906   RDW 12.7 08/29/2018 0924   LYMPHSABS 1.9 08/29/2018 0924   EOSABS 0.2 08/29/2018 0924   BASOSABS 0.1 08/29/2018 0924    BMET    Component Value Date/Time   NA 142 09/26/2018 0906   NA 143 08/29/2018 0924   K 4.8 09/26/2018 0906   CL 106 09/26/2018 0906   CO2 24 09/26/2018 0906   GLUCOSE 148 (H) 09/26/2018 0906   BUN 17 09/26/2018 0906   BUN 13 08/29/2018 0924   CREATININE 0.96 09/26/2018 0906   CALCIUM 9.1 09/26/2018 0906   GFRNONAA >60 09/26/2018 0906   GFRAA >60 09/26/2018 0906    BNP No results found for: BNP  ProBNP No results found for: PROBNP  Specialty Problems      Pulmonary Problems   OSA (obstructive sleep apnea)    11/23/2018-home sleep study-AHI 28.4, SaO2 low 84%          No Known Allergies  Immunization History  Administered Date(s) Administered  . Influenza-Unspecified 12/18/2014, 11/17/2015, 10/26/2017  . Pneumococcal Polysaccharide-23 08/29/2018  . Tdap 04/29/2015    Past Medical History:  Diagnosis Date  . Diabetes mellitus without complication ([place])   . Difficult intubation    Was infomed by MD Anesth his airway was small  . Hyperlipidemia   . Hypertension   . Localized osteoarthritis of left shoulder   . Osteoarthritis of  left shoulder 09/29/2018    Tobacco History: Social History   Tobacco Use  Smoking Status Never Smoker  Smokeless Tobacco Never Used   Counseling given: Yes  Continue to not smoke  Outpatient Encounter Medications as of 02/27/2019  Medication Sig  . amLODipine (NORVASC) 5 MG tablet Take 1 tablet (5 mg total) by mouth daily.  Marland Kitchen aspirin EC 81 MG tablet Take 1 tablet (81 mg total) by mouth daily.  . blood glucose meter kit and supplies Dispense based on patient and insurance preference. Use up to four times daily as directed. (FOR ICD-9 250.00, 250.01).  . CONTOUR NEXT TEST test strip USE AS DIRECTED TWICE DAILY  . lisinopril-hydrochlorothiazide (ZESTORETIC) 20-12.5 MG  tablet Take 2 tablets by mouth once daily  . metFORMIN (GLUCOPHAGE-XR) 750 MG 24 hr tablet Take 1 tablet by mouth once daily with breakfast  . oxyCODONE (ROXICODONE) 5 MG immediate release tablet Take 1 tablet (5 mg total) by mouth every 6 (six) hours as needed for severe pain.  . rosuvastatin (CRESTOR) 20 MG tablet Take 1 tablet by mouth once daily  . traMADol (ULTRAM) 50 MG tablet Take 50 mg by mouth every 6 (six) hours as needed (pain.).  Marland Kitchen TRULICITY 2.12 YQ/8.2NO SOPN INJECT 0.75MG SUBCUTANEOUSLY ONCE WEEKLY   No facility-administered encounter medications on file as of 02/27/2019.     Review of Systems  Review of Systems  Constitutional: Negative for activity change, chills, fatigue, fever and unexpected weight change.  HENT: Negative for postnasal drip, rhinorrhea, sinus pressure, sinus pain and sore throat.   Eyes: Negative.   Respiratory: Negative for cough, shortness of breath and wheezing.   Cardiovascular: Negative for chest pain and palpitations.  Gastrointestinal: Negative for constipation, diarrhea, nausea and vomiting.  Endocrine: Negative.   Genitourinary: Negative.   Musculoskeletal: Negative.   Skin: Negative.   Neurological: Negative for dizziness and headaches.  Psychiatric/Behavioral: Negative.  Negative for dysphoric mood. The patient is not nervous/anxious.   All other systems reviewed and are negative.    Physical Exam  BP 128/88 (BP Location: Left Arm, Patient Position: Sitting, Cuff Size: Normal)   Pulse 72   Temp (!) 97.3 F (36.3 C) (Temporal)   Ht 5' 11"  (1.803 m)   Wt 260 lb (117.9 kg)   SpO2 98% Comment: on RA  BMI 36.26 kg/m   Wt Readings from Last 5 Encounters:  02/27/19 260 lb (117.9 kg)  11/08/18 268 lb 3.2 oz (121.7 kg)  09/26/18 267 lb 14.4 oz (121.5 kg)  08/29/18 226 lb 14.4 oz (102.9 kg)  07/25/18 272 lb (123.4 kg)    BMI Readings from Last 5 Encounters:  02/27/19 36.26 kg/m  11/08/18 37.41 kg/m  09/26/18 37.36 kg/m    08/29/18 31.65 kg/m  07/25/18 37.94 kg/m     Physical Exam Vitals and nursing note reviewed.  Constitutional:      General: He is not in acute distress.    Appearance: Normal appearance. He is obese.  HENT:     Head: Normocephalic and atraumatic.     Right Ear: Hearing and external ear normal.     Left Ear: Hearing and external ear normal.     Nose: No mucosal edema or rhinorrhea.     Right Turbinates: Not enlarged.     Left Turbinates: Not enlarged.     Mouth/Throat:     Pharynx: Oropharynx is clear. No oropharyngeal exudate.  Eyes:     Pupils: Pupils are equal, round, and reactive to light.  Cardiovascular:     Rate and Rhythm: Normal rate and regular rhythm.     Pulses: Normal pulses.     Heart sounds: Normal heart sounds. No murmur.  Pulmonary:     Effort: Pulmonary effort is normal.     Breath sounds: Normal breath sounds. No decreased breath sounds, wheezing or rales.  Musculoskeletal:     Cervical back: Normal range of motion.     Right lower leg: No edema.     Left lower leg: No edema.  Lymphadenopathy:     Cervical: No cervical adenopathy.  Skin:    General: Skin is warm and dry.     Capillary Refill: Capillary refill takes less than 2 seconds.     Findings: No erythema or rash.  Neurological:     General: No focal deficit present.     Mental Status: He is alert and oriented to person, place, and time.     Motor: No weakness.     Coordination: Coordination normal.     Gait: Gait is intact. Gait normal.  Psychiatric:        Mood and Affect: Mood normal.        Behavior: Behavior normal. Behavior is cooperative.        Thought Content: Thought content normal.        Judgment: Judgment normal.       Assessment & Plan:   OSA (obstructive sleep apnea) Plan: Continue CPAP therapy Follow-up in 1 year Continue to work on increasing activity and reducing BMI  COVID-19 virus detected Plan: Continue to monitor respiratory symptoms If you have  worsened respiratory symptoms such as cough, congestion, fevers, shortness of breath, please contact our office  Morbid obesity ([place]) Plan: Continue to work on increasing mobility and activity     Return in about 1 year (around 02/27/2020), or if symptoms worsen or fail to improve, for Follow up with Dr. Ander Slade, Follow up with Wyn Quaker FNP-C.   Lauraine Rinne, NP 02/27/2019   This appointment required 32 minutes of patient care (this includes precharting, chart review, review of results, face-to-face care, etc.).

## 2019-02-27 ENCOUNTER — Encounter: Payer: Self-pay | Admitting: Pulmonary Disease

## 2019-02-27 ENCOUNTER — Ambulatory Visit (INDEPENDENT_AMBULATORY_CARE_PROVIDER_SITE_OTHER): Payer: BC Managed Care – PPO | Admitting: Pulmonary Disease

## 2019-02-27 ENCOUNTER — Other Ambulatory Visit: Payer: Self-pay

## 2019-02-27 DIAGNOSIS — G4733 Obstructive sleep apnea (adult) (pediatric): Secondary | ICD-10-CM

## 2019-02-27 DIAGNOSIS — U071 COVID-19: Secondary | ICD-10-CM

## 2019-02-27 NOTE — Assessment & Plan Note (Signed)
Plan: Continue CPAP therapy Follow-up in 1 year Continue to work on increasing activity and reducing BMI

## 2019-02-27 NOTE — Patient Instructions (Signed)
You were seen today by Lauraine Rinne, NP  for:   1. Obesity ([place]) 2. OSA (obstructive sleep apnea)  We recommend that you continue using your CPAP daily >>>Keep up the hard work using your device >>> Goal should be wearing this for the entire night that you are sleeping, at least 4 to 6 hours  Remember:  . Do not drive or operate heavy machinery if tired or drowsy.  . Please notify the supply company and office if you are unable to use your device regularly due to missing supplies or machine being broken.  . Work on maintaining a healthy weight and following your recommended nutrition plan  . Maintain proper daily exercise and movement  . Maintaining proper use of your device can also help improve management of other chronic illnesses such as: Blood pressure, blood sugars, and weight management.   BiPAP/ CPAP Cleaning:  >>>Clean weekly, with Dawn soap, and bottle brush.  Set up to air dry. >>> Wipe mask out daily with wet wipe or towelette   3. COVID-19 virus detected  Continue to monitor symptoms     Follow Up:    Return in about 1 year (around 02/27/2020), or if symptoms worsen or fail to improve, for Follow up with Dr. Ander Slade, Follow up with Wyn Quaker FNP-C.   Please do your part to reduce the spread of COVID-19:      Reduce your risk of any infection  and COVID19 by using the similar precautions used for avoiding the common cold or flu:  Marland Kitchen Wash your hands often with soap and warm water for at least 20 seconds.  If soap and water are not readily available, use an alcohol-based hand sanitizer with at least 60% alcohol.  . If coughing or sneezing, cover your mouth and nose by coughing or sneezing into the elbow areas of your shirt or coat, into a tissue or into your sleeve (not your hands). Langley Gauss A MASK when in public  . Avoid shaking hands with others and consider head nods or verbal greetings only. . Avoid touching your eyes, nose, or mouth with unwashed hands.   . Avoid close contact with people who are sick. . Avoid places or events with large numbers of people in one location, like concerts or sporting events. . If you have some symptoms but not all symptoms, continue to monitor at home and seek medical attention if your symptoms worsen. . If you are having a medical emergency, call 911.   [place] / e-Visit: [email or web address]         [facility]: [facility]: [id number]                   [facility]: [id number]     It is flu season:   >>> Best ways to protect herself from the flu: Receive the yearly flu vaccine, practice good hand hygiene washing with soap and also using hand sanitizer when available, eat a nutritious meals, get adequate rest, hydrate appropriately   Please contact the office if your symptoms worsen or you have concerns that you are not improving.   Thank you for choosing [facility] for your healthcare, and for allowing [name] to partner with you on your healthcare journey. I am thankful to be able to provide care to you today.   Wyn Quaker FNP-C   Sleep Apnea Sleep apnea affects breathing during  sleep. It causes breathing to stop for a short time or to become shallow. It can also increase the risk of:  Heart attack.  Stroke.  Being very overweight (obese).  Diabetes.  Heart failure.  Irregular heartbeat. The goal of treatment is to help you breathe normally again. What are the causes? There are three kinds of sleep apnea:  Obstructive sleep apnea. This is caused by a blocked or collapsed airway.  Central sleep apnea. This happens when the brain does not send the right signals to the muscles that control breathing.  Mixed sleep apnea. This is a combination of obstructive and central sleep apnea. The most common cause of this condition is  a collapsed or blocked airway. This can happen if:  Your throat muscles are too relaxed.  Your tongue and tonsils are too large.  You are overweight.  Your airway is too small. What increases the risk?  Being overweight.  Smoking.  Having a small airway.  Being older.  Being male.  Drinking alcohol.  Taking medicines to calm yourself (sedatives or tranquilizers).  Having family members with the condition. What are the signs or symptoms?  Trouble staying asleep.  Being sleepy or tired during the day.  Getting angry a lot.  Loud snoring.  Headaches in the morning.  Not being able to focus your mind (concentrate).  Forgetting things.  Less interest in sex.  Mood swings.  Personality changes.  Feelings of sadness (depression).  Waking up a lot during the night to pee (urinate).  Dry mouth.  Sore throat. How is this diagnosed?  Your medical history.  A physical exam.  A test that is done when you are sleeping (sleep study). The test is most often done in a sleep lab but may also be done at home. How is this treated?   Sleeping on your side.  Using a medicine to get rid of mucus in your nose (decongestant).  Avoiding the use of alcohol, medicines to help you relax, or certain pain medicines (narcotics).  Losing weight, if needed.  Changing your diet.  Not smoking.  Using a machine to open your airway while you sleep, such as: ? An oral appliance. This is a mouthpiece that shifts your lower jaw forward. ? A CPAP device. This device blows air through a mask when you breathe out (exhale). ? An EPAP device. This has valves that you put in each nostril. ? A BPAP device. This device blows air through a mask when you breathe in (inhale) and breathe out.  Having surgery if other treatments do not work. It is important to get treatment for sleep apnea. Without treatment, it can lead to:  High blood pressure.  Coronary artery disease.  In men,  not being able to have an erection (impotence).  Reduced thinking ability. Follow these instructions at home: Lifestyle  Make changes that your doctor recommends.  Eat a healthy diet.  Lose weight if needed.  Avoid alcohol, medicines to help you relax, and some pain medicines.  Do not use any products that contain nicotine or tobacco, such as cigarettes, e-cigarettes, and chewing tobacco. If you need help quitting, ask your doctor. General instructions  Take over-the-counter and prescription medicines only as told by your doctor.  If you were given a machine to use while you sleep, use it only as told by your doctor.  If you are having surgery, make sure to tell your doctor you have sleep apnea. You may need to bring your device  with you.  Keep all follow-up visits as told by your doctor. This is important. Contact a doctor if:  The machine that you were given to use during sleep bothers you or does not seem to be working.  You do not get better.  You get worse. Get help right away if:  Your chest hurts.  You have trouble breathing in enough air.  You have an uncomfortable feeling in your back, arms, or stomach.  You have trouble talking.  One side of your body feels weak.  A part of your face is hanging down. These symptoms may be an emergency. Do not wait to see if the symptoms will go away. Get medical help right away. Call your local emergency services (911 in the [place] Do not drive yourself to the hospital. Summary  This condition affects breathing during sleep.  The most common cause is a collapsed or blocked airway.  The goal of treatment is to help you breathe normally while you sleep. This information is not intended to replace advice given to you by your health care provider. Make sure you discuss any questions you have with your health care provider. Document Revised: 11/19/2017 Document Reviewed: 09/28/2017 Elsevier Patient Education  [place].

## 2019-02-27 NOTE — Assessment & Plan Note (Signed)
Plan: Continue to work on increasing mobility and activity

## 2019-02-27 NOTE — Assessment & Plan Note (Signed)
Plan: Continue to monitor respiratory symptoms If you have worsened respiratory symptoms such as cough, congestion, fevers, shortness of breath, please contact our office

## 2019-03-08 DIAGNOSIS — Z96612 Presence of left artificial shoulder joint: Secondary | ICD-10-CM | POA: Diagnosis not present

## 2019-03-15 ENCOUNTER — Other Ambulatory Visit: Payer: Self-pay

## 2019-03-17 ENCOUNTER — Encounter: Payer: Self-pay | Admitting: Family

## 2019-03-17 ENCOUNTER — Ambulatory Visit (INDEPENDENT_AMBULATORY_CARE_PROVIDER_SITE_OTHER): Payer: BC Managed Care – PPO | Admitting: Family

## 2019-03-17 ENCOUNTER — Other Ambulatory Visit: Payer: Self-pay

## 2019-03-17 VITALS — BP 133/76 | HR 76 | Temp 97.7°F | Ht 71.0 in | Wt 264.4 lb

## 2019-03-17 DIAGNOSIS — E785 Hyperlipidemia, unspecified: Secondary | ICD-10-CM | POA: Diagnosis not present

## 2019-03-17 DIAGNOSIS — I10 Essential (primary) hypertension: Secondary | ICD-10-CM

## 2019-03-17 DIAGNOSIS — Z794 Long term (current) use of insulin: Secondary | ICD-10-CM

## 2019-03-17 DIAGNOSIS — E1165 Type 2 diabetes mellitus with hyperglycemia: Secondary | ICD-10-CM

## 2019-03-17 DIAGNOSIS — Z1211 Encounter for screening for malignant neoplasm of colon: Secondary | ICD-10-CM

## 2019-03-17 DIAGNOSIS — G4733 Obstructive sleep apnea (adult) (pediatric): Secondary | ICD-10-CM | POA: Diagnosis not present

## 2019-03-17 DIAGNOSIS — Z1212 Encounter for screening for malignant neoplasm of rectum: Secondary | ICD-10-CM

## 2019-03-17 LAB — BAYER DCA HB A1C WAIVED: HB A1C (BAYER DCA - WAIVED): 6 % (ref <7.0)

## 2019-03-17 MED ORDER — AMLODIPINE BESYLATE 5 MG PO TABS: 5.0000 mg | ORAL_TABLET | Freq: Every day | ORAL | 3 refills | Status: DC

## 2019-03-17 MED ORDER — ROSUVASTATIN CALCIUM 20 MG PO TABS: 20.0000 mg | ORAL_TABLET | Freq: Every day | ORAL | 0 refills | Status: DC

## 2019-03-17 MED ORDER — LISINOPRIL-HYDROCHLOROTHIAZIDE 20-12.5 MG PO TABS: 2.0000 | ORAL_TABLET | Freq: Every day | ORAL | 1 refills | Status: DC

## 2019-03-17 MED ORDER — TRULICITY 0.75 MG/0.5ML ~~LOC~~ SOAJ: SUBCUTANEOUS | 6 refills | Status: DC

## 2019-03-17 MED ORDER — METFORMIN HCL ER 750 MG PO TB24: 750.0000 mg | ORAL_TABLET | Freq: Every day | ORAL | 0 refills | Status: DC

## 2019-03-17 NOTE — Patient Instructions (Signed)

## 2019-03-17 NOTE — Progress Notes (Signed)
Subjective:    Patient ID: Gregory Carey, male    DOB: 28-Aug-1960, 59 y.o.   MRN: [id number]  Chief Complaint  Patient presents with  . Medical Management of Chronic Issues    Insurance might not cover trulicity.   PT presents to the office today for chronic follow up.  Diabetes He presents for his follow-up diabetic visit. He has type 2 diabetes mellitus. His disease course has been stable. There are no hypoglycemic associated symptoms. Pertinent negatives for diabetes include no blurred vision and no foot paresthesias. There are no hypoglycemic complications. Symptoms are stable. Pertinent negatives for diabetic complications include no CVA, heart disease, nephropathy or peripheral neuropathy. Risk factors for coronary artery disease include diabetes mellitus, dyslipidemia, male sex, hypertension and sedentary lifestyle. His weight is stable. He is following a generally unhealthy diet. His overall blood glucose range is 130-140 mg/dl. An ACE inhibitor/angiotensin II receptor blocker is being taken. Eye exam is current.  Hypertension This is a chronic problem. The current episode started more than 1 year ago. The problem has been resolved since onset. The problem is controlled. Associated symptoms include malaise/fatigue. Pertinent negatives include no blurred vision, peripheral edema or shortness of breath. Risk factors for coronary artery disease include dyslipidemia, diabetes mellitus, obesity, male gender and sedentary lifestyle. The current treatment provides moderate improvement. There is no history of CVA.  Hyperlipidemia This is a chronic problem. The current episode started more than 1 year ago. The problem is controlled. Recent lipid tests were reviewed and are normal. Exacerbating diseases include hypothyroidism. Pertinent negatives include no shortness of breath. Current antihyperlipidemic treatment includes statins. The current treatment provides mild improvement of lipids.   OSA    Uses CPAP nightly. Stable.   Review of Systems  Constitutional: Positive for malaise/fatigue.  Eyes: Negative for blurred vision.  Respiratory: Negative for shortness of breath.   All other systems reviewed and are negative.      Objective:   Physical Exam Vitals reviewed.  Constitutional:      General: He is not in acute distress.    Appearance: He is well-developed. He is obese.  HENT:     Head: Normocephalic.     Right Ear: Tympanic membrane normal.     Left Ear: Tympanic membrane normal.  Eyes:     General:        Right eye: No discharge.        Left eye: No discharge.     Pupils: Pupils are equal, round, and reactive to light.  Neck:     Thyroid: No thyromegaly.  Cardiovascular:     Rate and Rhythm: Normal rate and regular rhythm.     Heart sounds: Normal heart sounds. No murmur.  Pulmonary:     Effort: Pulmonary effort is normal. No respiratory distress.     Breath sounds: Normal breath sounds. No wheezing.  Abdominal:     General: Bowel sounds are normal. There is no distension.     Palpations: Abdomen is soft.     Tenderness: There is no abdominal tenderness.  Musculoskeletal:        General: No tenderness. Normal range of motion.     Cervical back: Normal range of motion and neck supple.  Skin:    General: Skin is warm and dry.     Findings: No erythema or rash.  Neurological:     Mental Status: He is alert and oriented to person, place, and time.     Cranial Nerves: No  cranial nerve deficit.     Deep Tendon Reflexes: Reflexes are normal and symmetric.  Psychiatric:        Behavior: Behavior normal.        Thought Content: Thought content normal.        Judgment: Judgment normal.     Diabetic Foot Exam - Simple   Simple Foot Form Diabetic Foot exam was performed with the following findings: Yes 03/17/2019 11:13 AM  Visual Inspection No deformities, no ulcerations, no other skin breakdown bilaterally: Yes Sensation Testing Intact to touch and  monofilament testing bilaterally: Yes Pulse Check Posterior Tibialis and Dorsalis pulse intact bilaterally: Yes Comments Callus formation on ball of bilateral feet, worse on left       BP (!) 155/88   Pulse 74   Temp 97.7 F (36.5 C) (Temporal)   Ht 5' 11"  (1.803 m)   Wt 264 lb 6.4 oz (119.9 kg)   SpO2 97%   BMI 36.88 kg/m      Assessment & Plan:  Gregory Carey comes in today with chief complaint of Medical Management of Chronic Issues (Insurance might not cover trulicity.)   Diagnosis and orders addressed:  1. Type 2 diabetes mellitus with hyperglycemia, with long-term current use of insulin (HCC) - Bayer DCA Hb A1c Waived - metFORMIN (GLUCOPHAGE-XR) 750 MG 24 hr tablet; Take 1 tablet (750 mg total) by mouth daily with breakfast.  Dispense: 90 tablet; Refill: 0 - Dulaglutide (TRULICITY) 0.35 WS/5.6CL SOPN; INJECT 0.75MG SUBCUTANEOUSLY ONCE WEEKLY  Dispense: 0.15 mL; Refill: 6 - CBC with Differential/Platelet - CMP14+EGFR - Microalbumin / creatinine urine ratio  2. Essential hypertension - amLODipine (NORVASC) 5 MG tablet; Take 1 tablet (5 mg total) by mouth daily.  Dispense: 90 tablet; Refill: 3 - lisinopril-hydrochlorothiazide (ZESTORETIC) 20-12.5 MG tablet; Take 2 tablets by mouth daily.  Dispense: 180 tablet; Refill: 1 - CBC with Differential/Platelet - CMP14+EGFR  3. Hyperlipidemia, unspecified hyperlipidemia type - rosuvastatin (CRESTOR) 20 MG tablet; Take 1 tablet (20 mg total) by mouth daily.  Dispense: 90 tablet; Refill: 0 - CBC with Differential/Platelet - CMP14+EGFR  4. OSA (obstructive sleep apnea) - CBC with Differential/Platelet - CMP14+EGFR  5. Morbid obesity ([place]) - CBC with Differential/Platelet - CMP14+EGFR  6. Colon cancer screening - Cologuard - CBC with Differential/Platelet - CMP14+EGFR  7. Screening for malignant neoplasm of the rectum - Cologuard - CBC with Differential/Platelet - CMP14+EGFR   Labs pending Health Maintenance  reviewed Diet and exercise encouraged  Follow up plan: 6 months    Evelina Dun, FNP

## 2019-03-18 LAB — CBC WITH DIFFERENTIAL/PLATELET
Basophils Absolute: 0.1 10*3/uL (ref 0.0–0.2)
Basos: 1 %
EOS (ABSOLUTE): 0.3 10*3/uL (ref 0.0–0.4)
Eos: 4 %
Hematocrit: 45.5 % (ref 37.5–51.0)
Hemoglobin: 15.2 g/dL (ref 13.0–17.7)
Immature Grans (Abs): 0 10*3/uL (ref 0.0–0.1)
Immature Granulocytes: 0 %
Lymphocytes Absolute: 2.8 10*3/uL (ref 0.7–3.1)
Lymphs: 35 %
MCH: 28.9 pg (ref 26.6–33.0)
MCHC: 33.4 g/dL (ref 31.5–35.7)
MCV: 87 fL (ref 79–97)
Monocytes Absolute: 0.7 10*3/uL (ref 0.1–0.9)
Monocytes: 9 %
Neutrophils Absolute: 4 10*3/uL (ref 1.4–7.0)
Neutrophils: 51 %
Platelets: 286 10*3/uL (ref 150–450)
RBC: 5.26 x10E6/uL (ref 4.14–5.80)
RDW: 13.2 % (ref 11.6–15.4)
WBC: 7.9 10*3/uL (ref 3.4–10.8)

## 2019-03-18 LAB — CMP14+EGFR
ALT: 31 IU/L (ref 0–44)
AST: 21 IU/L (ref 0–40)
Albumin/Globulin Ratio: 2.1 (ref 1.2–2.2)
Albumin: 4.5 g/dL (ref 3.8–4.9)
Alkaline Phosphatase: 57 IU/L (ref 39–117)
BUN/Creatinine Ratio: 12 (ref 9–20)
BUN: 12 mg/dL (ref 6–24)
Bilirubin Total: 0.6 mg/dL (ref 0.0–1.2)
CO2: 27 mmol/L (ref 20–29)
Calcium: 9.5 mg/dL (ref 8.7–10.2)
Chloride: 103 mmol/L (ref 96–106)
Creatinine, Ser: 0.99 mg/dL (ref 0.76–1.27)
GFR calc Af Amer: 97 mL/min/{1.73_m2} (ref >59)
GFR calc non Af Amer: 84 mL/min/{1.73_m2} (ref >59)
Globulin, Total: 2.1 g/dL (ref 1.5–4.5)
Glucose: 93 mg/dL (ref 65–99)
Potassium: 3.9 mmol/L (ref 3.5–5.2)
Sodium: 145 mmol/L — ABNORMAL HIGH (ref 134–144)
Total Protein: 6.6 g/dL (ref 6.0–8.5)

## 2019-03-18 LAB — MICROALBUMIN / CREATININE URINE RATIO
Creatinine, Urine: 63.9 mg/dL
Microalb/Creat Ratio: 15 mg/g creat (ref 0–29)
Microalbumin, Urine: 9.6 ug/mL

## 2019-03-28 ENCOUNTER — Ambulatory Visit: Payer: BC Managed Care – PPO | Admitting: Family

## 2019-03-29 DIAGNOSIS — G4733 Obstructive sleep apnea (adult) (pediatric): Secondary | ICD-10-CM | POA: Diagnosis not present

## 2019-04-18 ENCOUNTER — Other Ambulatory Visit: Payer: Self-pay | Admitting: Family

## 2019-04-18 DIAGNOSIS — E785 Hyperlipidemia, unspecified: Secondary | ICD-10-CM

## 2019-04-18 DIAGNOSIS — I10 Essential (primary) hypertension: Secondary | ICD-10-CM

## 2019-05-22 DIAGNOSIS — Z23 Encounter for immunization: Secondary | ICD-10-CM | POA: Diagnosis not present

## 2019-06-19 DIAGNOSIS — Z23 Encounter for immunization: Secondary | ICD-10-CM | POA: Diagnosis not present

## 2019-07-24 ENCOUNTER — Other Ambulatory Visit: Payer: Self-pay | Admitting: Family

## 2019-07-24 DIAGNOSIS — E785 Hyperlipidemia, unspecified: Secondary | ICD-10-CM

## 2019-07-24 DIAGNOSIS — I10 Essential (primary) hypertension: Secondary | ICD-10-CM

## 2019-08-09 ENCOUNTER — Other Ambulatory Visit: Payer: Self-pay | Admitting: Family

## 2019-08-09 DIAGNOSIS — Z794 Long term (current) use of insulin: Secondary | ICD-10-CM

## 2019-08-09 DIAGNOSIS — E1165 Type 2 diabetes mellitus with hyperglycemia: Secondary | ICD-10-CM

## 2019-08-10 DIAGNOSIS — M170 Bilateral primary osteoarthritis of knee: Secondary | ICD-10-CM | POA: Diagnosis not present

## 2019-08-10 DIAGNOSIS — M25562 Pain in left knee: Secondary | ICD-10-CM | POA: Diagnosis not present

## 2019-08-10 DIAGNOSIS — M25561 Pain in right knee: Secondary | ICD-10-CM | POA: Diagnosis not present

## 2019-08-29 ENCOUNTER — Other Ambulatory Visit: Payer: Self-pay

## 2019-08-29 ENCOUNTER — Encounter: Payer: Self-pay | Admitting: Family

## 2019-08-29 ENCOUNTER — Ambulatory Visit (INDEPENDENT_AMBULATORY_CARE_PROVIDER_SITE_OTHER): Payer: BC Managed Care – PPO | Admitting: Family

## 2019-08-29 VITALS — BP 145/77 | HR 73 | Temp 98.0°F | Ht 71.0 in | Wt 273.0 lb

## 2019-08-29 DIAGNOSIS — M1710 Unilateral primary osteoarthritis, unspecified knee: Secondary | ICD-10-CM | POA: Insufficient documentation

## 2019-08-29 DIAGNOSIS — E785 Hyperlipidemia, unspecified: Secondary | ICD-10-CM

## 2019-08-29 DIAGNOSIS — G4733 Obstructive sleep apnea (adult) (pediatric): Secondary | ICD-10-CM

## 2019-08-29 DIAGNOSIS — I10 Essential (primary) hypertension: Secondary | ICD-10-CM | POA: Diagnosis not present

## 2019-08-29 DIAGNOSIS — M1711 Unilateral primary osteoarthritis, right knee: Secondary | ICD-10-CM

## 2019-08-29 DIAGNOSIS — E1165 Type 2 diabetes mellitus with hyperglycemia: Secondary | ICD-10-CM

## 2019-08-29 DIAGNOSIS — M179 Osteoarthritis of knee, unspecified: Secondary | ICD-10-CM | POA: Insufficient documentation

## 2019-08-29 DIAGNOSIS — Z794 Long term (current) use of insulin: Secondary | ICD-10-CM

## 2019-08-29 LAB — CBC WITH DIFFERENTIAL/PLATELET
Basophils Absolute: 0.1 10*3/uL (ref 0.0–0.2)
Basos: 1 %
EOS (ABSOLUTE): 0.3 10*3/uL (ref 0.0–0.4)
Eos: 4 %
Hematocrit: 46.1 % (ref 37.5–51.0)
Hemoglobin: 15.1 g/dL (ref 13.0–17.7)
Immature Grans (Abs): 0 10*3/uL (ref 0.0–0.1)
Immature Granulocytes: 0 %
Lymphocytes Absolute: 2.2 10*3/uL (ref 0.7–3.1)
Lymphs: 33 %
MCH: 28.3 pg (ref 26.6–33.0)
MCHC: 32.8 g/dL (ref 31.5–35.7)
MCV: 87 fL (ref 79–97)
Monocytes Absolute: 0.5 10*3/uL (ref 0.1–0.9)
Monocytes: 8 %
Neutrophils Absolute: 3.6 10*3/uL (ref 1.4–7.0)
Neutrophils: 54 %
Platelets: 287 10*3/uL (ref 150–450)
RBC: 5.33 x10E6/uL (ref 4.14–5.80)
RDW: 13.3 % (ref 11.6–15.4)
WBC: 6.8 10*3/uL (ref 3.4–10.8)

## 2019-08-29 LAB — LIPID PANEL
Chol/HDL Ratio: 3.7 ratio (ref 0.0–5.0)
Cholesterol, Total: 115 mg/dL (ref 100–199)
HDL: 31 mg/dL — ABNORMAL LOW (ref >39)
LDL Chol Calc (NIH): 56 mg/dL (ref 0–99)
Triglycerides: 163 mg/dL — ABNORMAL HIGH (ref 0–149)
VLDL Cholesterol Cal: 28 mg/dL (ref 5–40)

## 2019-08-29 LAB — CMP14+EGFR
ALT: 38 IU/L (ref 0–44)
AST: 28 IU/L (ref 0–40)
Albumin/Globulin Ratio: 2.1 (ref 1.2–2.2)
Albumin: 4.7 g/dL (ref 3.8–4.9)
Alkaline Phosphatase: 62 IU/L (ref 48–121)
BUN/Creatinine Ratio: 20 (ref 9–20)
BUN: 19 mg/dL (ref 6–24)
Bilirubin Total: 0.6 mg/dL (ref 0.0–1.2)
CO2: 25 mmol/L (ref 20–29)
Calcium: 9.7 mg/dL (ref 8.7–10.2)
Chloride: 103 mmol/L (ref 96–106)
Creatinine, Ser: 0.94 mg/dL (ref 0.76–1.27)
GFR calc Af Amer: 104 mL/min/{1.73_m2} (ref >59)
GFR calc non Af Amer: 90 mL/min/{1.73_m2} (ref >59)
Globulin, Total: 2.2 g/dL (ref 1.5–4.5)
Glucose: 106 mg/dL — ABNORMAL HIGH (ref 65–99)
Potassium: 4.2 mmol/L (ref 3.5–5.2)
Sodium: 142 mmol/L (ref 134–144)
Total Protein: 6.9 g/dL (ref 6.0–8.5)

## 2019-08-29 LAB — BAYER DCA HB A1C WAIVED: HB A1C (BAYER DCA - WAIVED): 6.1 % (ref <7.0)

## 2019-08-29 MED ORDER — LISINOPRIL-HYDROCHLOROTHIAZIDE 20-12.5 MG PO TABS: 2.0000 | ORAL_TABLET | Freq: Every day | ORAL | 0 refills | Status: DC

## 2019-08-29 MED ORDER — ROSUVASTATIN CALCIUM 20 MG PO TABS: 20.0000 mg | ORAL_TABLET | Freq: Every day | ORAL | 0 refills | Status: DC

## 2019-08-29 MED ORDER — METFORMIN HCL ER 750 MG PO TB24: 750.0000 mg | ORAL_TABLET | Freq: Every day | ORAL | 0 refills | Status: DC

## 2019-08-29 MED ORDER — TRULICITY 0.75 MG/0.5ML ~~LOC~~ SOAJ: SUBCUTANEOUS | 6 refills | Status: DC

## 2019-08-29 MED ORDER — AMLODIPINE BESYLATE 5 MG PO TABS: 5.0000 mg | ORAL_TABLET | Freq: Every day | ORAL | 3 refills | Status: DC

## 2019-08-29 NOTE — Patient Instructions (Signed)

## 2019-08-29 NOTE — Progress Notes (Signed)
Subjective:    Patient ID: Gregory Carey, male    DOB: 1960/09/09, 59 y.o.   MRN: [id number]  Chief Complaint  Patient presents with  . Medical Management of Chronic Issues  . Diabetes   PT presents to the office today for chronic follow up. He is followed by Ortho and is planning right knee surgery in the future.  Diabetes He presents for his follow-up diabetic visit. He has type 2 diabetes mellitus. His disease course has been stable. There are no hypoglycemic associated symptoms. There are no diabetic associated symptoms. Pertinent negatives for diabetes include no blurred vision and no foot paresthesias. Symptoms are stable. Pertinent negatives for diabetic complications include no CVA, heart disease, nephropathy or peripheral neuropathy. Risk factors for coronary artery disease include dyslipidemia, diabetes mellitus, hypertension, male sex and sedentary lifestyle. He is following a generally unhealthy diet. His overall blood glucose range is 130-140 mg/dl. An ACE inhibitor/angiotensin II receptor blocker is being taken. Eye exam is current.  Hypertension This is a chronic problem. The current episode started more than 1 year ago. The problem has been waxing and waning since onset. The problem is uncontrolled. Pertinent negatives include no blurred vision, malaise/fatigue, peripheral edema or shortness of breath. Risk factors for coronary artery disease include dyslipidemia, diabetes mellitus, obesity, male gender and sedentary lifestyle. The current treatment provides moderate improvement. There is no history of kidney disease or CVA.  Arthritis Presents for follow-up visit. He complains of pain and stiffness. The symptoms have been stable. Affected locations include the right knee and left knee. His pain is at a severity of 8/10.  Hyperlipidemia This is a chronic problem. The current episode started more than 1 year ago. The problem is controlled. Recent lipid tests were reviewed and are  normal. Exacerbating diseases include obesity. Pertinent negatives include no shortness of breath. Current antihyperlipidemic treatment includes statins. The current treatment provides moderate improvement of lipids. Risk factors for coronary artery disease include dyslipidemia, diabetes mellitus, hypertension, male sex and a sedentary lifestyle.   OSA Uses CPAP nightly, stable.    Review of Systems  Constitutional: Negative for malaise/fatigue.  Eyes: Negative for blurred vision.  Respiratory: Negative for shortness of breath.   Musculoskeletal: Positive for arthritis and stiffness.  All other systems reviewed and are negative.      Objective:   Physical Exam Vitals reviewed.  Constitutional:      General: He is not in acute distress.    Appearance: He is well-developed. He is obese.  HENT:     Head: Normocephalic.     Right Ear: Tympanic membrane normal.     Left Ear: Tympanic membrane normal.  Eyes:     General:        Right eye: No discharge.        Left eye: No discharge.     Pupils: Pupils are equal, round, and reactive to light.  Neck:     Thyroid: No thyromegaly.  Cardiovascular:     Rate and Rhythm: Normal rate and regular rhythm.     Heart sounds: Normal heart sounds. No murmur heard.   Pulmonary:     Effort: Pulmonary effort is normal. No respiratory distress.     Breath sounds: Normal breath sounds. No wheezing.  Abdominal:     General: Bowel sounds are normal. There is no distension.     Palpations: Abdomen is soft.     Tenderness: There is no abdominal tenderness.  Musculoskeletal:  General: No tenderness. Normal range of motion.     Cervical back: Normal range of motion and neck supple.  Skin:    General: Skin is warm and dry.     Findings: No erythema or rash.  Neurological:     Mental Status: He is alert and oriented to person, place, and time.     Cranial Nerves: No cranial nerve deficit.     Deep Tendon Reflexes: Reflexes are normal and  symmetric.  Psychiatric:        Behavior: Behavior normal.        Thought Content: Thought content normal.        Judgment: Judgment normal.          BP (!) 145/77   Pulse 73   Temp 98 F (36.7 C)   Ht _0  (1.803 m)   Wt 273 lb (123.8 kg)   SpO2 98%   BMI 38.08 kg/m   Assessment & Plan:  Gregory Carey comes in today with chief complaint of Medical Management of Chronic Issues and Diabetes   Diagnosis and orders addressed:  1. Type 2 diabetes mellitus with hyperglycemia, with long-term current use of insulin (HCC) - Bayer DCA Hb A1c Waived - Dulaglutide (TRULICITY) 8.82 CM/0.3KJ SOPN; INJECT 0.75MG SUBCUTANEOUSLY ONCE WEEKLY  Dispense: 0.15 mL; Refill: 6 - metFORMIN (GLUCOPHAGE-XR) 750 MG 24 hr tablet; Take 1 tablet (750 mg total) by mouth daily with breakfast. (Needs to be seen before next refill)  Dispense: 30 tablet; Refill: 0 - CBC with Differential/Platelet - CMP14+EGFR  2. Essential hypertension - amLODipine (NORVASC) 5 MG tablet; Take 1 tablet (5 mg total) by mouth daily.  Dispense: 90 tablet; Refill: 3 - lisinopril-hydrochlorothiazide (ZESTORETIC) 20-12.5 MG tablet; Take 2 tablets by mouth daily.  Dispense: 60 tablet; Refill: 0 - CBC with Differential/Platelet - CMP14+EGFR  3. Hyperlipidemia, unspecified hyperlipidemia type - rosuvastatin (CRESTOR) 20 MG tablet; Take 1 tablet (20 mg total) by mouth daily.  Dispense: 30 tablet; Refill: 0 - CBC with Differential/Platelet - CMP14+EGFR - Lipid panel  4. OSA (obstructive sleep apnea) - CBC with Differential/Platelet - CMP14+EGFR  5. Morbid obesity ([place]) - CBC with Differential/Platelet - CMP14+EGFR  6. Primary osteoarthritis of right knee - CBC with Differential/Platelet - CMP14+EGFR   Labs pending Health Maintenance reviewed Diet and exercise encouraged  Follow up plan: 6 months    Evelina Dun, FNP

## 2019-09-28 ENCOUNTER — Other Ambulatory Visit: Payer: Self-pay | Admitting: Family

## 2019-09-28 DIAGNOSIS — I10 Essential (primary) hypertension: Secondary | ICD-10-CM

## 2019-09-28 DIAGNOSIS — E785 Hyperlipidemia, unspecified: Secondary | ICD-10-CM

## 2019-10-09 ENCOUNTER — Other Ambulatory Visit: Payer: Self-pay | Admitting: Family

## 2019-10-09 DIAGNOSIS — Z794 Long term (current) use of insulin: Secondary | ICD-10-CM

## 2019-10-09 DIAGNOSIS — E1165 Type 2 diabetes mellitus with hyperglycemia: Secondary | ICD-10-CM

## 2019-11-07 DIAGNOSIS — D485 Neoplasm of uncertain behavior of skin: Secondary | ICD-10-CM | POA: Diagnosis not present

## 2019-11-07 DIAGNOSIS — L819 Disorder of pigmentation, unspecified: Secondary | ICD-10-CM | POA: Diagnosis not present

## 2019-11-07 DIAGNOSIS — C4442 Squamous cell carcinoma of skin of scalp and neck: Secondary | ICD-10-CM | POA: Diagnosis not present

## 2019-11-07 DIAGNOSIS — Z85828 Personal history of other malignant neoplasm of skin: Secondary | ICD-10-CM | POA: Diagnosis not present

## 2019-11-07 DIAGNOSIS — L570 Actinic keratosis: Secondary | ICD-10-CM | POA: Diagnosis not present

## 2019-11-07 DIAGNOSIS — L905 Scar conditions and fibrosis of skin: Secondary | ICD-10-CM | POA: Diagnosis not present

## 2019-11-17 DIAGNOSIS — C4442 Squamous cell carcinoma of skin of scalp and neck: Secondary | ICD-10-CM | POA: Diagnosis not present

## 2019-12-07 DIAGNOSIS — L570 Actinic keratosis: Secondary | ICD-10-CM | POA: Diagnosis not present

## 2019-12-07 DIAGNOSIS — L819 Disorder of pigmentation, unspecified: Secondary | ICD-10-CM | POA: Diagnosis not present

## 2019-12-26 ENCOUNTER — Other Ambulatory Visit: Payer: Self-pay | Admitting: *Deleted

## 2019-12-26 DIAGNOSIS — I10 Essential (primary) hypertension: Secondary | ICD-10-CM

## 2019-12-26 MED ORDER — LISINOPRIL-HYDROCHLOROTHIAZIDE 20-12.5 MG PO TABS: 2.0000 | ORAL_TABLET | Freq: Every day | ORAL | 1 refills | Status: DC

## 2019-12-29 ENCOUNTER — Ambulatory Visit: Payer: BC Managed Care – PPO | Admitting: Family

## 2020-01-10 ENCOUNTER — Other Ambulatory Visit: Payer: Self-pay | Admitting: Family

## 2020-01-10 DIAGNOSIS — E1165 Type 2 diabetes mellitus with hyperglycemia: Secondary | ICD-10-CM

## 2020-01-10 DIAGNOSIS — Z794 Long term (current) use of insulin: Secondary | ICD-10-CM

## 2020-01-22 ENCOUNTER — Ambulatory Visit: Payer: BC Managed Care – PPO | Admitting: Family

## 2020-01-30 ENCOUNTER — Encounter: Payer: Self-pay | Admitting: Family

## 2020-01-30 ENCOUNTER — Ambulatory Visit (INDEPENDENT_AMBULATORY_CARE_PROVIDER_SITE_OTHER): Payer: BC Managed Care – PPO | Admitting: Family

## 2020-01-30 ENCOUNTER — Other Ambulatory Visit: Payer: Self-pay

## 2020-01-30 VITALS — BP 121/71 | HR 88 | Temp 98.3°F | Ht 71.0 in | Wt 271.4 lb

## 2020-01-30 DIAGNOSIS — Z0001 Encounter for general adult medical examination with abnormal findings: Secondary | ICD-10-CM | POA: Diagnosis not present

## 2020-01-30 DIAGNOSIS — I10 Essential (primary) hypertension: Secondary | ICD-10-CM | POA: Diagnosis not present

## 2020-01-30 DIAGNOSIS — Z23 Encounter for immunization: Secondary | ICD-10-CM | POA: Diagnosis not present

## 2020-01-30 DIAGNOSIS — E1165 Type 2 diabetes mellitus with hyperglycemia: Secondary | ICD-10-CM | POA: Diagnosis not present

## 2020-01-30 DIAGNOSIS — M1711 Unilateral primary osteoarthritis, right knee: Secondary | ICD-10-CM

## 2020-01-30 DIAGNOSIS — Z0000 Encounter for general adult medical examination without abnormal findings: Secondary | ICD-10-CM

## 2020-01-30 DIAGNOSIS — R739 Hyperglycemia, unspecified: Secondary | ICD-10-CM | POA: Diagnosis not present

## 2020-01-30 DIAGNOSIS — G4733 Obstructive sleep apnea (adult) (pediatric): Secondary | ICD-10-CM

## 2020-01-30 DIAGNOSIS — E785 Hyperlipidemia, unspecified: Secondary | ICD-10-CM

## 2020-01-30 DIAGNOSIS — Z1211 Encounter for screening for malignant neoplasm of colon: Secondary | ICD-10-CM

## 2020-01-30 DIAGNOSIS — Z794 Long term (current) use of insulin: Secondary | ICD-10-CM

## 2020-01-30 LAB — BAYER DCA HB A1C WAIVED: HB A1C (BAYER DCA - WAIVED): 6.2 % (ref <7.0)

## 2020-01-30 MED ORDER — AMLODIPINE BESYLATE 5 MG PO TABS: 5.0000 mg | ORAL_TABLET | Freq: Every day | ORAL | 3 refills | Status: DC

## 2020-01-30 MED ORDER — ROSUVASTATIN CALCIUM 20 MG PO TABS: 20.0000 mg | ORAL_TABLET | Freq: Every day | ORAL | 3 refills | Status: DC

## 2020-01-30 MED ORDER — LISINOPRIL-HYDROCHLOROTHIAZIDE 20-12.5 MG PO TABS: 2.0000 | ORAL_TABLET | Freq: Every day | ORAL | 1 refills | Status: DC

## 2020-01-30 MED ORDER — TRULICITY 0.75 MG/0.5ML ~~LOC~~ SOAJ: SUBCUTANEOUS | 6 refills | Status: DC

## 2020-01-30 MED ORDER — METFORMIN HCL ER 750 MG PO TB24: ORAL_TABLET | ORAL | 1 refills | Status: DC

## 2020-01-30 NOTE — Progress Notes (Signed)
Subjective:    Patient ID: Gregory Carey, male    DOB: [birth year], 59 y.o.   MRN: [id number]  Chief Complaint  Patient presents with  . Diabetes  . Hypertension   PT presents to the office today for CPE and  chronic follow up.He is followed by Ortho and is planning right knee surgery in the future.  Diabetes He presents for his follow-up diabetic visit. He has type 2 diabetes mellitus. His disease course has been stable. There are no hypoglycemic associated symptoms. Pertinent negatives for diabetes include no blurred vision and no foot paresthesias. There are no hypoglycemic complications. Symptoms are stable. Pertinent negatives for diabetic complications include no CVA or heart disease. Risk factors for coronary artery disease include diabetes mellitus, dyslipidemia, hypertension, male sex and sedentary lifestyle. He is following a generally unhealthy diet. His overall blood glucose range is 110-130 mg/dl. An ACE inhibitor/angiotensin II receptor blocker is being taken. Eye exam is not current.  Hypertension This is a chronic problem. The current episode started more than 1 year ago. The problem has been waxing and waning since onset. The problem is uncontrolled. Associated symptoms include malaise/fatigue. Pertinent negatives include no blurred vision, peripheral edema or shortness of breath. Risk factors for coronary artery disease include diabetes mellitus, dyslipidemia, obesity, male gender and sedentary lifestyle. The current treatment provides moderate improvement. There is no history of CVA.  Hyperlipidemia This is a chronic problem. The current episode started more than 1 year ago. The problem is controlled. Recent lipid tests were reviewed and are normal. Exacerbating diseases include obesity. Pertinent negatives include no shortness of breath. Current antihyperlipidemic treatment includes statins. The current treatment provides moderate improvement of lipids. Risk factors for  coronary artery disease include dyslipidemia, diabetes mellitus, male sex, hypertension and a sedentary lifestyle.  OSA Uses CPAP nightly. Stable.    Review of Systems  Constitutional: Positive for malaise/fatigue.  Eyes: Negative for blurred vision.  Respiratory: Negative for shortness of breath.   All other systems reviewed and are negative.  Family History  Problem Relation Age of Onset  . CAD Father   . Diabetes Father   . Diabetes Paternal Grandmother    Social History   Socioeconomic History  . Marital status: Married    Spouse name: Not on file  . Number of children: Not on file  . Years of education: Not on file  . Highest education level: Not on file  Occupational History  . Not on file  Tobacco Use  . Smoking status: Never Smoker  . Smokeless tobacco: Never Used  Vaping Use  . Vaping Use: Never used  Substance and Sexual Activity  . Alcohol use: No  . Drug use: No  . Sexual activity: Yes  Other Topics Concern  . Not on file  Social History Narrative  . Not on file   Social Determinants of Health   Financial Resource Strain: Not on file  Food Insecurity: Not on file  Transportation Needs: Not on file  Physical Activity: Not on file  Stress: Not on file  Social Connections: Not on file       Objective:   Physical Exam Vitals reviewed.  Constitutional:      General: He is not in acute distress.    Appearance: He is well-developed and well-nourished.  HENT:     Head: Normocephalic.     Right Ear: Tympanic membrane normal.     Left Ear: Tympanic membrane normal.     Mouth/Throat:  Mouth: Oropharynx is clear and moist.  Eyes:     General:        Right eye: No discharge.        Left eye: No discharge.     Pupils: Pupils are equal, round, and reactive to light.  Neck:     Thyroid: No thyromegaly.  Cardiovascular:     Rate and Rhythm: Normal rate and regular rhythm.     Pulses: Intact distal pulses.     Heart sounds: Normal heart sounds.  No murmur heard.   Pulmonary:     Effort: Pulmonary effort is normal. No respiratory distress.     Breath sounds: Normal breath sounds. No wheezing.  Abdominal:     General: Bowel sounds are normal. There is no distension.     Palpations: Abdomen is soft.     Tenderness: There is no abdominal tenderness.  Musculoskeletal:        General: No tenderness or edema. Normal range of motion.     Cervical back: Normal range of motion and neck supple.  Skin:    General: Skin is warm and dry.     Findings: No erythema or rash.  Neurological:     Mental Status: He is alert and oriented to person, place, and time.     Cranial Nerves: No cranial nerve deficit.     Deep Tendon Reflexes: Reflexes are normal and symmetric.  Psychiatric:        Mood and Affect: Mood and affect normal.        Behavior: Behavior normal.        Thought Content: Thought content normal.        Judgment: Judgment normal.       BP 121/71 Comment: Patient reported at home  Pulse 88   Temp 98.3 F (36.8 C) (Temporal)   Ht _0  (1.803 m)   Wt 271 lb 6.4 oz (123.1 kg)   SpO2 98%   BMI 37.85 kg/m      Assessment & Plan:  Gregory Carey comes in today with chief complaint of Diabetes and Hypertension   Diagnosis and orders addressed:  1. Essential hypertension - lisinopril-hydrochlorothiazide (ZESTORETIC) 20-12.5 MG tablet; Take 2 tablets by mouth daily.  Dispense: 180 tablet; Refill: 1 - amLODipine (NORVASC) 5 MG tablet; Take 1 tablet (5 mg total) by mouth daily.  Dispense: 90 tablet; Refill: 3 - CMP14+EGFR - CBC with Differential/Platelet  2. Hyperlipidemia, unspecified hyperlipidemia type - rosuvastatin (CRESTOR) 20 MG tablet; Take 1 tablet (20 mg total) by mouth daily.  Dispense: 90 tablet; Refill: 3 - CMP14+EGFR - CBC with Differential/Platelet  3. Type 2 diabetes mellitus with hyperglycemia, with long-term current use of insulin (HCC) - metFORMIN (GLUCOPHAGE-XR) 750 MG 24 hr tablet; Take 1  tablet by mouth once daily with breakfast  Dispense: 90 tablet; Refill: 1 - Dulaglutide (TRULICITY) 0.53 ZJ/6.7HA SOPN; INJECT 0.75MG SUBCUTANEOUSLY ONCE WEEKLY  Dispense: 0.15 mL; Refill: 6 - CMP14+EGFR - CBC with Differential/Platelet  4. Need for immunization against influenza - Flu Vaccine QUAD 36+ mos IM - CMP14+EGFR - CBC with Differential/Platelet  5. OSA (obstructive sleep apnea) - CMP14+EGFR - CBC with Differential/Platelet  6. Primary osteoarthritis of right knee - CMP14+EGFR - CBC with Differential/Platelet  7. Morbid obesity ([place]) - CMP14+EGFR - CBC with Differential/Platelet  8. Annual physical exam - CMP14+EGFR - CBC with Differential/Platelet - Lipid panel - Bayer DCA Hb A1c Waived - TSH - PSA, total and free  9. Colon cancer screening -  Ambulatory referral to Gastroenterology - CMP14+EGFR - CBC with Differential/Platelet   Labs pending Health Maintenance reviewed Diet and exercise encouraged  Follow up plan: 6 months    Evelina Dun, FNP

## 2020-01-30 NOTE — Patient Instructions (Signed)

## 2020-01-31 LAB — CBC WITH DIFFERENTIAL/PLATELET
Basophils Absolute: 0.1 10*3/uL (ref 0.0–0.2)
Basos: 1 %
EOS (ABSOLUTE): 0.3 10*3/uL (ref 0.0–0.4)
Eos: 4 %
Hematocrit: 46.4 % (ref 37.5–51.0)
Hemoglobin: 16.2 g/dL (ref 13.0–17.7)
Immature Grans (Abs): 0 10*3/uL (ref 0.0–0.1)
Immature Granulocytes: 0 %
Lymphocytes Absolute: 2.4 10*3/uL (ref 0.7–3.1)
Lymphs: 29 %
MCH: 29.8 pg (ref 26.6–33.0)
MCHC: 34.9 g/dL (ref 31.5–35.7)
MCV: 86 fL (ref 79–97)
Monocytes Absolute: 0.6 10*3/uL (ref 0.1–0.9)
Monocytes: 7 %
Neutrophils Absolute: 4.7 10*3/uL (ref 1.4–7.0)
Neutrophils: 59 %
Platelets: 308 10*3/uL (ref 150–450)
RBC: 5.43 x10E6/uL (ref 4.14–5.80)
RDW: 13 % (ref 11.6–15.4)
WBC: 8.2 10*3/uL (ref 3.4–10.8)

## 2020-01-31 LAB — CMP14+EGFR
ALT: 38 IU/L (ref 0–44)
AST: 26 IU/L (ref 0–40)
Albumin/Globulin Ratio: 2.4 — ABNORMAL HIGH (ref 1.2–2.2)
Albumin: 4.7 g/dL (ref 3.8–4.9)
Alkaline Phosphatase: 61 IU/L (ref 44–121)
BUN/Creatinine Ratio: 13 (ref 9–20)
BUN: 13 mg/dL (ref 6–24)
Bilirubin Total: 0.6 mg/dL (ref 0.0–1.2)
CO2: 26 mmol/L (ref 20–29)
Calcium: 10 mg/dL (ref 8.7–10.2)
Chloride: 103 mmol/L (ref 96–106)
Creatinine, Ser: 0.98 mg/dL (ref 0.76–1.27)
GFR calc Af Amer: 98 mL/min/{1.73_m2} (ref >59)
GFR calc non Af Amer: 85 mL/min/{1.73_m2} (ref >59)
Globulin, Total: 2 g/dL (ref 1.5–4.5)
Glucose: 107 mg/dL — ABNORMAL HIGH (ref 65–99)
Potassium: 3.9 mmol/L (ref 3.5–5.2)
Sodium: 141 mmol/L (ref 134–144)
Total Protein: 6.7 g/dL (ref 6.0–8.5)

## 2020-01-31 LAB — LIPID PANEL
Chol/HDL Ratio: 3.1 ratio (ref 0.0–5.0)
Cholesterol, Total: 103 mg/dL (ref 100–199)
HDL: 33 mg/dL — ABNORMAL LOW (ref >39)
LDL Chol Calc (NIH): 42 mg/dL (ref 0–99)
Triglycerides: 164 mg/dL — ABNORMAL HIGH (ref 0–149)
VLDL Cholesterol Cal: 28 mg/dL (ref 5–40)

## 2020-01-31 LAB — PSA, TOTAL AND FREE
PSA, Free Pct: 28 %
PSA, Free: 0.14 ng/mL
Prostate Specific Ag, Serum: 0.5 ng/mL (ref 0.0–4.0)

## 2020-01-31 LAB — TSH: TSH: 0.663 u[IU]/mL (ref 0.450–4.500)

## 2020-02-05 ENCOUNTER — Encounter: Payer: Self-pay | Admitting: Internal Medicine

## 2020-03-05 ENCOUNTER — Ambulatory Visit: Payer: BC Managed Care – PPO

## 2020-03-11 ENCOUNTER — Other Ambulatory Visit: Payer: Self-pay

## 2020-03-11 ENCOUNTER — Ambulatory Visit (INDEPENDENT_AMBULATORY_CARE_PROVIDER_SITE_OTHER): Payer: Self-pay | Admitting: *Deleted

## 2020-03-11 DIAGNOSIS — Z1211 Encounter for screening for malignant neoplasm of colon: Secondary | ICD-10-CM

## 2020-03-11 NOTE — Progress Notes (Signed)
Do to his history of difficult intubation and previous classification of ASA III, we need to have him in for OV per protocol.

## 2020-03-11 NOTE — Progress Notes (Signed)
Gastroenterology Pre-Procedure Review  Request Date: 03/11/2020 Requesting Physician: Evelina Dun, NP @ [facility], no previous TCS  PATIENT REVIEW QUESTIONS: The patient responded to the following health history questions as indicated:    1. Diabetes Melitis: yes, type II 2. Joint replacements in the past 12 months: no 3. Major health problems in the past 3 months: no 4. Has an artificial valve or MVP: no 5. Has a defibrillator: no 6. Has been advised in past to take antibiotics in advance of a procedure like teeth cleaning: no 7. Family history of colon cancer: no  8. Alcohol Use: no 9. Illicit drug Use: no 10. History of sleep apnea: yes, CPAP  11. History of coronary artery or other vascular stents placed within the last 12 months: no 12. History of any prior anesthesia complications: no 13. There is no height or weight on file to calculate BMI. ht: 5'10 wt: 265 lbs    MEDICATIONS & ALLERGIES:    Patient reports the following regarding taking any blood thinners:   Plavix? no Aspirin? yes Coumadin? no Brilinta? no Xarelto? no Eliquis? no Pradaxa? no Savaysa? no Effient? no  Patient confirms/reports the following medications:  Current Outpatient Medications  Medication Sig Dispense Refill  . amLODipine (NORVASC) 5 MG tablet Take 1 tablet (5 mg total) by mouth daily. 90 tablet 3  . aspirin EC 81 MG tablet Take 1 tablet (81 mg total) by mouth daily. 90 tablet 1  . blood glucose meter kit and supplies Dispense based on patient and insurance preference. Use up to four times daily as directed. (FOR ICD-9 250.00, 250.01). 1 each 0  . CONTOUR NEXT TEST test strip USE AS DIRECTED TWICE DAILY 100 each 0  . Dulaglutide (TRULICITY) 7.56 EP/3.2RJ SOPN INJECT 0.75MG SUBCUTANEOUSLY ONCE WEEKLY 0.15 mL 6  . lisinopril-hydrochlorothiazide (ZESTORETIC) 20-12.5 MG tablet Take 2 tablets by mouth daily. 180 tablet 1  . metFORMIN (GLUCOPHAGE-XR) 750 MG 24 hr tablet Take 1 tablet by mouth once daily  with breakfast 90 tablet 1  . rosuvastatin (CRESTOR) 20 MG tablet Take 1 tablet (20 mg total) by mouth daily. 90 tablet 3   No current facility-administered medications for this visit.    Patient confirms/reports the following allergies:  No Known Allergies  No orders of the defined types were placed in this encounter.   AUTHORIZATION INFORMATION Primary Insurance: [place] ,  [place] [id number] ,  Group #: [id number] Pre-Cert / Josem Kaufmann required: No, not required  SCHEDULE INFORMATION: Procedure has been scheduled as follows:  Date: , Time:  Location: [facility] with Dr. Abbey Chatters  This Gastroenterology Pre-Precedure Review Form is being routed to the following provider(s): Neil Crouch, PA

## 2020-03-12 NOTE — Progress Notes (Signed)
Called pt and made him aware that he needed ov to arrange TCS due to medical history.  Pt voiced understanding.  Scheduled ov for 03/14/2020 at 9:30 with Aliene Altes, PA.

## 2020-03-13 NOTE — Progress Notes (Signed)
Referring Provider:  Sharion Balloon, FNP Primary Care Physician:  Sharion Balloon, FNP Primary Gastroenterologist:  Dr. Abbey Chatters  Chief Complaint  Patient presents with  . Consult    TCS never done prior.    HPI:   Gregory Carey is a 60 y.o. male presenting today at the request of Evelina Dun, NP for consult colonoscopy. Patient was initially a nurse triage, but due history of difficult intubation and previously classified as ASA III, patient would need OV to arrange.   Today: No prior colonoscopy.  No family history of colon cancer or colon polyps.  No GI concerns. No abdominal pain, constipation, diarrhea, blood in the stool, or black stools. No unintentional weight loss. No GERD, dysphagia, nausea, or vomiting.  History of difficult intubation due to small mouth/throat.   Occasional use of ibuprofen. Nothing routinely.   Past Medical History:  Diagnosis Date  . Diabetes mellitus without complication ([place])   . Difficult intubation    Was infomed by MD Anesth his airway was small  . Hyperlipidemia   . Hypertension   . Localized osteoarthritis of left shoulder   . Osteoarthritis of left shoulder 09/29/2018  . Sleep apnea    uses CPAP    Past Surgical History:  Procedure Laterality Date  . KNEE ARTHROSCOPY Right 2008  . SHOULDER ARTHROSCOPY Right 2002  . TOTAL SHOULDER ARTHROPLASTY Left 09/29/2018   Procedure: TOTAL SHOULDER ARTHROPLASTY;  Surgeon: Nicholes Stairs, MD;  Location: [facility];  Service: Orthopedics;  Laterality: Left;  2.5 hrs    Current Outpatient Medications  Medication Sig Dispense Refill  . amLODipine (NORVASC) 5 MG tablet Take 1 tablet (5 mg total) by mouth daily. 90 tablet 3  . aspirin EC 81 MG tablet Take 1 tablet (81 mg total) by mouth daily. 90 tablet 1  . blood glucose meter kit and supplies Dispense based on patient and insurance preference. Use up to four times daily as directed. (FOR ICD-9 250.00, 250.01). 1 each 0  . CONTOUR NEXT TEST  test strip USE AS DIRECTED TWICE DAILY 100 each 0  . Dulaglutide (TRULICITY) 7.34 KA/7.6OT SOPN INJECT 0.75MG SUBCUTANEOUSLY ONCE WEEKLY 0.15 mL 6  . lisinopril-hydrochlorothiazide (ZESTORETIC) 20-12.5 MG tablet Take 2 tablets by mouth daily. 180 tablet 1  . metFORMIN (GLUCOPHAGE-XR) 750 MG 24 hr tablet Take 1 tablet by mouth once daily with breakfast 90 tablet 1  . rosuvastatin (CRESTOR) 20 MG tablet Take 1 tablet (20 mg total) by mouth daily. 90 tablet 3   No current facility-administered medications for this visit.    Allergies as of 03/14/2020  . (No Known Allergies)    Family History  Problem Relation Age of Onset  . CAD Father   . Diabetes Father   . Diabetes Paternal Grandmother   . Colon cancer Neg Hx   . Colon polyps Neg Hx     Social History   Socioeconomic History  . Marital status: Married    Spouse name: Not on file  . Number of children: Not on file  . Years of education: Not on file  . Highest education level: Not on file  Occupational History  . Not on file  Tobacco Use  . Smoking status: Never Smoker  . Smokeless tobacco: Never Used  Vaping Use  . Vaping Use: Never used  Substance and Sexual Activity  . Alcohol use: No  . Drug use: No  . Sexual activity: Yes  Other Topics Concern  . Not on file  Social History Narrative  . Not on file   Social Determinants of Health   Financial Resource Strain: Not on file  Food Insecurity: Not on file  Transportation Needs: Not on file  Physical Activity: Not on file  Stress: Not on file  Social Connections: Not on file  Intimate Partner Violence: Not on file    Review of Systems: Gen: Denies any fever, chills, cold or flulike symptoms, presyncope, syncope. CV: Denies chest pain or palpitations. Resp: Denies shortness of breath or cough. GI: See HPI GU : Denies urinary burning, urinary frequency, urinary hesitancy MS: Chronic intermittent knee pain.  Derm: Denies rash Psych: Denies depression or  anxiety. Heme: See HPI  Physical Exam: BP (!) 162/88   Pulse 70   Temp (!) 97.1 F (36.2 C)   Ht 5' 11"  (1.803 m)   Wt 272 lb (123.4 kg)   BMI 37.94 kg/m  General: Alert and oriented. Pleasant and cooperative. Well-nourished and well-developed.  Head:  Normocephalic and atraumatic. Eyes:  Without icterus, sclera clear and conjunctiva pink.  Ears:  Normal auditory acuity. Lungs: Clear to auscultation bilaterally. No wheezes, rales, or rhonchi. No distress.  Heart: S1, S2 present without murmurs appreciated.  Abdomen:  +BS, soft, non-tender and non-distended. No HSM noted. No guarding or rebound. No masses appreciated.  Rectal:  Deferred  Msk:  Symmetrical without gross deformities. Normal posture. Extremities:  Without edema. Neurologic:  Alert and  oriented x4;  grossly normal neurologically. Skin:  Intact without significant lesions or rashes. Psych: Normal mood and affect.

## 2020-03-14 ENCOUNTER — Encounter: Payer: Self-pay | Admitting: Gastroenterology

## 2020-03-14 ENCOUNTER — Telehealth: Payer: Self-pay | Admitting: *Deleted

## 2020-03-14 ENCOUNTER — Ambulatory Visit (INDEPENDENT_AMBULATORY_CARE_PROVIDER_SITE_OTHER): Payer: BC Managed Care – PPO | Admitting: Gastroenterology

## 2020-03-14 ENCOUNTER — Other Ambulatory Visit: Payer: Self-pay

## 2020-03-14 ENCOUNTER — Encounter: Payer: Self-pay | Admitting: *Deleted

## 2020-03-14 DIAGNOSIS — Z1211 Encounter for screening for malignant neoplasm of colon: Secondary | ICD-10-CM | POA: Diagnosis not present

## 2020-03-14 NOTE — Telephone Encounter (Signed)
Called pt. Advised scheduled on wrong date. He has been now been changed to 3/8 (am appt). Aware will mail prep instructions with pre-op/covid test appt. Also aware endo will inform of arrival time for procedure.

## 2020-03-14 NOTE — Patient Instructions (Signed)
We will arrange for you to have a colonoscopy in the near future with Dr. Abbey Chatters. 1 day prior to procedure: You may continue your diabetes medications as prescribed. Day of procedure: Do not take any diabetes medications the morning of your procedure.  We will see back in the office as needed.  Do not hesitate to call if you have any new GI concerns.  Aliene Altes, PA-C [facility]

## 2020-03-14 NOTE — Progress Notes (Signed)
Cc'ed to pcp °

## 2020-03-14 NOTE — Assessment & Plan Note (Addendum)
60 year old male with history of HTN, HLD, sleep apnea using CPAP, diabetes, and history of difficult intubation presenting today to schedule first ever colonoscopy.  No significant upper or lower GI symptoms.  No alarm symptoms.  No family history of colon cancer or colon polyps.  Plan:  Proceed with colonoscopy with propofol with Dr. Abbey Chatters in the near future. The risks, benefits, and alternatives have been discussed with the patient in detail. The patient states understanding and desires to proceed.  ASA III See separate instructions for diabetes medication adjustments. Follow-up as needed.  Advised to call with any new GI concerns.

## 2020-03-15 ENCOUNTER — Encounter: Payer: Self-pay | Admitting: *Deleted

## 2020-03-21 DIAGNOSIS — D485 Neoplasm of uncertain behavior of skin: Secondary | ICD-10-CM | POA: Diagnosis not present

## 2020-03-21 DIAGNOSIS — C44329 Squamous cell carcinoma of skin of other parts of face: Secondary | ICD-10-CM | POA: Diagnosis not present

## 2020-04-08 ENCOUNTER — Other Ambulatory Visit: Payer: Self-pay | Admitting: Family

## 2020-04-08 DIAGNOSIS — Z794 Long term (current) use of insulin: Secondary | ICD-10-CM

## 2020-04-08 DIAGNOSIS — D045 Carcinoma in situ of skin of trunk: Secondary | ICD-10-CM | POA: Diagnosis not present

## 2020-04-08 DIAGNOSIS — D485 Neoplasm of uncertain behavior of skin: Secondary | ICD-10-CM | POA: Diagnosis not present

## 2020-04-08 DIAGNOSIS — L819 Disorder of pigmentation, unspecified: Secondary | ICD-10-CM | POA: Diagnosis not present

## 2020-04-08 DIAGNOSIS — L570 Actinic keratosis: Secondary | ICD-10-CM | POA: Diagnosis not present

## 2020-04-08 DIAGNOSIS — L814 Other melanin hyperpigmentation: Secondary | ICD-10-CM | POA: Diagnosis not present

## 2020-04-08 DIAGNOSIS — E1165 Type 2 diabetes mellitus with hyperglycemia: Secondary | ICD-10-CM

## 2020-04-08 DIAGNOSIS — D1801 Hemangioma of skin and subcutaneous tissue: Secondary | ICD-10-CM | POA: Diagnosis not present

## 2020-04-08 DIAGNOSIS — L718 Other rosacea: Secondary | ICD-10-CM | POA: Diagnosis not present

## 2020-04-10 ENCOUNTER — Other Ambulatory Visit: Payer: Self-pay | Admitting: Family

## 2020-04-10 DIAGNOSIS — I10 Essential (primary) hypertension: Secondary | ICD-10-CM

## 2020-04-10 DIAGNOSIS — C44329 Squamous cell carcinoma of skin of other parts of face: Secondary | ICD-10-CM | POA: Diagnosis not present

## 2020-04-18 ENCOUNTER — Encounter (HOSPITAL_COMMUNITY): Payer: Self-pay

## 2020-04-18 NOTE — Patient Instructions (Signed)
[facility]  04/18/2020     @PREFPERIOPPHARMACY @   Your procedure is scheduled on  04/23/2020.   Report to [facility] at  [phone]  A.M.   Call this number if you have problems the morning of surgery:  [phone]   Remember:  Follow the diet and prep instructions given to you by the office.                        Take these medicines the morning of surgery with A SIP OF WATER  Amlodipine.  DO NOT take any medications for diabetes the morning of your procedure.  If your glucose is 70 or below the morning of your procedure, drink 1/2 cup of clear juice and recheck your glucose in 15 minutes. If it is still 70 or below, call [phone] for instructions.  If your glucose is 300 or above the morning of your procedure, call [phone] for instructions.      Do not wear jewelry, make-up or nail polish.  Do not wear lotions, powders, or perfumes, or deodorant.  Do not shave 48 hours prior to surgery.  Men may shave face and neck.  Do not bring valuables to the hospital.  [facility] is not responsible for any belongings or valuables.  Contacts, dentures or bridgework may not be worn into surgery.  Leave your suitcase in the car.  After surgery it may be brought to your room.  For patients admitted to the hospital, discharge time will be determined by your treatment team.  Patients discharged the day of surgery will not be allowed to drive home and must have someone with them for 24 hours.   Special instructions:   DO NOT smoke tobacco or ape the morning of your procedure.   Please read over the following fact sheets that you were given. Anesthesia Post-op Instructions and Care and Recovery After Surgery       Colonoscopy, Adult, Care After This sheet gives you information about how to care for yourself after your procedure. Your health care provider may also give you more specific instructions. If you have problems or questions, contact your health care  provider. What can I expect after the procedure? After the procedure, it is common to have:  A small amount of blood in your stool for 24 hours after the procedure.  Some gas.  Mild cramping or bloating of your abdomen. Follow these instructions at home: Eating and drinking  Drink enough fluid to keep your urine pale yellow.  Follow instructions from your health care provider about eating or drinking restrictions.  Resume your normal diet as instructed by your health care provider. Avoid heavy or fried foods that are hard to digest.   Activity  Rest as told by your health care provider.  Avoid sitting for a long time without moving. Get up to take short walks every 1-2 hours. This is important to improve blood flow and breathing. Ask for help if you feel weak or unsteady.  Return to your normal activities as told by your health care provider. Ask your health care provider what activities are safe for you. Managing cramping and bloating  Try walking around when you have cramps or feel bloated.  Apply heat to your abdomen as told by your health care provider. Use the heat source that your health care provider recommends, such as a moist heat pack or a heating pad. ? Place a towel  between your skin and the heat source. ? Leave the heat on for 20-30 minutes. ? Remove the heat if your skin turns bright red. This is especially important if you are unable to feel pain, heat, or cold. You may have a greater risk of getting burned.   General instructions  If you were given a sedative during the procedure, it can affect you for several hours. Do not drive or operate machinery until your health care provider says that it is safe.  For the first 24 hours after the procedure: ? Do not sign important documents. ? Do not drink alcohol. ? Do your regular daily activities at a slower pace than normal. ? Eat soft foods that are easy to digest.  Take over-the-counter and prescription medicines  only as told by your health care provider.  Keep all follow-up visits as told by your health care provider. This is important. Contact a health care provider if:  You have blood in your stool 2-3 days after the procedure. Get help right away if you have:  More than a small spotting of blood in your stool.  Large blood clots in your stool.  Swelling of your abdomen.  Nausea or vomiting.  A fever.  Increasing pain in your abdomen that is not relieved with medicine. Summary  After the procedure, it is common to have a small amount of blood in your stool. You may also have mild cramping and bloating of your abdomen.  If you were given a sedative during the procedure, it can affect you for several hours. Do not drive or operate machinery until your health care provider says that it is safe.  Get help right away if you have a lot of blood in your stool, nausea or vomiting, a fever, or increased pain in your abdomen. This information is not intended to replace advice given to you by your health care provider. Make sure you discuss any questions you have with your health care provider. Document Revised: 01/27/2019 Document Reviewed: 08/29/2018 Elsevier Patient Education  2021 [place] After This sheet gives you information about how to care for yourself after your procedure. Your health care provider may also give you more specific instructions. If you have problems or questions, contact your health care provider. What can I expect after the procedure? After the procedure, it is common to have:  Tiredness.  Forgetfulness about what happened after the procedure.  Impaired judgment for important decisions.  Nausea or vomiting.  Some difficulty with balance. Follow these instructions at home: For the time period you were told by your health care provider:  Rest as needed.  Do not participate in activities where you could fall or become  injured.  Do not drive or use machinery.  Do not drink alcohol.  Do not take sleeping pills or medicines that cause drowsiness.  Do not make important decisions or sign legal documents.  Do not take care of children on your own.      Eating and drinking  Follow the diet that is recommended by your health care provider.  Drink enough fluid to keep your urine pale yellow.  If you vomit: ? Drink water, juice, or soup when you can drink without vomiting. ? Make sure you have little or no nausea before eating solid foods. General instructions  Have a responsible adult stay with you for the time you are told. It is important to have someone help care for you until you are  awake and alert.  Take over-the-counter and prescription medicines only as told by your health care provider.  If you have sleep apnea, surgery and certain medicines can increase your risk for breathing problems. Follow instructions from your health care provider about wearing your sleep device: ? Anytime you are sleeping, including during daytime naps. ? While taking prescription pain medicines, sleeping medicines, or medicines that make you drowsy.  Avoid smoking.  Keep all follow-up visits as told by your health care provider. This is important. Contact a health care provider if:  You keep feeling nauseous or you keep vomiting.  You feel light-headed.  You are still sleepy or having trouble with balance after 24 hours.  You develop a rash.  You have a fever.  You have redness or swelling around the IV site. Get help right away if:  You have trouble breathing.  You have new-onset confusion at home. Summary  For several hours after your procedure, you may feel tired. You may also be forgetful and have poor judgment.  Have a responsible adult stay with you for the time you are told. It is important to have someone help care for you until you are awake and alert.  Rest as told. Do not drive or  operate machinery. Do not drink alcohol or take sleeping pills.  Get help right away if you have trouble breathing, or if you suddenly become confused. This information is not intended to replace advice given to you by your health care provider. Make sure you discuss any questions you have with your health care provider. Document Revised: 10/19/2019 Document Reviewed: 01/05/2019 Elsevier Patient Education  2021 [employer].

## 2020-04-19 ENCOUNTER — Encounter (HOSPITAL_COMMUNITY)
Admission: RE | Admit: 2020-04-19 | Discharge: 2020-04-19 | Disposition: A | Discharge status: Home / Self Care | Payer: BC Managed Care – PPO | Source: Ambulatory Visit | Attending: Internal Medicine | Admitting: Internal Medicine

## 2020-04-19 ENCOUNTER — Other Ambulatory Visit (HOSPITAL_COMMUNITY)
Admission: RE | Admit: 2020-04-19 | Discharge: 2020-04-19 | Disposition: A | Discharge status: Home / Self Care | Payer: BC Managed Care – PPO | Source: Ambulatory Visit | Attending: Internal Medicine | Admitting: Internal Medicine

## 2020-04-19 ENCOUNTER — Other Ambulatory Visit: Payer: Self-pay

## 2020-04-19 DIAGNOSIS — Z20822 Contact with and (suspected) exposure to covid-19: Secondary | ICD-10-CM | POA: Insufficient documentation

## 2020-04-19 DIAGNOSIS — Z01818 Encounter for other preprocedural examination: Secondary | ICD-10-CM | POA: Insufficient documentation

## 2020-04-20 LAB — SARS CORONAVIRUS 2 (TAT 6-24 HRS): SARS Coronavirus 2: NEGATIVE

## 2020-04-23 ENCOUNTER — Ambulatory Visit (HOSPITAL_COMMUNITY): Payer: BC Managed Care – PPO | Admitting: Anesthesiology

## 2020-04-23 ENCOUNTER — Encounter (HOSPITAL_COMMUNITY): Payer: Self-pay

## 2020-04-23 ENCOUNTER — Encounter (HOSPITAL_COMMUNITY)
Admission: RE | Disposition: A | Discharge status: Home / Self Care | Payer: Self-pay | Source: Ambulatory Visit | Attending: Internal Medicine

## 2020-04-23 ENCOUNTER — Ambulatory Visit (HOSPITAL_COMMUNITY)
Admission: RE | Admit: 2020-04-23 | Discharge: 2020-04-23 | Disposition: A | Discharge status: Home / Self Care | Payer: BC Managed Care – PPO | Source: Ambulatory Visit | Attending: Internal Medicine | Admitting: Internal Medicine

## 2020-04-23 DIAGNOSIS — D120 Benign neoplasm of cecum: Secondary | ICD-10-CM | POA: Diagnosis not present

## 2020-04-23 DIAGNOSIS — D124 Benign neoplasm of descending colon: Secondary | ICD-10-CM | POA: Insufficient documentation

## 2020-04-23 DIAGNOSIS — Z7982 Long term (current) use of aspirin: Secondary | ICD-10-CM | POA: Diagnosis not present

## 2020-04-23 DIAGNOSIS — K648 Other hemorrhoids: Secondary | ICD-10-CM | POA: Insufficient documentation

## 2020-04-23 DIAGNOSIS — K6289 Other specified diseases of anus and rectum: Secondary | ICD-10-CM | POA: Diagnosis not present

## 2020-04-23 DIAGNOSIS — E119 Type 2 diabetes mellitus without complications: Secondary | ICD-10-CM | POA: Diagnosis not present

## 2020-04-23 DIAGNOSIS — K529 Noninfective gastroenteritis and colitis, unspecified: Secondary | ICD-10-CM | POA: Diagnosis not present

## 2020-04-23 DIAGNOSIS — Z1211 Encounter for screening for malignant neoplasm of colon: Secondary | ICD-10-CM | POA: Diagnosis not present

## 2020-04-23 DIAGNOSIS — Z8249 Family history of ischemic heart disease and other diseases of the circulatory system: Secondary | ICD-10-CM | POA: Insufficient documentation

## 2020-04-23 DIAGNOSIS — Z96612 Presence of left artificial shoulder joint: Secondary | ICD-10-CM | POA: Insufficient documentation

## 2020-04-23 DIAGNOSIS — Z7984 Long term (current) use of oral hypoglycemic drugs: Secondary | ICD-10-CM | POA: Diagnosis not present

## 2020-04-23 DIAGNOSIS — Z79899 Other long term (current) drug therapy: Secondary | ICD-10-CM | POA: Insufficient documentation

## 2020-04-23 DIAGNOSIS — Z833 Family history of diabetes mellitus: Secondary | ICD-10-CM | POA: Diagnosis not present

## 2020-04-23 DIAGNOSIS — K635 Polyp of colon: Secondary | ICD-10-CM | POA: Diagnosis not present

## 2020-04-23 DIAGNOSIS — K5730 Diverticulosis of large intestine without perforation or abscess without bleeding: Secondary | ICD-10-CM | POA: Insufficient documentation

## 2020-04-23 HISTORY — PX: BIOPSY: SHX5522

## 2020-04-23 HISTORY — PX: POLYPECTOMY: SHX5525

## 2020-04-23 HISTORY — PX: COLONOSCOPY WITH PROPOFOL: SHX5780

## 2020-04-23 LAB — GLUCOSE, CAPILLARY: Glucose-Capillary: 100 mg/dL — ABNORMAL HIGH (ref 70–99)

## 2020-04-23 SURGERY — COLONOSCOPY WITH PROPOFOL
Anesthesia: General

## 2020-04-23 MED ORDER — PROPOFOL 500 MG/50ML IV EMUL
INTRAVENOUS | Status: DC | PRN
  Administered 2020-04-23: 150 ug/kg/min via INTRAVENOUS

## 2020-04-23 MED ORDER — PROPOFOL 10 MG/ML IV BOLUS
INTRAVENOUS | Status: DC | PRN
  Administered 2020-04-23: 20 mg via INTRAVENOUS
  Administered 2020-04-23: 100 mg via INTRAVENOUS
  Administered 2020-04-23 (×2): 20 mg via INTRAVENOUS
  Administered 2020-04-23: 30 mg via INTRAVENOUS
  Administered 2020-04-23: 20 mg via INTRAVENOUS

## 2020-04-23 MED ORDER — STERILE WATER FOR IRRIGATION IR SOLN
Status: DC | PRN
  Administered 2020-04-23: 100 mL

## 2020-04-23 MED ORDER — LACTATED RINGERS IV SOLN: INTRAVENOUS | Status: DC

## 2020-04-23 NOTE — Transfer of Care (Signed)
Immediate Anesthesia Transfer of Care Note  Patient: Gregory Carey  Procedure(s) Performed: COLONOSCOPY WITH PROPOFOL (N/A ) BIOPSY POLYPECTOMY  Patient Location: PACU  Anesthesia Type:General  Level of Consciousness: awake, alert  and oriented  Airway & Oxygen Therapy: Patient Spontanous Breathing  Post-op Assessment: Report given to RN and Post -op Vital signs reviewed and stable  Post vital signs: Reviewed  Last Vitals:  Vitals Value Taken Time  BP    Temp    Pulse    Resp    SpO2      Last Pain:  Vitals:   04/23/20 0731  TempSrc:   PainSc: 0-No pain      Patients Stated Pain Goal: 5 (72/09/10 6816)  Complications: No complications documented.

## 2020-04-23 NOTE — Anesthesia Postprocedure Evaluation (Signed)
Anesthesia Post Note  Patient: Gregory Carey  Procedure(s) Performed: COLONOSCOPY WITH PROPOFOL (N/A ) BIOPSY POLYPECTOMY  Patient location during evaluation: Phase II Anesthesia Type: General Level of consciousness: awake and alert and oriented Pain management: satisfactory to patient Vital Signs Assessment: post-procedure vital signs reviewed and stable Respiratory status: spontaneous breathing and respiratory function stable Cardiovascular status: stable and blood pressure returned to baseline Postop Assessment: no apparent nausea or vomiting Anesthetic complications: no   No complications documented.   Last Vitals:  Vitals:   04/23/20 0646  Pulse: 66  Resp: 12  Temp: 36.8 C  SpO2: 97%    Last Pain:  Vitals:   04/23/20 0731  TempSrc:   PainSc: 0-No pain                 Karna Dupes

## 2020-04-23 NOTE — Anesthesia Preprocedure Evaluation (Signed)
Anesthesia Evaluation  Patient identified by MRN, date of birth, ID band Patient awake    Reviewed: Allergy & Precautions, NPO status , Patient's Chart, lab work & pertinent test results  History of Anesthesia Complications (+) DIFFICULT AIRWAY  Airway Mallampati: II  TM Distance: >3 FB Neck ROM: Full    Dental  (+) Dental Advisory Given, Missing   Pulmonary sleep apnea and Continuous Positive Airway Pressure Ventilation ,    Pulmonary exam normal breath sounds clear to auscultation       Cardiovascular Exercise Tolerance: Good hypertension, Pt. on medications Normal cardiovascular exam Rhythm:Regular Rate:Normal     Neuro/Psych negative neurological ROS  negative psych ROS   GI/Hepatic negative GI ROS, Neg liver ROS,   Endo/Other  diabetes, Well Controlled, Type 2  Renal/GU      Musculoskeletal  (+) Arthritis , Osteoarthritis,    Abdominal   Peds  Hematology   Anesthesia Other Findings   Reproductive/Obstetrics                            Anesthesia Physical Anesthesia Plan  ASA: III  Anesthesia Plan: General   Post-op Pain Management:    Induction: Intravenous  PONV Risk Score and Plan: TIVA and Propofol infusion  Airway Management Planned: Nasal Cannula and Natural Airway  Additional Equipment:   Intra-op Plan:   Post-operative Plan:   Informed Consent: I have reviewed the patients History and Physical, chart, labs and discussed the procedure including the risks, benefits and alternatives for the proposed anesthesia with the patient or authorized representative who has indicated his/her understanding and acceptance.     Dental advisory given  Plan Discussed with: Surgeon  Anesthesia Plan Comments:         Anesthesia Quick Evaluation

## 2020-04-23 NOTE — H&P (Signed)
Primary Care Physician:  Sharion Balloon, FNP Primary Gastroenterologist:  Dr. Abbey Chatters  Pre-Procedure History & Physical: HPI:  Gregory Carey is a 60 y.o. male is here for a colonoscopy for colon cancer screening purposes.  Patient denies any family history of colorectal cancer.  No melena or hematochezia.  No abdominal pain or unintentional weight loss.  No change in bowel habits.  Overall feels well from a GI standpoint.  Past Medical History:  Diagnosis Date  . Diabetes mellitus without complication ([place])   . Difficult intubation    Was infomed by MD Anesth his airway was small  . Hyperlipidemia   . Hypertension   . Localized osteoarthritis of left shoulder   . Osteoarthritis of left shoulder 09/29/2018  . Sleep apnea    uses CPAP    Past Surgical History:  Procedure Laterality Date  . KNEE ARTHROSCOPY Right 2008  . SHOULDER ARTHROSCOPY Right 2002  . TOTAL SHOULDER ARTHROPLASTY Left 09/29/2018   Procedure: TOTAL SHOULDER ARTHROPLASTY;  Surgeon: Nicholes Stairs, MD;  Location: [facility];  Service: Orthopedics;  Laterality: Left;  2.5 hrs    Prior to Admission medications   Medication Sig Start Date End Date Taking? Authorizing Provider  amLODipine (NORVASC) 5 MG tablet Take 1 tablet by mouth once daily 04/11/20  Yes Hawks, Christy A, FNP  aspirin EC 81 MG tablet Take 1 tablet (81 mg total) by mouth daily. 08/30/15  Yes Hawks, Christy A, FNP  Dulaglutide (TRULICITY) 1.47 WG/9.5AO SOPN INJECT 0.75MG SUBCUTANEOUSLY ONCE WEEKLY Patient taking differently: Inject 0.75 mg into the muscle every Sunday. INJECT 0.75MG SUBCUTANEOUSLY ONCE WEEKLY 01/30/20  Yes Hawks, Christy A, FNP  lisinopril-hydrochlorothiazide (ZESTORETIC) 20-12.5 MG tablet Take 2 tablets by mouth daily. 01/30/20  Yes Hawks, Christy A, FNP  metFORMIN (GLUCOPHAGE-XR) 750 MG 24 hr tablet Take 1 tablet by mouth once daily with breakfast Patient taking differently: Take 750 mg by mouth daily with breakfast. Take 1 tablet by  mouth once daily with breakfast 04/09/20  Yes Hawks, Christy A, FNP  rosuvastatin (CRESTOR) 20 MG tablet Take 1 tablet (20 mg total) by mouth daily. 01/30/20  Yes Hawks, Theador Hawthorne, FNP  blood glucose meter kit and supplies Dispense based on patient and insurance preference. Use up to four times daily as directed. (FOR ICD-9 250.00, 250.01). 07/29/15   Sharion Balloon, FNP  CONTOUR NEXT TEST test strip USE AS DIRECTED TWICE DAILY 08/04/16   Sharion Balloon, FNP    Allergies as of 03/14/2020  . (No Known Allergies)    Family History  Problem Relation Age of Onset  . CAD Father   . Diabetes Father   . Diabetes Paternal Grandmother   . Colon cancer Neg Hx   . Colon polyps Neg Hx     Social History   Socioeconomic History  . Marital status: Married    Spouse name: Not on file  . Number of children: Not on file  . Years of education: Not on file  . Highest education level: Not on file  Occupational History  . Not on file  Tobacco Use  . Smoking status: Never Smoker  . Smokeless tobacco: Never Used  Vaping Use  . Vaping Use: Never used  Substance and Sexual Activity  . Alcohol use: No  . Drug use: No  . Sexual activity: Yes  Other Topics Concern  . Not on file  Social History Narrative  . Not on file   Social Determinants of Health   Financial  Resource Strain: Not on file  Food Insecurity: Not on file  Transportation Needs: Not on file  Physical Activity: Not on file  Stress: Not on file  Social Connections: Not on file  Intimate Partner Violence: Not on file    Review of Systems: See HPI, otherwise negative ROS  Physical Exam: Vital signs in last 24 hours: Temp:  [98.2 F (36.8 C)] 98.2 F (36.8 C) (03/08 0646) Pulse Rate:  [66] 66 (03/08 0646) Resp:  [12] 12 (03/08 0646) SpO2:  [97 %] 97 % (03/08 0646) Weight:  [124.7 kg] 124.7 kg (03/08 0646)   General:   Alert,  Well-developed, well-nourished, pleasant and cooperative in NAD Head:  Normocephalic and  atraumatic. Eyes:  Sclera clear, no icterus.   Conjunctiva pink. Ears:  Normal auditory acuity. Nose:  No deformity, discharge,  or lesions. Mouth:  No deformity or lesions, dentition normal. Neck:  Supple; no masses or thyromegaly. Lungs:  Clear throughout to auscultation.   No wheezes, crackles, or rhonchi. No acute distress. Heart:  Regular rate and rhythm; no murmurs, clicks, rubs,  or gallops. Abdomen:  Soft, nontender and nondistended. No masses, hepatosplenomegaly or hernias noted. Normal bowel sounds, without guarding, and without rebound.   Msk:  Symmetrical without gross deformities. Normal posture. Extremities:  Without clubbing or edema. Neurologic:  Alert and  oriented x4;  grossly normal neurologically. Skin:  Intact without significant lesions or rashes. Cervical Nodes:  No significant cervical adenopathy. Psych:  Alert and cooperative. Normal mood and affect.  Impression/Plan: Gregory Carey is here for a colonoscopy to be performed for colon cancer screening purposes.  The risks of the procedure including infection, bleed, or perforation as well as benefits, limitations, alternatives and imponderables have been reviewed with the patient. Questions have been answered. All parties agreeable.

## 2020-04-23 NOTE — Discharge Instructions (Addendum)
Colonoscopy Discharge Instructions  Read the instructions outlined below and refer to this sheet in the next few weeks. These discharge instructions provide you with general information on caring for yourself after you leave the hospital. Your doctor may also give you specific instructions. While your treatment has been planned according to the most current medical practices available, unavoidable complications occasionally occur.   ACTIVITY  You may resume your regular activity, but move at a slower pace for the next 24 hours.   Take frequent rest periods for the next 24 hours.   Walking will help get rid of the air and reduce the bloated feeling in your belly (abdomen).   No driving for 24 hours (because of the medicine (anesthesia) used during the test).    Do not sign any important legal documents or operate any machinery for 24 hours (because of the anesthesia used during the test).  NUTRITION  Drink plenty of fluids.   You may resume your normal diet as instructed by your doctor.   Begin with a light meal and progress to your normal diet. Heavy or fried foods are harder to digest and may make you feel sick to your stomach (nauseated).   Avoid alcoholic beverages for 24 hours or as instructed.  MEDICATIONS  You may resume your normal medications unless your doctor tells you otherwise.  WHAT YOU CAN EXPECT TODAY  Some feelings of bloating in the abdomen.   Passage of more gas than usual.   Spotting of blood in your stool or on the toilet paper.  IF YOU HAD POLYPS REMOVED DURING THE COLONOSCOPY:  No aspirin products for 7 days or as instructed.   No alcohol for 7 days or as instructed.   Eat a soft diet for the next 24 hours.  FINDING OUT THE RESULTS OF YOUR TEST Not all test results are available during your visit. If your test results are not back during the visit, make an appointment with your caregiver to find out the results. Do not assume everything is normal if  you have not heard from your caregiver or the medical facility. It is important for you to follow up on all of your test results.  SEEK IMMEDIATE MEDICAL ATTENTION IF:  You have more than a spotting of blood in your stool.   Your belly is swollen (abdominal distention).   You are nauseated or vomiting.   You have a temperature over 101.   You have abdominal pain or discomfort that is severe or gets worse throughout the day.   Your colonoscopy revealed 3 polyp(s) which I removed successfully. Await pathology results, my office will contact you. I recommend repeating colonoscopy in 3-5 years for surveillance purposes depending on pathology results. You also have diverticulosis and internal hemorrhoids. I would recommend increasing fiber in your diet or adding OTC Benefiber/Metamucil. Be sure to drink at least 4 to 6 glasses of water daily. Follow-up with GI as needed.  I hope you have a great rest of your week!  Elon Alas. Abbey Chatters, D.O. [facility]   Hemorrhoids Hemorrhoids are swollen veins in and around the rectum or anus. There are two types of hemorrhoids:  Internal hemorrhoids. These occur in the veins that are just inside the rectum. They may poke through to the outside and become irritated and painful.  External hemorrhoids. These occur in the veins that are outside the anus and can be felt as a painful swelling or hard lump near the anus.  Most hemorrhoids do not cause serious problems, and they can be managed with home treatments such as diet and lifestyle changes. If home treatments do not help the symptoms, procedures can be done to shrink or remove the hemorrhoids. What are the causes? This condition is caused by increased pressure in the anal area. This pressure may result from various things, including:  Constipation.  Straining to have a bowel movement.  Diarrhea.  Pregnancy.  Obesity.  Sitting for long  periods of time.  Heavy lifting or other activity that causes you to strain.  Anal sex.  Riding a bike for a long period of time. What are the signs or symptoms? Symptoms of this condition include:  Pain.  Anal itching or irritation.  Rectal bleeding.  Leakage of stool (feces).  Anal swelling.  One or more lumps around the anus. How is this diagnosed? This condition can often be diagnosed through a visual exam. Other exams or tests may also be done, such as:  An exam that involves feeling the rectal area with a gloved hand (digital rectal exam).  An exam of the anal canal that is done using a small tube (anoscope).  A blood test, if you have lost a significant amount of blood.  A test to look inside the colon using a flexible tube with a camera on the end (sigmoidoscopy or colonoscopy). How is this treated? This condition can usually be treated at home. However, various procedures may be done if dietary changes, lifestyle changes, and other home treatments do not help your symptoms. These procedures can help make the hemorrhoids smaller or remove them completely. Some of these procedures involve surgery, and others do not. Common procedures include:  Rubber band ligation. Rubber bands are placed at the base of the hemorrhoids to cut off their blood supply.  Sclerotherapy. Medicine is injected into the hemorrhoids to shrink them.  Infrared coagulation. A type of light energy is used to get rid of the hemorrhoids.  Hemorrhoidectomy surgery. The hemorrhoids are surgically removed, and the veins that supply them are tied off.  Stapled hemorrhoidopexy surgery. The surgeon staples the base of the hemorrhoid to the rectal wall. Follow these instructions at home: Eating and drinking  Eat foods that have a lot of fiber in them, such as whole grains, beans, nuts, fruits, and vegetables.  Ask your health care provider about taking products that have added fiber (fiber  supplements).  Reduce the amount of fat in your diet. You can do this by eating low-fat dairy products, eating less red meat, and avoiding processed foods.  Drink enough fluid to keep your urine pale yellow.   Managing pain and swelling  Take warm sitz baths for 20 minutes, 3-4 times a day to ease pain and discomfort. You may do this in a bathtub or using a portable sitz bath that fits over the toilet.  If directed, apply ice to the affected area. Using ice packs between sitz baths may be helpful. ? Put ice in a plastic bag. ? Place a towel between your skin and the bag. ? Leave the ice on for 20 minutes, 2-3 times a day.   General instructions  Take over-the-counter and prescription medicines only as told by your health care provider.  Use medicated creams or suppositories as told.  Get regular exercise. Ask your health care provider how much and what kind of exercise is best for you. In general, you should do moderate exercise for at least 30 minutes on most  days of the week (150 minutes each week). This can include activities such as walking, biking, or yoga.  Go to the bathroom when you have the urge to have a bowel movement. Do not wait.  Avoid straining to have bowel movements.  Keep the anal area dry and clean. Use wet toilet paper or moist towelettes after a bowel movement.  Do not sit on the toilet for long periods of time. This increases blood pooling and pain.  Keep all follow-up visits as told by your health care provider. This is important. Contact a health care provider if you have:  Increasing pain and swelling that are not controlled by treatment or medicine.  Difficulty having a bowel movement, or you are unable to have a bowel movement.  Pain or inflammation outside the area of the hemorrhoids. Get help right away if you have:  Uncontrolled bleeding from your rectum. Summary  Hemorrhoids are swollen veins in and around the rectum or anus.  Most  hemorrhoids can be managed with home treatments such as diet and lifestyle changes.  Taking warm sitz baths can help ease pain and discomfort.  In severe cases, procedures or surgery can be done to shrink or remove the hemorrhoids. This information is not intended to replace advice given to you by your health care provider. Make sure you discuss any questions you have with your health care provider. Document Revised: 07/01/2018 Document Reviewed: 06/24/2017 Elsevier Patient Education  2021 [place].  Diverticulosis  Diverticulosis is a condition that develops when small pouches (diverticula) form in the wall of the large intestine (colon). The colon is where water is absorbed and stool (feces) is formed. The pouches form when the inside layer of the colon pushes through weak spots in the outer layers of the colon. You may have a few pouches or many of them. The pouches usually do not cause problems unless they become inflamed or infected. When this happens, the condition is called diverticulitis. What are the causes? The cause of this condition is not known. What increases the risk? The following factors may make you more likely to develop this condition:  Being older than age 62. Your risk for this condition increases with age. Diverticulosis is rare among people younger than age 95. By age 30, many people have it.  Eating a low-fiber diet.  Having frequent constipation.  Being overweight.  Not getting enough exercise.  Smoking.  Taking over-the-counter pain medicines, like aspirin and ibuprofen.  Having a family history of diverticulosis. What are the signs or symptoms? In most people, there are no symptoms of this condition. If you do have symptoms, they may include:  Bloating.  Cramps in the abdomen.  Constipation or diarrhea.  Pain in the lower left side of the abdomen. How is this diagnosed? Because diverticulosis usually has no symptoms, it is most often  diagnosed during an exam for other colon problems. The condition may be diagnosed by:  Using a flexible scope to examine the colon (colonoscopy).  Taking an X-ray of the colon after dye has been put into the colon (barium enema).  Having a CT scan. How is this treated? You may not need treatment for this condition. Your health care provider may recommend treatment to prevent problems. You may need treatment if you have symptoms or if you previously had diverticulitis. Treatment may include:  Eating a high-fiber diet.  Taking a fiber supplement.  Taking a live bacteria supplement (probiotic).  Taking medicine to relax your colon.  Follow these instructions at home: Medicines  Take over-the-counter and prescription medicines only as told by your health care provider.  If told by your health care provider, take a fiber supplement or probiotic. Constipation prevention Your condition may cause constipation. To prevent or treat constipation, you may need to:  Drink enough fluid to keep your urine pale yellow.  Take over-the-counter or prescription medicines.  Eat foods that are high in fiber, such as beans, whole grains, and fresh fruits and vegetables.  Limit foods that are high in fat and processed sugars, such as fried or sweet foods.   General instructions  Try not to strain when you have a bowel movement.  Keep all follow-up visits as told by your health care provider. This is important. Contact a health care provider if you:  Have pain in your abdomen.  Have bloating.  Have cramps.  Have not had a bowel movement in 3 days. Get help right away if:  Your pain gets worse.  Your bloating becomes very bad.  You have a fever or chills, and your symptoms suddenly get worse.  You vomit.  You have bowel movements that are bloody or black.  You have bleeding from your rectum. Summary  Diverticulosis is a condition that develops when small pouches (diverticula)  form in the wall of the large intestine (colon).  You may have a few pouches or many of them.  This condition is most often diagnosed during an exam for other colon problems.  Treatment may include increasing the fiber in your diet, taking supplements, or taking medicines. This information is not intended to replace advice given to you by your health care provider. Make sure you discuss any questions you have with your health care provider. Document Revised: 09/01/2018 Document Reviewed: 09/01/2018 Elsevier Patient Education  [place].  Colon Polyps  Colon polyps are tissue growths inside the colon, which is part of the large intestine. They are one of the types of polyps that can grow in the body. A polyp may be a round bump or a mushroom-shaped growth. You could have one polyp or more than one. Most colon polyps are noncancerous (benign). However, some colon polyps can become cancerous over time. Finding and removing the polyps early can help prevent this. What are the causes? The exact cause of colon polyps is not known. What increases the risk? The following factors may make you more likely to develop this condition:  Having a family history of colorectal cancer or colon polyps.  Being older than 59 years of age.  Being younger than 59 years of age and having a significant family history of colorectal cancer or colon polyps or a genetic condition that puts you at higher risk of getting colon polyps.  Having inflammatory bowel disease, such as ulcerative colitis or Crohn's disease.  Having certain conditions passed from parent to child (hereditary conditions), such as: ? Familial adenomatous polyposis (FAP). ? Lynch syndrome. ? Turcot syndrome. ? Peutz-Jeghers syndrome. ? MUTYH-associated polyposis (MAP).  Being overweight.  Certain lifestyle factors. These include smoking cigarettes, drinking too much alcohol, not getting enough exercise, and eating a diet that is  high in fat and red meat and low in fiber.  Having had childhood cancer that was treated with radiation of the abdomen. What are the signs or symptoms? Many times, there are no symptoms. If you have symptoms, they may include:  Blood coming from the rectum during a bowel movement.  Blood in the stool (feces).  The blood may be bright red or very dark in color.  Pain in the abdomen.  A change in bowel habits, such as constipation or diarrhea. How is this diagnosed? This condition is diagnosed with a colonoscopy. This is a procedure in which a lighted, flexible scope is inserted into the opening between the buttocks (anus) and then passed into the colon to examine the area. Polyps are sometimes found when a colonoscopy is done as part of routine cancer screening tests. How is this treated? This condition is treated by removing any polyps that are found. Most polyps can be removed during a colonoscopy. Those polyps will then be tested for cancer. Additional treatment may be needed depending on the results of testing. Follow these instructions at home: Eating and drinking  Eat foods that are high in fiber, such as fruits, vegetables, and whole grains.  Eat foods that are high in calcium and vitamin D, such as milk, cheese, yogurt, eggs, liver, fish, and broccoli.  Limit foods that are high in fat, such as fried foods and desserts.  Limit the amount of red meat, precooked or cured meat, or other processed meat that you eat, such as hot dogs, sausages, bacon, or meat loaves.  Limit sugary drinks.   Lifestyle  Maintain a healthy weight, or lose weight if recommended by your health care provider.  Exercise every day or as told by your health care provider.  Do not use any products that contain nicotine or tobacco, such as cigarettes, e-cigarettes, and chewing tobacco. If you need help quitting, ask your health care provider.  Do not drink alcohol if: ? Your health care provider tells  you not to drink. ? You are pregnant, may be pregnant, or are planning to become pregnant.  If you drink alcohol: ? Limit how much you use to:  0-1 drink a day for women.  0-2 drinks a day for men. ? Know how much alcohol is in your drink. In the [place] one drink equals one 12 oz bottle of beer (355 mL), one 5 oz glass of wine (148 mL), or one 1 oz glass of hard liquor (44 mL). General instructions  Take over-the-counter and prescription medicines only as told by your health care provider.  Keep all follow-up visits. This is important. This includes having regularly scheduled colonoscopies. Talk to your health care provider about when you need a colonoscopy. Contact a health care provider if:  You have new or worsening bleeding during a bowel movement.  You have new or increased blood in your stool.  You have a change in bowel habits.  You lose weight for no known reason. Summary  Colon polyps are tissue growths inside the colon, which is part of the large intestine. They are one type of polyp that can grow in the body.  Most colon polyps are noncancerous (benign), but some can become cancerous over time.  This condition is diagnosed with a colonoscopy.  This condition is treated by removing any polyps that are found. Most polyps can be removed during a colonoscopy. This information is not intended to replace advice given to you by your health care provider. Make sure you discuss any questions you have with your health care provider. Document Revised: 05/24/2019 Document Reviewed: 05/24/2019 Elsevier Patient Education  2021 [employer].

## 2020-04-23 NOTE — Op Note (Signed)
[facility] Patient Name: Gregory Carey Procedure Date: 04/23/2020 7:06 AM MRN: [id number] Date of Birth: [birth year] Attending MD: Elon Alas. Edgar Frisk CSN: [id number] Age: 60 Admit Type: Outpatient Procedure:                Colonoscopy Indications:              Screening for colorectal malignant neoplasm Providers:                Elon Alas. Abbey Chatters, DO, Lambert Mody, Aram Candela Referring MD:              Medicines:                See the Anesthesia note for documentation of the                            administered medications Complications:            No immediate complications. Estimated Blood Loss:     Estimated blood loss was minimal. Procedure:                Pre-Anesthesia Assessment:                           - The anesthesia plan was to use monitored                            anesthesia care (MAC).                           After obtaining informed consent, the colonoscope                            was passed under direct vision. Throughout the                            procedure, the patient's blood pressure, pulse, and                            oxygen saturations were monitored continuously. The                            [id number]-HQ190L ([id number]) scope was introduced through                            the anus and advanced to the the cecum, identified                            by appendiceal orifice and ileocecal valve. The                            colonoscopy was performed without difficulty. The                            patient tolerated the procedure well. The quality  of the bowel preparation was evaluated using the                            BBPS [facility] Bowel Preparation Scale) with scores                            of: Right Colon = 2 (minor amount of residual                            staining, small fragments of stool and/or opaque                            liquid, but mucosa seen well),  Transverse Colon = 2                            (minor amount of residual staining, small fragments                            of stool and/or opaque liquid, but mucosa seen                            well) and Left Colon = 2 (minor amount of residual                            staining, small fragments of stool and/or opaque                            liquid, but mucosa seen well). The total BBPS score                            equals 6. The quality of the bowel preparation was                            fair. Scope In: 7:33:57 AM Scope Out: 8:00:13 AM Scope Withdrawal Time: 0 hours 15 minutes 48 seconds  Total Procedure Duration: 0 hours 26 minutes 16 seconds  Findings:      The perianal and digital rectal examinations were normal.      Non-bleeding internal hemorrhoids were found during endoscopy.      Many small and large-mouthed diverticula were found in the sigmoid colon       and descending colon.      A 10 mm polyp was found in the descending colon. The polyp was sessile.       The polyp was removed with a cold snare. Resection and retrieval were       complete. To stop active bleeding, one hemostatic clip was successfully       placed (MR conditional). There was no bleeding at the end of the       procedure.      Two sessile polyps were found in the descending colon. The polyps were 4       to 6 mm in size. These polyps were removed with a cold snare. Resection       and retrieval were complete.      An  area of nodular mucosa was found at the ileocecal valve. Biopsies       were taken with a cold forceps for histology. Impression:               - Preparation of the colon was fair.                           - Non-bleeding internal hemorrhoids.                           - Diverticulosis in the sigmoid colon and in the                            descending colon.                           - One 10 mm polyp in the descending colon, removed                            with a cold  snare. Resected and retrieved. Clip (MR                            conditional) was placed.                           - Two 4 to 6 mm polyps in the descending colon,                            removed with a cold snare. Resected and retrieved.                           - Nodular mucosa at the ileocecal valve. Biopsied. Moderate Sedation:      Per Anesthesia Care Recommendation:           - Patient has a contact number available for                            emergencies. The signs and symptoms of potential                            delayed complications were discussed with the                            patient. Return to normal activities tomorrow.                            Written discharge instructions were provided to the                            patient.                           - Resume previous diet.                           - Continue present  medications.                           - Await pathology results.                           - Repeat colonoscopy in 5 years for surveillance.                           - No MRI for 4-6 weeks, need KUB prior Procedure Code(s):        --- Professional ---                           [phone], Colonoscopy, flexible; with removal of                            tumor(s), polyp(s), or other lesion(s) by snare                            technique Diagnosis Code(s):        --- Professional ---                           K63.5, Polyp of colon                           Z12.11, Encounter for screening for malignant                            neoplasm of colon                           K64.8, Other hemorrhoids                           K57.30, Diverticulosis of large intestine without                            perforation or abscess without bleeding CPT copyright 2019 American Medical Association. All rights reserved. The codes documented in this report are preliminary and upon coder review may  be revised to meet current compliance  requirements. Elon Alas. Abbey Chatters, DO [place] Abbey Chatters, DO 04/23/2020 8:06:32 AM This report has been signed electronically. Number of Addenda: 0

## 2020-04-24 LAB — SURGICAL PATHOLOGY

## 2020-04-26 ENCOUNTER — Encounter (HOSPITAL_COMMUNITY): Payer: Self-pay | Admitting: Internal Medicine

## 2020-06-28 ENCOUNTER — Other Ambulatory Visit: Payer: Self-pay | Admitting: Family

## 2020-06-28 DIAGNOSIS — I10 Essential (primary) hypertension: Secondary | ICD-10-CM

## 2020-07-08 ENCOUNTER — Other Ambulatory Visit: Payer: Self-pay | Admitting: Family

## 2020-07-08 DIAGNOSIS — I10 Essential (primary) hypertension: Secondary | ICD-10-CM

## 2020-07-08 DIAGNOSIS — E1165 Type 2 diabetes mellitus with hyperglycemia: Secondary | ICD-10-CM

## 2020-07-08 DIAGNOSIS — Z794 Long term (current) use of insulin: Secondary | ICD-10-CM

## 2020-07-16 ENCOUNTER — Ambulatory Visit (INDEPENDENT_AMBULATORY_CARE_PROVIDER_SITE_OTHER): Payer: BC Managed Care – PPO | Admitting: Physician Assistant

## 2020-07-16 ENCOUNTER — Other Ambulatory Visit: Payer: Self-pay

## 2020-07-16 ENCOUNTER — Encounter: Payer: Self-pay | Admitting: Physician Assistant

## 2020-07-16 VITALS — BP 137/81 | HR 72 | Temp 97.8°F | Ht 71.0 in | Wt 272.8 lb

## 2020-07-16 DIAGNOSIS — R591 Generalized enlarged lymph nodes: Secondary | ICD-10-CM | POA: Diagnosis not present

## 2020-07-16 MED ORDER — AMOXICILLIN 875 MG PO TABS: 875.0000 mg | ORAL_TABLET | Freq: Two times a day (BID) | ORAL | 0 refills | Status: AC

## 2020-07-16 NOTE — Patient Instructions (Signed)
Lymphadenopathy  Lymphadenopathy means that your lymph glands are swollen or larger than normal. Lymph glands, also called lymph nodes, are collections of tissue that filter excess fluid, bacteria, viruses, and waste from your bloodstream. They are part of your body's disease-fighting system (immune system), which protects your body from germs. There may be different causes of lymphadenopathy, depending on where it is in your body. Some types go away on their own. Lymphadenopathy can occur anywhere that you have lymph glands, including these areas:  Neck (cervical lymphadenopathy).  Chest (mediastinal lymphadenopathy).  Lungs (hilar lymphadenopathy).  Underarms (axillary lymphadenopathy).  Groin (inguinal lymphadenopathy). When your immune system responds to germs, infection-fighting cells and fluid build up in your lymph glands. This causes some swelling and enlargement. If the lymph nodes do not go back to normal size after you have an infection or disease, your health care provider may do tests. These tests help to monitor your condition and find the reason why the glands are still swollen and enlarged. Follow these instructions at home:  Get plenty of rest.  Your health care provider may recommend over-the-counter medicines for pain. Take over-the-counter and prescription medicines only as told by your health care provider.  If directed, apply heat to swollen lymph glands as often as told by your health care provider. Use the heat source that your health care provider recommends, such as a moist heat pack or a heating pad. ? Place a towel between your skin and the heat source. ? Leave the heat on for 20-30 minutes. ? Remove the heat if your skin turns bright red. This is especially important if you are unable to feel pain, heat, or cold. You may have a greater risk of getting burned.  Check your affected lymph glands every day for changes. Check other lymph gland areas as told by your  health care provider. Check for changes such as: ? More swelling. ? Sudden increase in size. ? Redness or pain. ? Hardness.  Keep all follow-up visits. This is important.   Contact a health care provider if you have:  Lymph glands that: ? Are still swollen after 2 weeks. ? Have suddenly gotten bigger or the swelling spreads. ? Are red, painful, or hard.  Fluid leaking from the skin near an enlarged lymph gland.  Problems with breathing.  A fever, chills, or night sweats.  Fatigue.  A sore throat.  Pain in your abdomen.  Weight loss. Get help right away if you have:  Severe pain.  Chest pain.  Shortness of breath. These symptoms may represent a serious problem that is an emergency. Do not wait to see if the symptoms will go away. Get medical help right away. Call your local emergency services (911 in the [place] Do not drive yourself to the hospital. Summary  Lymphadenopathy means that your lymph glands are swollen or larger than normal.  Lymph glands, also called lymph nodes, are collections of tissue that filter excess fluid, bacteria, viruses, and waste from the bloodstream. They are part of your body's disease-fighting system (immune system).  Lymphadenopathy can occur anywhere that you have lymph glands.  If the lymph nodes do not go back to normal size after you have an infection or disease, your health care provider may do tests to monitor your condition and find the reason why the glands are still swollen and enlarged.  Check your affected lymph glands every day for changes. Check other lymph gland areas as told by your health care provider. This information   is not intended to replace advice given to you by your health care provider. Make sure you discuss any questions you have with your health care provider. Document Revised: 11/29/2019 Document Reviewed: 11/29/2019 Elsevier Patient Education  2021 [employer]  

## 2020-07-16 NOTE — Progress Notes (Signed)
  Subjective:     Patient ID: Gregory Carey, male   DOB: 10-28-1960, 60 y.o.   MRN: [id number]  HPI  Pt with a 2 week hx of knot to the L side of the throat Denies any pain to the site No hx of same Denies any ear pain, jaw pain, or S/T He did have some dental work done in Feb No hx of chewing tob, smoking, or dipping snuff Review of Systems  Constitutional: Negative.   HENT: Negative.   Eyes: Negative.   Respiratory: Negative.   Cardiovascular: Negative.        Objective:   Physical Exam Vitals and nursing note reviewed.  Constitutional:      General: He is not in acute distress.    Appearance: Normal appearance. He is not ill-appearing.  HENT:     Right Ear: Tympanic membrane, ear canal and external ear normal.     Left Ear: Tympanic membrane, ear canal and external ear normal.     Mouth/Throat:     Mouth: Mucous membranes are moist.     Pharynx: Oropharynx is clear.     Comments: No lesion noted No increase in tonsil size No caries noted No TTP of teeth gums Neck:     Comments: Non tender L ant cerv node palp No other nodes palp Musculoskeletal:     Cervical back: Neck supple. No rigidity or tenderness.  Lymphadenopathy:     Cervical: Cervical adenopathy present.  Neurological:     Mental Status: He is alert.        Assessment:     Lymphadenopathy    Plan:     Amox rx Keep f/u appt in 2 weeks If sx not improved in 2 weeks refer to ENT [facility]

## 2020-07-30 ENCOUNTER — Other Ambulatory Visit: Payer: Self-pay

## 2020-07-30 ENCOUNTER — Ambulatory Visit (INDEPENDENT_AMBULATORY_CARE_PROVIDER_SITE_OTHER): Payer: BC Managed Care – PPO | Admitting: Family

## 2020-07-30 ENCOUNTER — Encounter: Payer: Self-pay | Admitting: Family

## 2020-07-30 VITALS — BP 142/73 | HR 67 | Temp 96.9°F | Ht 71.0 in | Wt 271.8 lb

## 2020-07-30 DIAGNOSIS — I10 Essential (primary) hypertension: Secondary | ICD-10-CM

## 2020-07-30 DIAGNOSIS — Z794 Long term (current) use of insulin: Secondary | ICD-10-CM | POA: Diagnosis not present

## 2020-07-30 DIAGNOSIS — E785 Hyperlipidemia, unspecified: Secondary | ICD-10-CM

## 2020-07-30 DIAGNOSIS — G4733 Obstructive sleep apnea (adult) (pediatric): Secondary | ICD-10-CM

## 2020-07-30 DIAGNOSIS — M1711 Unilateral primary osteoarthritis, right knee: Secondary | ICD-10-CM

## 2020-07-30 DIAGNOSIS — E1165 Type 2 diabetes mellitus with hyperglycemia: Secondary | ICD-10-CM | POA: Diagnosis not present

## 2020-07-30 DIAGNOSIS — M19012 Primary osteoarthritis, left shoulder: Secondary | ICD-10-CM

## 2020-07-30 DIAGNOSIS — R591 Generalized enlarged lymph nodes: Secondary | ICD-10-CM

## 2020-07-30 MED ORDER — ROSUVASTATIN CALCIUM 20 MG PO TABS: 20.0000 mg | ORAL_TABLET | Freq: Every day | ORAL | 3 refills | Status: DC

## 2020-07-30 MED ORDER — LISINOPRIL-HYDROCHLOROTHIAZIDE 20-12.5 MG PO TABS: 2.0000 | ORAL_TABLET | Freq: Every day | ORAL | 2 refills | Status: DC

## 2020-07-30 MED ORDER — METFORMIN HCL ER 750 MG PO TB24
750.0000 mg | ORAL_TABLET | Freq: Every day | ORAL | 0 refills | Status: DC
Start: 2020-07-30 — End: 2020-12-17

## 2020-07-30 MED ORDER — AMLODIPINE BESYLATE 5 MG PO TABS: 1.0000 | ORAL_TABLET | Freq: Every day | ORAL | 3 refills | Status: DC

## 2020-07-30 NOTE — Progress Notes (Signed)
Subjective:    Patient ID: Gregory Carey, male    DOB: 1960/05/01, 60 y.o.   MRN: [id number]  Chief Complaint  Patient presents with   Medical Management of Chronic Issues   Cyst    On neck    PT presents to the office today for chronic follow up. He is followed by Ortho and is planning right knee surgery in the future.   He reports he has an enlarged left lymph node of his neck. He was seen on 07/16/20 and given Augmentin. He reports the area has become smaller. Denies any fever, pain in neck.  Diabetes He presents for his follow-up diabetic visit. He has type 2 diabetes mellitus. There are no hypoglycemic associated symptoms. Pertinent negatives for diabetes include no blurred vision and no foot paresthesias. Symptoms are stable. Pertinent negatives for diabetic complications include no heart disease, peripheral neuropathy or PVD. Risk factors for coronary artery disease include dyslipidemia, diabetes mellitus, male sex, hypertension and sedentary lifestyle. He is following a generally healthy diet. His overall blood glucose range is 110-130 mg/dl. An ACE inhibitor/angiotensin II receptor blocker is being taken.  Hypertension This is a chronic problem. The current episode started more than 1 year ago. The problem has been resolved since onset. The problem is controlled. Pertinent negatives include no blurred vision, malaise/fatigue, peripheral edema or shortness of breath. Risk factors for coronary artery disease include dyslipidemia, obesity and male gender. The current treatment provides moderate improvement. There is no history of heart failure or PVD.  Arthritis Presents for follow-up visit. He complains of pain and stiffness. The symptoms have been stable. Affected locations include the left knee, right knee, left hip, right hip, left shoulder and right shoulder. His pain is at a severity of 7/10.  Hyperlipidemia This is a chronic problem. The current episode started more than 1 year  ago. Exacerbating diseases include obesity. Pertinent negatives include no shortness of breath. Current antihyperlipidemic treatment includes statins. The current treatment provides moderate improvement of lipids. Risk factors for coronary artery disease include dyslipidemia, diabetes mellitus, male sex, hypertension and a sedentary lifestyle.   OSA Uses CPAP nightly. Stable.   Review of Systems  Constitutional:  Negative for malaise/fatigue.  Eyes:  Negative for blurred vision.  Respiratory:  Negative for shortness of breath.   Musculoskeletal:  Positive for arthritis and stiffness.  All other systems reviewed and are negative.     Objective:   Physical Exam Vitals reviewed.  Constitutional:      General: He is not in acute distress.    Appearance: He is well-developed.  HENT:     Head: Normocephalic.     Right Ear: Tympanic membrane normal.     Left Ear: Tympanic membrane normal.  Eyes:     General:        Right eye: No discharge.        Left eye: No discharge.     Pupils: Pupils are equal, round, and reactive to light.  Neck:     Thyroid: No thyromegaly.   Cardiovascular:     Rate and Rhythm: Normal rate and regular rhythm.     Heart sounds: Normal heart sounds. No murmur heard. Pulmonary:     Effort: Pulmonary effort is normal. No respiratory distress.     Breath sounds: Normal breath sounds. No wheezing.  Abdominal:     General: Bowel sounds are normal. There is no distension.     Palpations: Abdomen is soft.     Tenderness:  There is no abdominal tenderness.  Musculoskeletal:        General: No tenderness. Normal range of motion.     Cervical back: Normal range of motion and neck supple.  Lymphadenopathy:     Cervical: Cervical adenopathy (4X4 cm on left neck) present.  Skin:    General: Skin is warm and dry.     Findings: No erythema or rash.  Neurological:     Mental Status: He is alert and oriented to person, place, and time.     Cranial Nerves: No cranial  nerve deficit.     Deep Tendon Reflexes: Reflexes are normal and symmetric.  Psychiatric:        Behavior: Behavior normal.        Thought Content: Thought content normal.        Judgment: Judgment normal.      BP (!) 142/73   Pulse 67   Temp (!) 96.9 F (36.1 C) (Temporal)   Ht 5' 11"  (1.803 m)   Wt 271 lb 12.8 oz (123.3 kg)   BMI 37.91 kg/m      Assessment & Plan:  Gregory Carey comes in today with chief complaint of Medical Management of Chronic Issues and Cyst (On neck )   Diagnosis and orders addressed:  1. Essential hypertension - amLODipine (NORVASC) 5 MG tablet; Take 1 tablet (5 mg total) by mouth daily.  Dispense: 90 tablet; Refill: 3 - lisinopril-hydrochlorothiazide (ZESTORETIC) 20-12.5 MG tablet; Take 2 tablets by mouth daily.  Dispense: 180 tablet; Refill: 2 - CBC with Differential/Platelet - CMP14+EGFR  2. Type 2 diabetes mellitus with hyperglycemia, with long-term current use of insulin (HCC) - metFORMIN (GLUCOPHAGE-XR) 750 MG 24 hr tablet; Take 1 tablet (750 mg total) by mouth daily with breakfast.  Dispense: 90 tablet; Refill: 0 - CBC with Differential/Platelet - CMP14+EGFR  3. Hyperlipidemia, unspecified hyperlipidemia type - rosuvastatin (CRESTOR) 20 MG tablet; Take 1 tablet (20 mg total) by mouth daily.  Dispense: 90 tablet; Refill: 3 - CBC with Differential/Platelet - CMP14+EGFR  4. OSA (obstructive sleep apnea) - CBC with Differential/Platelet - CMP14+EGFR  5. Primary osteoarthritis of right knee - CBC with Differential/Platelet - CMP14+EGFR  6. Osteoarthritis of left shoulder, unspecified osteoarthritis type - CBC with Differential/Platelet - CMP14+EGFR  7. Morbid obesity (HCC) - CBC with Differential/Platelet - CMP14+EGFR  8. Lymphadenopathy of head and neck US pending - CBC with Differential/Platelet - CMP14+EGFR - US Soft Tissue Head/Neck (NON-THYROID); Future   Labs pending Health Maintenance reviewed Diet and exercise  encouraged  Follow up plan: 6 months   Evelina Dun, FNP

## 2020-07-30 NOTE — Patient Instructions (Signed)
Lymphadenopathy  Lymphadenopathy means that your lymph glands are swollen or larger than normal. Lymph glands, also called lymph nodes, are collections of tissue that filter excess fluid, bacteria, viruses, and waste from your bloodstream. They are part of your body's disease-fighting system (immune system), which protects your body from germs. There may be different causes of lymphadenopathy, depending on where it is in your body. Some types go away on their own. Lymphadenopathy can occur anywhere that you have lymph glands, including these areas: Neck (cervical lymphadenopathy). Chest (mediastinal lymphadenopathy). Lungs (hilar lymphadenopathy). Underarms (axillary lymphadenopathy). Groin (inguinal lymphadenopathy). When your immune system responds to germs, infection-fighting cells and fluid build up in your lymph glands. This causes some swelling and enlargement. If the lymph nodes do not go back to normal size after you have an infection or disease, your health care provider may do tests. These tests help to monitor your condition and find the reason why the glands are still swollen andenlarged. Follow these instructions at home:  Get plenty of rest. Your health care provider may recommend over-the-counter medicines for pain. Take over-the-counter and prescription medicines only as told by your health care provider. If directed, apply heat to swollen lymph glands as often as told by your health care provider. Use the heat source that your health care provider recommends, such as a moist heat pack or a heating pad. Place a towel between your skin and the heat source. Leave the heat on for 20-30 minutes. Remove the heat if your skin turns bright red. This is especially important if you are unable to feel pain, heat, or cold. You may have a greater risk of getting burned. Check your affected lymph glands every day for changes. Check other lymph gland areas as told by your health care provider.  Check for changes such as: More swelling. Sudden increase in size. Redness or pain. Hardness. Keep all follow-up visits. This is important. Contact a health care provider if you have: Lymph glands that: Are still swollen after 2 weeks. Have suddenly gotten bigger or the swelling spreads. Are red, painful, or hard. Fluid leaking from the skin near an enlarged lymph gland. Problems with breathing. A fever, chills, or night sweats. Fatigue. A sore throat. Pain in your abdomen. Weight loss. Get help right away if you have: Severe pain. Chest pain. Shortness of breath. These symptoms may represent a serious problem that is an emergency. Do not wait to see if the symptoms will go away. Get medical help right away. Call your local emergency services (911 in the [place] Do not drive yourself to the hospital. Summary Lymphadenopathy means that your lymph glands are swollen or larger than normal. Lymph glands, also called lymph nodes, are collections of tissue that filter excess fluid, bacteria, viruses, and waste from the bloodstream. They are part of your body's disease-fighting system (immune system). Lymphadenopathy can occur anywhere that you have lymph glands. If the lymph nodes do not go back to normal size after you have an infection or disease, your health care provider may do tests to monitor your condition and find the reason why the glands are still swollen and enlarged. Check your affected lymph glands every day for changes. Check other lymph gland areas as told by your health care provider. This information is not intended to replace advice given to you by your health care provider. Make sure you discuss any questions you have with your healthcare provider. Document Revised: 11/29/2019 Document Reviewed: 11/29/2019 Elsevier Patient Education  2022 [employer]  

## 2020-07-31 LAB — CBC WITH DIFFERENTIAL/PLATELET
Basophils Absolute: 0.1 10*3/uL (ref 0.0–0.2)
Basos: 1 %
EOS (ABSOLUTE): 0.3 10*3/uL (ref 0.0–0.4)
Eos: 3 %
Hematocrit: 49.7 % (ref 37.5–51.0)
Hemoglobin: 15.9 g/dL (ref 13.0–17.7)
Immature Grans (Abs): 0 10*3/uL (ref 0.0–0.1)
Immature Granulocytes: 0 %
Lymphocytes Absolute: 2.2 10*3/uL (ref 0.7–3.1)
Lymphs: 26 %
MCH: 28.1 pg (ref 26.6–33.0)
MCHC: 32 g/dL (ref 31.5–35.7)
MCV: 88 fL (ref 79–97)
Monocytes Absolute: 0.6 10*3/uL (ref 0.1–0.9)
Monocytes: 7 %
Neutrophils Absolute: 5.5 10*3/uL (ref 1.4–7.0)
Neutrophils: 63 %
Platelets: 280 10*3/uL (ref 150–450)
RBC: 5.66 x10E6/uL (ref 4.14–5.80)
RDW: 12.8 % (ref 11.6–15.4)
WBC: 8.7 10*3/uL (ref 3.4–10.8)

## 2020-07-31 LAB — CMP14+EGFR
ALT: 42 IU/L (ref 0–44)
AST: 27 IU/L (ref 0–40)
Albumin/Globulin Ratio: 2.2 (ref 1.2–2.2)
Albumin: 4.8 g/dL (ref 3.8–4.9)
Alkaline Phosphatase: 66 IU/L (ref 44–121)
BUN/Creatinine Ratio: 13 (ref 9–20)
BUN: 13 mg/dL (ref 6–24)
Bilirubin Total: 0.9 mg/dL (ref 0.0–1.2)
CO2: 24 mmol/L (ref 20–29)
Calcium: 10.1 mg/dL (ref 8.7–10.2)
Chloride: 101 mmol/L (ref 96–106)
Creatinine, Ser: 1.04 mg/dL (ref 0.76–1.27)
Globulin, Total: 2.2 g/dL (ref 1.5–4.5)
Glucose: 91 mg/dL (ref 65–99)
Potassium: 4.4 mmol/L (ref 3.5–5.2)
Sodium: 143 mmol/L (ref 134–144)
Total Protein: 7 g/dL (ref 6.0–8.5)
eGFR: 83 mL/min/{1.73_m2} (ref >59)

## 2020-08-06 ENCOUNTER — Ambulatory Visit (HOSPITAL_COMMUNITY)
Admission: RE | Admit: 2020-08-06 | Discharge: 2020-08-06 | Disposition: A | Discharge status: Home / Self Care | Payer: BC Managed Care – PPO | Source: Ambulatory Visit | Attending: Family | Admitting: Family

## 2020-08-06 ENCOUNTER — Other Ambulatory Visit: Payer: Self-pay | Admitting: Family

## 2020-08-06 DIAGNOSIS — R591 Generalized enlarged lymph nodes: Secondary | ICD-10-CM | POA: Diagnosis not present

## 2020-08-06 DIAGNOSIS — L570 Actinic keratosis: Secondary | ICD-10-CM | POA: Diagnosis not present

## 2020-08-06 DIAGNOSIS — L905 Scar conditions and fibrosis of skin: Secondary | ICD-10-CM | POA: Diagnosis not present

## 2020-08-06 DIAGNOSIS — I8393 Asymptomatic varicose veins of bilateral lower extremities: Secondary | ICD-10-CM | POA: Diagnosis not present

## 2020-08-06 DIAGNOSIS — R590 Localized enlarged lymph nodes: Secondary | ICD-10-CM | POA: Diagnosis not present

## 2020-08-06 DIAGNOSIS — D229 Melanocytic nevi, unspecified: Secondary | ICD-10-CM | POA: Diagnosis not present

## 2020-08-06 DIAGNOSIS — L819 Disorder of pigmentation, unspecified: Secondary | ICD-10-CM | POA: Diagnosis not present

## 2020-08-15 ENCOUNTER — Ambulatory Visit (HOSPITAL_COMMUNITY): Payer: BC Managed Care – PPO | Admitting: Hematology and Oncology

## 2020-08-16 ENCOUNTER — Other Ambulatory Visit: Payer: Self-pay | Admitting: Family

## 2020-08-22 ENCOUNTER — Encounter (HOSPITAL_COMMUNITY): Payer: Self-pay | Admitting: *Deleted

## 2020-08-23 ENCOUNTER — Inpatient Hospital Stay (HOSPITAL_COMMUNITY): Payer: BC Managed Care – PPO | Attending: Hematology and Oncology | Admitting: Hematology and Oncology

## 2020-08-23 ENCOUNTER — Encounter (HOSPITAL_COMMUNITY): Payer: Self-pay | Admitting: Lab

## 2020-08-23 ENCOUNTER — Other Ambulatory Visit: Payer: Self-pay

## 2020-08-23 ENCOUNTER — Inpatient Hospital Stay (HOSPITAL_COMMUNITY): Payer: BC Managed Care – PPO

## 2020-08-23 ENCOUNTER — Encounter (HOSPITAL_COMMUNITY): Payer: Self-pay | Admitting: Hematology and Oncology

## 2020-08-23 VITALS — BP 154/82 | HR 68 | Temp 98.4°F | Resp 18 | Ht 71.0 in | Wt 275.8 lb

## 2020-08-23 DIAGNOSIS — R590 Localized enlarged lymph nodes: Secondary | ICD-10-CM

## 2020-08-23 LAB — CBC WITH DIFFERENTIAL/PLATELET
Abs Immature Granulocytes: 0.01 10*3/uL (ref 0.00–0.07)
Basophils Absolute: 0.1 10*3/uL (ref 0.0–0.1)
Basophils Relative: 1 %
Eosinophils Absolute: 0.3 10*3/uL (ref 0.0–0.5)
Eosinophils Relative: 4 %
HCT: 45.2 % (ref 39.0–52.0)
Hemoglobin: 15.4 g/dL (ref 13.0–17.0)
Immature Granulocytes: 0 %
Lymphocytes Relative: 28 %
Lymphs Abs: 2 10*3/uL (ref 0.7–4.0)
MCH: 29.9 pg (ref 26.0–34.0)
MCHC: 34.1 g/dL (ref 30.0–36.0)
MCV: 87.8 fL (ref 80.0–100.0)
Monocytes Absolute: 0.5 10*3/uL (ref 0.1–1.0)
Monocytes Relative: 7 %
Neutro Abs: 4.3 10*3/uL (ref 1.7–7.7)
Neutrophils Relative %: 60 %
Platelets: 249 10*3/uL (ref 150–400)
RBC: 5.15 MIL/uL (ref 4.22–5.81)
RDW: 12.6 % (ref 11.5–15.5)
WBC: 7.2 10*3/uL (ref 4.0–10.5)
nRBC: 0 % (ref 0.0–0.2)

## 2020-08-23 LAB — CMP (CANCER CENTER ONLY)
ALT: 50 U/L — ABNORMAL HIGH (ref 0–44)
AST: 33 U/L (ref 15–41)
Albumin: 4.2 g/dL (ref 3.5–5.0)
Alkaline Phosphatase: 50 U/L (ref 38–126)
Anion gap: 8 (ref 5–15)
BUN: 18 mg/dL (ref 6–20)
CO2: 27 mmol/L (ref 22–32)
Calcium: 8.9 mg/dL (ref 8.9–10.3)
Chloride: 105 mmol/L (ref 98–111)
Creatinine: 0.85 mg/dL (ref 0.61–1.24)
GFR, Estimated: >60 mL/min (ref >60)
Glucose, Bld: 150 mg/dL — ABNORMAL HIGH (ref 70–99)
Potassium: 3.4 mmol/L — ABNORMAL LOW (ref 3.5–5.1)
Sodium: 140 mmol/L (ref 135–145)
Total Bilirubin: 1 mg/dL (ref 0.3–1.2)
Total Protein: 6.8 g/dL (ref 6.5–8.1)

## 2020-08-23 LAB — LACTATE DEHYDROGENASE: LDH: 128 U/L (ref 98–192)

## 2020-08-23 NOTE — Progress Notes (Signed)
Gregory Carey NOTE  Patient Care Team: Sharion Balloon, FNP as PCP - General (Nurse Practitioner) Eloise Harman, DO as Consulting Physician (Internal Medicine) Harlen Labs, MD as Referring Physician (Optometry)  CHIEF COMPLAINTS/PURPOSE OF CONSULTATION:  Lymph node neck   ASSESSMENT & PLAN:   This is a very pleasant 60 year old male with palpable left cervical lymphadenopathy for the past 2 months, no other acute complaints referred to hematology and oncology for further evaluation.  He had ultrasound imaging which showed a pathologic lymph node in the left neck measuring over 3.3 x 2.7 x 2 cm.  No other additional lymph nodes were imaged.  He absolutely denies any complaints.  He is a never smoker.  Physical examination, palpable pathologic lymph node measuring over 3 cm, not very mobile.  No other palpable lymph nodes on exam but his neck anatomy makes it harder to examine. Physical examination otherwise unremarkable.  He does mention that he was told that he is a hard intubation when he had his shoulder surgery. I have recommended that we proceed with PET/CT since this is highly suspicious for metastatic lymphadenopathy, current working diagnosis is head and neck cancer versus possible lymphoma. Have also recommended ENT evaluation to look for a primary as well as biopsy of the left cervical lymph node and return to clinic in 2 to 3 weeks to review results and to discuss additional recommendations.  Thank you for consulting [name] in the care of this patient.  Please not hesitate to contact us with any additional questions or concerns.   HISTORY OF PRESENTING ILLNESS:   Gregory Carey 60 y.o. male is here because of palpable lymph node left neck.  This is a very pleasant 60 year old male patient with past medical history significant for diabetes, hypertension and dyslipidemia referred to hematology for evaluation of's lymphadenopathy.  Patient first noticed  palpable painless left neck lymph node a couple months ago, had a trial of antibiotics for about 2 weeks.    He had ultrasound imaging which showed ultrasound of head and neck showed pathologic lymph node in the left neck measuring 3.3 x 2.7 x 2 cm.  No additional nodes were imaged.  He is doing well today.  He denies any other complaints except for his lymph node.  No difficulty swallowing, sore throat, weight loss, referred pain to the ear.  He is a never smoker, never alcoholic.   Rest of the pertinent 10 point ROS reviewed and unremarkable.  REVIEW OF SYSTEMS:   Constitutional: Denies fevers, chills or abnormal night sweats Eyes: Denies blurriness of vision, double vision or watery eyes Ears, nose, mouth, throat, and face: Denies mucositis or sore throat Respiratory: Denies cough, dyspnea or wheezes Cardiovascular: Denies palpitation, chest discomfort or lower extremity swelling Gastrointestinal:  Denies nausea, heartburn or change in bowel habits Skin: Denies abnormal skin rashes Lymphatics: Denies new lymphadenopathy or easy bruising Neurological:Denies numbness, tingling or new weaknesses Behavioral/Psych: Mood is stable, no new changes  All other systems were reviewed with the patient and are negative.  MEDICAL HISTORY:  Past Medical History:  Diagnosis Date   Diabetes mellitus without complication ([place])    Difficult intubation    Was infomed by MD Anesth his airway was small   Hyperlipidemia    Hypertension    Localized osteoarthritis of left shoulder    Osteoarthritis of left shoulder 09/29/2018   Sleep apnea    uses CPAP    SURGICAL HISTORY: Past Surgical History:  Procedure Laterality Date   BIOPSY  04/23/2020   Procedure: BIOPSY;  Surgeon: Eloise Harman, DO;  [facility];  Service: Endoscopy;;   COLONOSCOPY WITH PROPOFOL N/A 04/23/2020   Procedure: COLONOSCOPY WITH PROPOFOL;  Surgeon: Eloise Harman, DO;  [facility];  Service:  Endoscopy;  Laterality: N/A;  am appt   KNEE ARTHROSCOPY Right 02/16/2006   POLYPECTOMY  04/23/2020   Procedure: POLYPECTOMY;  Surgeon: Eloise Harman, DO;  [facility];  Service: Endoscopy;;   SHOULDER ARTHROSCOPY Right 02/17/2000   SKIN CANCER EXCISION     Multiple basal cell and squmous cell leisons removed   TOTAL SHOULDER ARTHROPLASTY Left 09/29/2018   Procedure: TOTAL SHOULDER ARTHROPLASTY;  Surgeon: Nicholes Stairs, MD;  Location: [facility];  Service: Orthopedics;  Laterality: Left;  2.5 hrs    SOCIAL HISTORY: Social History   Socioeconomic History   Marital status: Married    Spouse name: Not on file   Number of children: Not on file   Years of education: Not on file   Highest education level: Not on file  Occupational History   Not on file  Tobacco Use   Smoking status: Never   Smokeless tobacco: Never  Vaping Use   Vaping Use: Never used  Substance and Sexual Activity   Alcohol use: No   Drug use: No   Sexual activity: Yes  Other Topics Concern   Not on file  Social History Narrative   Not on file   Social Determinants of Health   Financial Resource Strain: Not on file  Food Insecurity: No Food Insecurity   Worried About Running Out of Food in the Last Year: Never true   [place] in the Last Year: Never true  Transportation Needs: No Transportation Needs   Lack of Transportation (Medical): No   Lack of Transportation (Non-Medical): No  Physical Activity: Not on file  Stress: No Stress Concern Present   Feeling of Stress : Only a little  Social Connections: Not on file  Intimate Partner Violence: Not on file    FAMILY HISTORY: Family History  Problem Relation Age of Onset   CAD Father    Diabetes Father    Diabetes Paternal Grandmother    Colon cancer Neg Hx    Colon polyps Neg Hx     ALLERGIES:  has No Known Allergies.  MEDICATIONS:  Current Outpatient Medications  Medication Sig Dispense Refill   amLODipine  (NORVASC) 5 MG tablet Take 1 tablet (5 mg total) by mouth daily. 90 tablet 3   aspirin EC 81 MG tablet Take 1 tablet (81 mg total) by mouth daily. 90 tablet 1   blood glucose meter kit and supplies Dispense based on patient and insurance preference. Use up to four times daily as directed. (FOR ICD-9 250.00, 250.01). 1 each 0   CONTOUR NEXT TEST test strip USE AS DIRECTED TWICE DAILY 100 each 0   Dulaglutide (TRULICITY) 3.00 PQ/3.3AQ SOPN INJECT 0.75MG SUBCUTANEOUSLY ONCE WEEKLY (Patient taking differently: Inject 0.75 mg into the muscle every Sunday. INJECT 0.75MG SUBCUTANEOUSLY ONCE WEEKLY) 0.15 mL 6   lisinopril-hydrochlorothiazide (ZESTORETIC) 20-12.5 MG tablet Take 2 tablets by mouth daily. 180 tablet 2   metFORMIN (GLUCOPHAGE-XR) 750 MG 24 hr tablet Take 1 tablet (750 mg total) by mouth daily with breakfast. 90 tablet 0   rosuvastatin (CRESTOR) 20 MG tablet Take 1 tablet (20 mg total) by mouth daily. 90 tablet 3   No current  facility-administered medications for this visit.     PHYSICAL EXAMINATION:  ECOG PERFORMANCE STATUS: 0 - Asymptomatic  Vitals:   08/23/20 0854  BP: (!) 154/82  Pulse: 68  Resp: 18  Temp: 98.4 F (36.9 C)  SpO2: 98%   Filed Weights   08/23/20 0854  Weight: 275 lb 12.8 oz (125.1 kg)    GENERAL:alert, no distress and comfortable SKIN: skin color, texture, turgor are normal, no rashes or significant lesions EYES: normal, conjunctiva are pink and non-injected, sclera clear OROPHARYNX:no exudate, no erythema and lips, buccal mucosa, and tongue normal  NECK: supple, thyroid normal size, non-tender, without nodularity LYMPH: Palpable left cervical lymph node, measuring over 3 cm on physical exam.  No other palpable lymphadenopathy. LUNGS: clear to auscultation and percussion with normal breathing effort HEART: regular rate & rhythm and no murmurs and no lower extremity edema ABDOMEN:abdomen soft, non-tender and normal bowel sounds Musculoskeletal:no cyanosis  of digits and no clubbing  PSYCH: alert & oriented x 3 with fluent speech NEURO: no focal motor/sensory deficits  LABORATORY DATA:  I have reviewed the data as listed Lab Results  Component Value Date   WBC 8.7 07/30/2020   HGB 15.9 07/30/2020   HCT 49.7 07/30/2020   MCV 88 07/30/2020   PLT 280 07/30/2020     Chemistry      Component Value Date/Time   NA 143 07/30/2020 1536   K 4.4 07/30/2020 1536   CL 101 07/30/2020 1536   CO2 24 07/30/2020 1536   BUN 13 07/30/2020 1536   CREATININE 1.04 07/30/2020 1536      Component Value Date/Time   CALCIUM 10.1 07/30/2020 1536   ALKPHOS 66 07/30/2020 1536   AST 27 07/30/2020 1536   ALT 42 07/30/2020 1536   BILITOT 0.9 07/30/2020 1536       RADIOGRAPHIC STUDIES: I have personally reviewed the radiological images as listed and agreed with the findings in the report. US Soft Tissue Head/Neck (NON-THYROID)  Result Date: 08/06/2020 CLINICAL DATA:  60 year old male with enlarged lump on the neck EXAM: ULTRASOUND OF HEAD/NECK SOFT TISSUES TECHNIQUE: Ultrasound examination of the head and neck soft tissues was performed in the area of clinical concern. COMPARISON:  None. FINDINGS: Grayscale and color duplex performed in the region clinical concern. Pathologically enlarged/configured lymph node on the left neck measuring 3.3 cm x 2.7 cm x 2.0 cm. Hilar color flow. No additional nodes are imaged. IMPRESSION: Pathologic lymph node in the region clinical concern. Lymphoproliferative disorder or other metastatic disease cannot be excluded. Electronically Signed   By: Corrie Mckusick D.O.   On: 08/06/2020 14:46    All questions were answered. The patient knows to call the clinic with any problems, questions or concerns. I spent 45 minutes in the care of this patient including H and P, review of records, counseling and coordination of care.     Benay Pike, MD 08/23/2020 9:43 AM

## 2020-08-23 NOTE — Progress Notes (Signed)
Referral sent to Dr Constance Holster ENT/  records faxed on 08/23/20

## 2020-08-26 NOTE — Progress Notes (Signed)
Appt w/Dr Constance Holster 09/02/20 @ 3PM.  Patient aware.  Per office, no fax required.  They can view records on Epic.

## 2020-08-27 ENCOUNTER — Encounter (HOSPITAL_COMMUNITY): Payer: Self-pay

## 2020-08-27 NOTE — Progress Notes (Unsigned)
Patient Demographics  Patient Name  Taysean, Wager Legal Sex  Male DOB  [birth year] SSN  [id number] Address  [place] [address] Phone  [phone] [facility])  [phone] (Work)  [phone] (Mobile) *Preferred*     RE: Biopsy Received: Today Arne Cleveland, MD  Lenore Cordia Ok   [name] core L cerv LAN  Met v lymphoma   DDH         Previous Messages    ----- Message -----  From: Lenore Cordia  Sent: 08/26/2020   5:11 PM EDT  To: Ir Procedure Requests  Subject: Biopsy                                         Procedure Requested: CT Biopsy   Reason for Procedure:lymph node    Provider Requesting: Dr Chryl Heck  Provider Telephone:  [phone]    Future appt: NM PET  July 25

## 2020-08-29 ENCOUNTER — Other Ambulatory Visit: Payer: Self-pay | Admitting: Radiology

## 2020-08-30 ENCOUNTER — Ambulatory Visit (HOSPITAL_COMMUNITY)
Admission: RE | Admit: 2020-08-30 | Discharge: 2020-08-30 | Disposition: A | Discharge status: Home / Self Care | Payer: BC Managed Care – PPO | Source: Ambulatory Visit | Attending: Hematology and Oncology | Admitting: Hematology and Oncology

## 2020-08-30 ENCOUNTER — Encounter (HOSPITAL_COMMUNITY): Payer: Self-pay

## 2020-08-30 ENCOUNTER — Other Ambulatory Visit: Payer: Self-pay

## 2020-08-30 DIAGNOSIS — C801 Malignant (primary) neoplasm, unspecified: Secondary | ICD-10-CM | POA: Diagnosis not present

## 2020-08-30 DIAGNOSIS — R590 Localized enlarged lymph nodes: Secondary | ICD-10-CM | POA: Diagnosis not present

## 2020-08-30 DIAGNOSIS — C770 Secondary and unspecified malignant neoplasm of lymph nodes of head, face and neck: Secondary | ICD-10-CM | POA: Diagnosis not present

## 2020-08-30 HISTORY — DX: Other complications of anesthesia, initial encounter: T88.59XA

## 2020-08-30 LAB — CBC
HCT: 45.7 % (ref 39.0–52.0)
Hemoglobin: 15.6 g/dL (ref 13.0–17.0)
MCH: 29.5 pg (ref 26.0–34.0)
MCHC: 34.1 g/dL (ref 30.0–36.0)
MCV: 86.4 fL (ref 80.0–100.0)
Platelets: 251 10*3/uL (ref 150–400)
RBC: 5.29 MIL/uL (ref 4.22–5.81)
RDW: 12.6 % (ref 11.5–15.5)
WBC: 7.3 10*3/uL (ref 4.0–10.5)
nRBC: 0 % (ref 0.0–0.2)

## 2020-08-30 LAB — PROTIME-INR
INR: 1 (ref 0.8–1.2)
Prothrombin Time: 13.3 seconds (ref 11.4–15.2)

## 2020-08-30 LAB — APTT: aPTT: 28 seconds (ref 24–36)

## 2020-08-30 LAB — GLUCOSE, CAPILLARY: Glucose-Capillary: 92 mg/dL (ref 70–99)

## 2020-08-30 MED ORDER — SODIUM CHLORIDE 0.9 % IV SOLN: INTRAVENOUS | Status: DC

## 2020-08-30 MED ORDER — GELATIN ABSORBABLE 12-7 MM EX MISC
CUTANEOUS | Status: AC
  Filled 2020-08-30: qty 1

## 2020-08-30 MED ORDER — LIDOCAINE HCL 1 % IJ SOLN
INTRAMUSCULAR | Status: AC
  Filled 2020-08-30: qty 20

## 2020-08-30 MED ORDER — MIDAZOLAM HCL 2 MG/2ML IJ SOLN
INTRAMUSCULAR | Status: AC | PRN
  Administered 2020-08-30 (×2): 1 mg via INTRAVENOUS

## 2020-08-30 MED ORDER — FENTANYL CITRATE (PF) 100 MCG/2ML IJ SOLN
INTRAMUSCULAR | Status: AC | PRN
  Administered 2020-08-30: 50 ug via INTRAVENOUS

## 2020-08-30 MED ORDER — LIDOCAINE HCL 1 % IJ SOLN
INTRAMUSCULAR | Status: AC | PRN
  Administered 2020-08-30: 5 mL

## 2020-08-30 MED ORDER — MIDAZOLAM HCL 2 MG/2ML IJ SOLN
INTRAMUSCULAR | Status: AC
  Filled 2020-08-30: qty 2

## 2020-08-30 MED ORDER — FENTANYL CITRATE (PF) 100 MCG/2ML IJ SOLN
INTRAMUSCULAR | Status: AC
  Filled 2020-08-30: qty 2

## 2020-08-30 NOTE — Discharge Instructions (Signed)
Urgent needs - Interventional Radiology on call MD [phone]  Wound - May remove dressing and shower tomorrow.  Keep site clean and dry.  Replace with bandaid as needed.  Do not submerge in tub or water until site healing well. If closed with glue, glue will flake off on its own.   

## 2020-08-30 NOTE — Procedures (Signed)
Interventional Radiology Procedure Note  Procedure: US guided core biopsy of left submandibular LN  Complications: None  Estimated Blood Loss: None  Recommendations: - DC Home  Signed,  Criselda Peaches, MD

## 2020-08-30 NOTE — Consult Note (Signed)
 Chief Complaint: Patient was seen in consultation today for image guided biopsy of left cervical lymph node  Referring Physician(s): Iruku,Praveena  Supervising Physician: McCullough, Heath  Patient Status: [facility] - Out-pt  History of Present Illness: Gregory Carey is a 59 y.o. male, non-smoker, with past medical history of diabetes, hyperlipidemia, hypertension, osteoarthritis, sleep apnea, prior skin cancers who presents now with a 3-month history of palpable left cervical lymph node.  He has previously been on antibiotics, however adenopathy persists.  Ultrasound of the neck done on 08/06/2020 revealed pathologic lymph node left cervical region.  He presents today for image guided biopsy of the lymph node for further evaluation.  Past Medical History:  Diagnosis Date   Complication of anesthesia    Diabetes mellitus without complication ([facility])    Difficult intubation    Was infomed by MD Anesth his airway was small   Hyperlipidemia    Hypertension    Localized osteoarthritis of left shoulder    Osteoarthritis of left shoulder 09/29/2018   Sleep apnea    uses CPAP    Past Surgical History:  Procedure Laterality Date   BIOPSY  04/23/2020   Procedure: BIOPSY;  Surgeon: Carver, Charles K, DO;  [facility];  Service: Endoscopy;;   COLONOSCOPY WITH PROPOFOL N/A 04/23/2020   Procedure: COLONOSCOPY WITH PROPOFOL;  Surgeon: Carver, Charles K, DO;  [facility];  Service: Endoscopy;  Laterality: N/A;  am appt   KNEE ARTHROSCOPY Right 02/16/2006   POLYPECTOMY  04/23/2020   Procedure: POLYPECTOMY;  Surgeon: Carver, Charles K, DO;  [facility];  Service: Endoscopy;;   SHOULDER ARTHROSCOPY Right 02/17/2000   SKIN CANCER EXCISION     Multiple basal cell and squmous cell leisons removed   TOTAL SHOULDER ARTHROPLASTY Left 09/29/2018   Procedure: TOTAL SHOULDER ARTHROPLASTY;  Surgeon: Rogers, Jason Patrick, MD;  Location[facility];  Service: Orthopedics;   Laterality: Left;  2.5 hrs    Allergies: Patient has no known allergies.  Medications: Prior to Admission medications   Medication Sig Start Date End Date Taking? Authorizing Provider  amLODipine (NORVASC) 5 MG tablet Take 1 tablet (5 mg total) by mouth daily. 07/30/20  Yes Hawks, Christy A, FNP  lisinopril-hydrochlorothiazide (ZESTORETIC) 20-12.5 MG tablet Take 2 tablets by mouth daily. 07/30/20  Yes Hawks, Christy A, FNP  metFORMIN (GLUCOPHAGE-XR) 750 MG 24 hr tablet Take 1 tablet (750 mg total) by mouth daily with breakfast. 07/30/20  Yes Hawks, Christy A, FNP  rosuvastatin (CRESTOR) 20 MG tablet Take 1 tablet (20 mg total) by mouth daily. 07/30/20  Yes Hawks, Christy A, FNP  aspirin EC 81 MG tablet Take 1 tablet (81 mg total) by mouth daily. 08/30/15   Hawks, Christy A, FNP  blood glucose meter kit and supplies Dispense based on patient and insurance preference. Use up to four times daily as directed. (FOR ICD-9 250.00, 250.01). 07/29/15   Hawks, Christy A, FNP  CONTOUR NEXT TEST test strip USE AS DIRECTED TWICE DAILY 08/04/16   Hawks, Christy A, FNP  Dulaglutide (TRULICITY) 0.75 MG/0.5ML SOPN INJECT 0.75MG SUBCUTANEOUSLY ONCE WEEKLY Patient taking differently: Inject 0.75 mg into the muscle every Sunday. INJECT 0.75MG SUBCUTANEOUSLY ONCE WEEKLY 01/30/20   Hawks, Christy A, FNP     Family History  Problem Relation Age of Onset   CAD Father    Diabetes Father    Diabetes Paternal Grandmother    Colon cancer Neg Hx    Colon polyps Neg Hx       Social History   Socioeconomic History   Marital status: Married    Spouse name: Not on file   Number of children: Not on file   Years of education: Not on file   Highest education level: Not on file  Occupational History   Not on file  Tobacco Use   Smoking status: Never   Smokeless tobacco: Never  Vaping Use   Vaping Use: Never used  Substance and Sexual Activity   Alcohol use: No   Drug use: No   Sexual activity: Yes  Other Topics  Concern   Not on file  Social History Narrative   Not on file   Social Determinants of Health   Financial Resource Strain: Not on file  Food Insecurity: No Food Insecurity   Worried About Running Out of Food in the Last Year: Never true   [place] in the Last Year: Never true  Transportation Needs: No Transportation Needs   Lack of Transportation (Medical): No   Lack of Transportation (Non-Medical): No  Physical Activity: Not on file  Stress: No Stress Concern Present   Feeling of Stress : Only a little  Social Connections: Not on file    Review of Systems denies fever, headache, chest pain, dyspnea, cough, abdominal/back pain, nausea, vomiting or bleeding  Vital Signs: BP 134/86   Pulse 63   Temp 97.9 F (36.6 C) (Oral)   Resp 18   SpO2 98%   Physical Exam awake, alert.  Chest clear to auscultation bilaterally.  Heart with regular rate and rhythm.  Abdomen soft, positive bowel sounds, nontender.  No significant lower extremity edema.  Palpable left cervical lymph node present, nontender to palpation  Imaging: US Soft Tissue Head/Neck (NON-THYROID)  Result Date: 08/06/2020 CLINICAL DATA:  60 year old male with enlarged lump on the neck EXAM: ULTRASOUND OF HEAD/NECK SOFT TISSUES TECHNIQUE: Ultrasound examination of the head and neck soft tissues was performed in the area of clinical concern. COMPARISON:  None. FINDINGS: Grayscale and color duplex performed in the region clinical concern. Pathologically enlarged/configured lymph node on the left neck measuring 3.3 cm x 2.7 cm x 2.0 cm. Hilar color flow. No additional nodes are imaged. IMPRESSION: Pathologic lymph node in the region clinical concern. Lymphoproliferative disorder or other metastatic disease cannot be excluded. Electronically Signed   By: Corrie Mckusick D.O.   On: 08/06/2020 14:46    Labs:  CBC: Recent Labs    01/30/20 1621 07/30/20 1536 08/23/20 1033 08/30/20 1200  WBC 8.2 8.7 7.2 7.3  HGB 16.2 15.9  15.4 15.6  HCT 46.4 49.7 45.2 45.7  PLT 308 280 249 251    COAGS: No results for input(s): INR, APTT in the last 8760 hours.  BMP: Recent Labs    01/30/20 1621 07/30/20 1536 08/23/20 1033  NA 141 143 140  K 3.9 4.4 3.4*  CL 103 101 105  CO2 _0 GLUCOSE 107* 91 150*  BUN _1 CALCIUM 10.0 10.1 8.9  CREATININE 0.98 1.04 0.85  GFRNONAA 85  --  >60  GFRAA 98  --   --     LIVER FUNCTION TESTS: Recent Labs    01/30/20 1621 07/30/20 1536 08/23/20 1033  BILITOT 0.6 0.9 1.0  AST 26 27 33  ALT 38 42 50*  ALKPHOS 61 66 50  PROT 6.7 7.0 6.8  ALBUMIN 4.7 4.8 4.2    TUMOR MARKERS: No results for input(s): AFPTM, CEA, CA199, CHROMGRNA in the last 8760  hours.  Assessment and Plan: 59 y.o. male, non-smoker, with past medical history of diabetes, hyperlipidemia, hypertension, osteoarthritis, sleep apnea, prior skin cancers who presents now with a 3-month history of palpable left cervical lymph node.  He has previously been on antibiotics, however adenopathy persists.  Ultrasound of the neck done on 08/06/2020 revealed pathologic lymph node left cervical region.  He presents today for image guided biopsy of the lymph node for further evaluation.Risks and benefits of procedure was discussed with the patient  including, but not limited to bleeding, infection, damage to adjacent structures or low yield requiring additional tests.  All of the questions were answered and there is agreement to proceed.  Consent signed and in chart.    Thank you for this interesting consult.  I greatly enjoyed meeting Jahmire L Rhymes and look forward to participating in their care.  A copy of this report was sent to the requesting provider on this date.  Electronically Signed: D. Kevin Allred, PA-C 08/30/2020, 12:18 PM   I spent a total of   20 minutes  in face to face in clinical consultation, greater than 50% of which was counseling/coordinating care for image guided left cervical lymph  node biopsy  

## 2020-09-02 DIAGNOSIS — H6123 Impacted cerumen, bilateral: Secondary | ICD-10-CM | POA: Diagnosis not present

## 2020-09-02 DIAGNOSIS — R590 Localized enlarged lymph nodes: Secondary | ICD-10-CM | POA: Diagnosis not present

## 2020-09-03 LAB — SURGICAL PATHOLOGY

## 2020-09-04 ENCOUNTER — Other Ambulatory Visit: Payer: Self-pay | Admitting: Hematology and Oncology

## 2020-09-04 DIAGNOSIS — R590 Localized enlarged lymph nodes: Secondary | ICD-10-CM

## 2020-09-04 DIAGNOSIS — C801 Malignant (primary) neoplasm, unspecified: Secondary | ICD-10-CM

## 2020-09-09 ENCOUNTER — Ambulatory Visit (HOSPITAL_COMMUNITY)
Admission: RE | Admit: 2020-09-09 | Discharge: 2020-09-09 | Disposition: A | Discharge status: Home / Self Care | Payer: BC Managed Care – PPO | Source: Ambulatory Visit | Attending: Hematology and Oncology | Admitting: Hematology and Oncology

## 2020-09-09 ENCOUNTER — Ambulatory Visit (HOSPITAL_COMMUNITY): Payer: BC Managed Care – PPO

## 2020-09-09 ENCOUNTER — Other Ambulatory Visit: Payer: Self-pay

## 2020-09-09 DIAGNOSIS — C760 Malignant neoplasm of head, face and neck: Secondary | ICD-10-CM | POA: Diagnosis not present

## 2020-09-09 DIAGNOSIS — R590 Localized enlarged lymph nodes: Secondary | ICD-10-CM | POA: Diagnosis not present

## 2020-09-09 LAB — GLUCOSE, CAPILLARY: Glucose-Capillary: 109 mg/dL — ABNORMAL HIGH (ref 70–99)

## 2020-09-09 MED ORDER — FLUDEOXYGLUCOSE F - 18 (FDG) INJECTION
14.3000 | Freq: Once | INTRAVENOUS | Status: AC | PRN
  Administered 2020-09-09: 14.8 via INTRAVENOUS

## 2020-09-11 ENCOUNTER — Other Ambulatory Visit: Payer: Self-pay

## 2020-09-11 ENCOUNTER — Ambulatory Visit (HOSPITAL_COMMUNITY)
Admission: RE | Admit: 2020-09-11 | Discharge: 2020-09-11 | Disposition: A | Discharge status: Home / Self Care | Payer: BC Managed Care – PPO | Source: Ambulatory Visit | Attending: Hematology and Oncology | Admitting: Hematology and Oncology

## 2020-09-11 DIAGNOSIS — R590 Localized enlarged lymph nodes: Secondary | ICD-10-CM | POA: Insufficient documentation

## 2020-09-11 DIAGNOSIS — M4802 Spinal stenosis, cervical region: Secondary | ICD-10-CM | POA: Diagnosis not present

## 2020-09-11 DIAGNOSIS — C801 Malignant (primary) neoplasm, unspecified: Secondary | ICD-10-CM | POA: Diagnosis not present

## 2020-09-11 DIAGNOSIS — J351 Hypertrophy of tonsils: Secondary | ICD-10-CM | POA: Diagnosis not present

## 2020-09-11 DIAGNOSIS — M47812 Spondylosis without myelopathy or radiculopathy, cervical region: Secondary | ICD-10-CM | POA: Diagnosis not present

## 2020-09-11 MED ORDER — IOHEXOL 350 MG/ML SOLN
75.0000 mL | Freq: Once | INTRAVENOUS | Status: AC | PRN
  Administered 2020-09-11: 75 mL via INTRAVENOUS

## 2020-09-13 ENCOUNTER — Encounter (HOSPITAL_COMMUNITY): Payer: Self-pay | Admitting: Hematology and Oncology

## 2020-09-13 ENCOUNTER — Other Ambulatory Visit: Payer: Self-pay

## 2020-09-13 ENCOUNTER — Inpatient Hospital Stay (HOSPITAL_BASED_OUTPATIENT_CLINIC_OR_DEPARTMENT_OTHER): Payer: BC Managed Care – PPO | Admitting: Hematology and Oncology

## 2020-09-13 VITALS — BP 128/74 | HR 63 | Temp 98.4°F | Resp 18 | Wt 273.0 lb

## 2020-09-13 DIAGNOSIS — C779 Secondary and unspecified malignant neoplasm of lymph node, unspecified: Secondary | ICD-10-CM | POA: Insufficient documentation

## 2020-09-13 DIAGNOSIS — R590 Localized enlarged lymph nodes: Secondary | ICD-10-CM | POA: Diagnosis not present

## 2020-09-13 NOTE — Progress Notes (Addendum)
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  He had ultrasound imaging which showed a pathologic lymph node in the left neck measuring over 3.3 x 2.7 x 2 cm.  He is a never smoker.  Physical examination, palpable pathologic lymph node measuring about 3 cm, not very mobile.  No other palpable lymph nodes on exam but his neck anatomy makes it harder to examine. Sin CT ce his last visit, he had a visit with ENT team, had a PET/CT imaging and CT imaging of his neck. The neck showed 2 pathologic left level 2 lymph nodes, no primary lesion or contralateral metastatic node by CT. PET scan showed relatively symmetric hypermetabolic FDG uptake in the anterior hypopharynx, no discrete oropharyngeal hypermetabolic lesion, hypermetabolic left-sided level 2 cervical lymphadenopathy no evidence of hypermetabolic metastatic disease in chest abdomen pelvis, hepatic steatosis, aortic atherosclerosis and thoracic aortic aneurysm which needs to be monitored. Left submandibular lymph node biopsy showed poorly differentiated carcinoma, most of the tumor has features which favor poorly differentiated squamous cell carcinoma however there is focal cytoplasmic vacuolization in the differential diagnosis which could also include mucoid epidermoid carcinoma.  Addendum issued showed that by immunohistochemistry, neoplastic cells are  positive for cytokeratin 5/6, p63, p40 0 and p16 but negative for EBV by in situ hybridization.  Mucicarmine also appears negative.  Overall immunophenotype favors a poorly differentiated squamous cell carcinoma. Given unknown primary and p16 positive squamous cell carcinoma, I do believe it is most likely a head and neck primary.  He will benefit from surgery, neck dissection as well as endoscopic evaluation during surgery for primary.  He is trying to avoid chemotherapy if possible.  He is open to surgical discussion.  Have called Dr. Constance Holster and discussed the case with him, he will follow-up with Dr. Constance Holster for surgical discussion.  We will also present him in the head and neck tumor board coming Wednesday for further recommendations.  If he undergoes surgery, he will may still need adjuvant radiation plus or minus chemotherapy.  He was recommended to follow-up with his PCP for thoracic aortic enlargement and he expressed understanding.  He will need a CT angiogram once a year.  Thank you for consulting [name] in the care of this patient.  Please not hesitate to contact us with any additional questions or concerns.   HISTORY OF PRESENTING ILLNESS:   Gregory Carey 60 y.o. male is here because of palpable lymph node left neck.  This is a very pleasant 60 year old male patient with past medical history significant for diabetes, hypertension and dyslipidemia referred to hematology for evaluation of's lymphadenopathy.  Patient first noticed palpable painless left neck lymph node a couple months ago, had a trial of antibiotics for about 2 weeks.    He had ultrasound imaging which showed ultrasound of head and neck showed pathologic lymph node in the left neck measuring 3.3 x 2.7 x 2 cm.  No additional nodes were imaged.  He is doing well  today.  He denies any other complaints except for his lymph node.  No difficulty swallowing, sore throat, weight loss, referred pain to the ear.  He is a never smoker, never  alcoholic.   Since his last visit, he had seen ENT, had CT, pet imaging and flexible fiberoptic laryngoscopy by Dr. Constance Holster.  He tells me that the lymph node might have gotten a bit larger after the biopsy but denies any other complaints.  He is hoping to skip chemotherapy if possible, would like it to be resected if at all possible.  Rest of the pertinent 10 point ROS reviewed and unremarkable.  REVIEW OF SYSTEMS:   Constitutional: Denies fevers, chills or abnormal night sweats Eyes: Denies blurriness of vision, double vision or watery eyes Ears, nose, mouth, throat, and face: Denies mucositis or sore throat Respiratory: Denies cough, dyspnea or wheezes Cardiovascular: Denies palpitation, chest discomfort or lower extremity swelling Gastrointestinal:  Denies nausea, heartburn or change in bowel habits Skin: Denies abnormal skin rashes Lymphatics: Denies new lymphadenopathy or easy bruising Neurological:Denies numbness, tingling or new weaknesses Behavioral/Psych: Mood is stable, no new changes  All other systems were reviewed with the patient and are negative.  MEDICAL HISTORY:  Past Medical History:  Diagnosis Date   Complication of anesthesia    Diabetes mellitus without complication ([place])    Difficult intubation    Was infomed by MD Anesth his airway was small   Hyperlipidemia    Hypertension    Localized osteoarthritis of left shoulder    Osteoarthritis of left shoulder 09/29/2018   Sleep apnea    uses CPAP    SURGICAL HISTORY: Past Surgical History:  Procedure Laterality Date   BIOPSY  04/23/2020   Procedure: BIOPSY;  Surgeon: Eloise Harman, DO;  [facility];  Service: Endoscopy;;   COLONOSCOPY WITH PROPOFOL N/A 04/23/2020   Procedure: COLONOSCOPY WITH PROPOFOL;  Surgeon: Eloise Harman, DO;  Location: [facility];  Service: Endoscopy;  Laterality: N/A;  am appt   KNEE ARTHROSCOPY Right 02/16/2006   POLYPECTOMY  04/23/2020   Procedure: POLYPECTOMY;   Surgeon: Eloise Harman, DO;  [facility]: [facility];  Service: Endoscopy;;   SHOULDER ARTHROSCOPY Right 02/17/2000   SKIN CANCER EXCISION     Multiple basal cell and squmous cell leisons removed   TOTAL SHOULDER ARTHROPLASTY Left 09/29/2018   Procedure: TOTAL SHOULDER ARTHROPLASTY;  Surgeon: Nicholes Stairs, MD;  Location: [facility];  Service: Orthopedics;  Laterality: Left;  2.5 hrs    SOCIAL HISTORY: Social History   Socioeconomic History   Marital status: Married    Spouse name: Not on file   Number of children: Not on file   Years of education: Not on file   Highest education level: Not on file  Occupational History   Not on file  Tobacco Use   Smoking status: Never   Smokeless tobacco: Never  Vaping Use   Vaping Use: Never used  Substance and Sexual Activity   Alcohol use: No   Drug use: No   Sexual activity: Yes  Other Topics Concern   Not on file  Social History Narrative   Not on file   Social Determinants of Health   Financial Resource Strain: Not on file  Food Insecurity: No Food Insecurity   Worried About Running Out of Food in the Last Year: Never true   [place] in the Last Year: Never true  Transportation Needs: No Transportation Needs   Lack  of Transportation (Medical): No   Lack of Transportation (Non-Medical): No  Physical Activity: Not on file  Stress: No Stress Concern Present   Feeling of Stress : Only a little  Social Connections: Not on file  Intimate Partner Violence: Not on file    FAMILY HISTORY: Family History  Problem Relation Age of Onset   CAD Father    Diabetes Father    Diabetes Paternal Grandmother    Colon cancer Neg Hx    Colon polyps Neg Hx     ALLERGIES:  has No Known Allergies.  MEDICATIONS:  Current Outpatient Medications  Medication Sig Dispense Refill   amLODipine (NORVASC) 5 MG tablet Take 1 tablet (5 mg total) by mouth daily. 90 tablet 3   blood glucose meter kit and supplies Dispense based on  patient and insurance preference. Use up to four times daily as directed. (FOR ICD-9 250.00, 250.01). 1 each 0   CONTOUR NEXT TEST test strip USE AS DIRECTED TWICE DAILY 100 each 0   Dulaglutide (TRULICITY) 4.70 JG/2.8ZM SOPN INJECT 0.75MG SUBCUTANEOUSLY ONCE WEEKLY (Patient taking differently: Inject 0.75 mg into the muscle every Sunday. INJECT 0.75MG SUBCUTANEOUSLY ONCE WEEKLY) 0.15 mL 6   lisinopril-hydrochlorothiazide (ZESTORETIC) 20-12.5 MG tablet Take 2 tablets by mouth daily. 180 tablet 2   metFORMIN (GLUCOPHAGE-XR) 750 MG 24 hr tablet Take 1 tablet (750 mg total) by mouth daily with breakfast. 90 tablet 0   rosuvastatin (CRESTOR) 20 MG tablet Take 1 tablet (20 mg total) by mouth daily. 90 tablet 3   aspirin EC 81 MG tablet Take 1 tablet (81 mg total) by mouth daily. (Patient not taking: Reported on 09/13/2020) 90 tablet 1   No current facility-administered medications for this visit.     PHYSICAL EXAMINATION:  ECOG PERFORMANCE STATUS: 0 - Asymptomatic  Vitals:   09/13/20 1141  BP: 128/74  Pulse: 63  Resp: 18  Temp: 98.4 F (36.9 C)  SpO2: 97%   Filed Weights   09/13/20 1141  Weight: 273 lb (123.8 kg)    Focused exam, palpable left cervical lymph node measuring about 3 cm on physical exam, no other palpable lymphadenopathy.  LABORATORY DATA:  I have reviewed the data as listed Lab Results  Component Value Date   WBC 7.3 08/30/2020   HGB 15.6 08/30/2020   HCT 45.7 08/30/2020   MCV 86.4 08/30/2020   PLT 251 08/30/2020     Chemistry      Component Value Date/Time   NA 140 08/23/2020 1033   NA 143 07/30/2020 1536   K 3.4 (L) 08/23/2020 1033   CL 105 08/23/2020 1033   CO2 27 08/23/2020 1033   BUN 18 08/23/2020 1033   BUN 13 07/30/2020 1536   CREATININE 0.85 08/23/2020 1033      Component Value Date/Time   CALCIUM 8.9 08/23/2020 1033   ALKPHOS 50 08/23/2020 1033   AST 33 08/23/2020 1033   ALT 50 (H) 08/23/2020 1033   BILITOT 1.0 08/23/2020 1033        RADIOGRAPHIC STUDIES: I have personally reviewed the radiological images as listed and agreed with the findings in the report. CT Soft Tissue Neck W Contrast  Result Date: 09/12/2020 CLINICAL DATA:  Cancer of unknown primary by lymph node biopsy. EXAM: CT NECK WITH CONTRAST TECHNIQUE: Multidetector CT imaging of the neck was performed using the standard protocol following the bolus administration of intravenous contrast. CONTRAST:  29m OMNIPAQUE IOHEXOL 350 MG/ML SOLN COMPARISON:  PET CT from 2 days ago FINDINGS:  Pharynx and larynx: A primary mass is not clearly defined along the mucosal surfaces of the aerodigestive tract. Lingual tonsil hypertrophy which is symmetric. Limitation at the oral cavity due to streak artifact from dental amalgam. Salivary glands: No inflammation, mass, or stone. Thyroid: Normal. Lymph nodes: Pathologic 2.9 cm left jugulodigastric node. More inferior and posterior jugular node measuring 1 cm also hypermetabolic. No contralateral adenopathy or cavitation. Vascular: Atheromatous wall thickening and mild calcification. Partially covered ascending aortic dilatation. Limited intracranial: Negative Visualized orbits: Negative Mastoids and visualized paranasal sinuses: No acute sinusitis. Skeleton: Prominent cervical spine degeneration for age affecting right-sided facets and diffusely involving the disc spaces with uncovertebral spurs causing multilevel foraminal stenosis. Upper chest: Reported separately. IMPRESSION: Two pathologic left level 2 lymph nodes as seen on prior PET-CT. No primary lesion or contralateral metastatic node by CT. Electronically Signed   By: Monte Fantasia M.D.   On: 09/12/2020 14:56   NM PET Image Initial (PI) Skull Base To Thigh  Addendum Date: 09/10/2020   ADDENDUM REPORT: 09/10/2020 10:11 ADDENDUM: As noted in the body of the report, the ascending thoracic aorta measures up to 4.4 cm diameter. Recommend annual imaging followup by CTA or MRA. This  recommendation follows 2010 ACCF/AHA/AATS/ACR/ASA/SCA/SCAI/SIR/STS/SVM Guidelines for the Diagnosis and Management of Patients with Thoracic Aortic Disease. Circulation. 2010; 121: Q222-L798. Aortic aneurysm NOS (ICD10-I71.9). Electronically Signed   By: Misty Stanley M.D.   On: 09/10/2020 10:11   Result Date: 09/10/2020 CLINICAL DATA:  Initial treatment strategy for head neck cancer. EXAM: NUCLEAR MEDICINE PET SKULL BASE TO THIGH TECHNIQUE: 14.8 mCi F-18 FDG was injected intravenously. Full-ring PET imaging was performed from the skull base to thigh after the radiotracer. CT data was obtained and used for attenuation correction and anatomic localization. Fasting blood glucose: 109 mg/dl COMPARISON:  None. FINDINGS: Mediastinal blood pool activity: SUV max 2.8 Liver activity: SUV max NA NECK: Symmetric uptake identified along the anterior hypopharynx. A left-sided level II lymph node measures 2.8 x 1.9 cm on image 39/4 with SUV max = 9.5. 7 mm short axis low left-sided level II node on 92/1 is hypermetabolic with SUV max = 3.1. No hypermetabolic lymphadenopathy in the right neck. Incidental CT findings: none CHEST: No hypermetabolic mediastinal or hilar nodes. No suspicious pulmonary nodules on the CT scan. Incidental CT findings: Ascending thoracic aorta measures up to 4.4 cm diameter. Calcified lymph nodes in the anterior mediastinum and inferior right hilum suggest prior granulomatous disease. Calcified granuloma noted right lower lobe. ABDOMEN/PELVIS: No abnormal hypermetabolic activity within the liver, pancreas, adrenal glands, or spleen. No hypermetabolic lymph nodes in the abdomen or pelvis. Incidental CT findings: Diffuse low attenuation of liver parenchyma is compatible with fatty deposition. Cysts in the left kidney measure up to 6.7 cm moderate atherosclerotic calcification noted in the abdominal aorta without aneurysm. Diverticular changes noted left colon without diverticulitis. SKELETON: Mottled  radiotracer uptake is identified in the bony anatomy without a definite hypermetabolic bone metastasis identified. Incidental CT findings: Status post left shoulder replacement. Degenerative changes noted right shoulder IMPRESSION: 1. Relatively symmetric hypermetabolic FDG uptake identified in the anterior hypopharynx. No discrete oro pharyngeal hypermetabolic lesion evident on PET imaging. CT neck with contrast could be used to further evaluate as clinically warranted. 2. Hypermetabolic left-sided level II cervical lymphadenopathy. 3. No evidence for hypermetabolic metastatic disease in the chest, abdomen, or pelvis. 4. Hepatic steatosis. 5.  Aortic Atherosclerois (ICD10-170.0) Electronically Signed: By: Misty Stanley M.D. On: 09/10/2020 10:08   [name] CORE BIOPSY (  LYMPH NODES)  Result Date: 08/30/2020 INDICATION: 60 year old male with abnormal left submandibular lymphadenopathy. He presents for ultrasound-guided core biopsy of the same. EXAM: ULTRASOUND OF THE LYMPH NODES MEDICATIONS: None. ANESTHESIA/SEDATION: Moderate (conscious) sedation was employed during this procedure. A total of Versed 2 mg and Fentanyl 50 mcg was administered intravenously. Moderate Sedation Time: 12 minutes. The patient's level of consciousness and vital signs were monitored continuously by radiology nursing throughout the procedure under my direct supervision. FLUOROSCOPY TIME:  None COMPLICATIONS: None immediate. PROCEDURE: Informed written consent was obtained from the patient after a thorough discussion of the procedural risks, benefits and alternatives. All questions were addressed. Maximal Sterile Barrier Technique was utilized including caps, mask, sterile gowns, sterile gloves, sterile drape, hand hygiene and skin antiseptic. A timeout was performed prior to the initiation of the procedure. Ultrasound was used to interrogate the left submandibular space. Markedly enlarged lymph node measuring 3.5 x 1.7 x 3.2 cm. A skin entry  site was selected and marked. The region was sterilely prepped and draped in the standard fashion using chlorhexidine skin prep. Local anesthesia was attained by infiltration with 1% lidocaine. A small dermatotomy was made. Under real-time ultrasound guidance, multiple 18 gauge core biopsies were obtained coaxially with the Bard mission automated biopsy device. Biopsy specimens were placed in saline and delivered to pathology for further analysis. Post biopsy ultrasound imaging demonstrates no evidence of immediate complication. The patient tolerated the procedure well. IMPRESSION: Ultrasound-guided core biopsy of left submandibular lymph node. Electronically Signed   By: Jacqulynn Cadet M.D.   On: 08/30/2020 17:19     All questions were answered. The patient knows to call the clinic with any problems, questions or concerns. I spent 30 minutes in the care of this patient including H and P, review of records, counseling and coordination of care. Have reviewed his PET/CT, CT imaging, ENT evaluation reports, pathology and discussed the plan with Dr. Constance Holster.    Benay Pike, MD 09/13/2020 5:27 PM

## 2020-09-25 ENCOUNTER — Other Ambulatory Visit (HOSPITAL_COMMUNITY): Payer: BC Managed Care – PPO

## 2020-10-01 ENCOUNTER — Other Ambulatory Visit: Payer: Self-pay | Admitting: Family

## 2020-10-01 DIAGNOSIS — Z794 Long term (current) use of insulin: Secondary | ICD-10-CM

## 2020-10-01 DIAGNOSIS — E1165 Type 2 diabetes mellitus with hyperglycemia: Secondary | ICD-10-CM

## 2020-10-04 DIAGNOSIS — C801 Malignant (primary) neoplasm, unspecified: Secondary | ICD-10-CM | POA: Diagnosis not present

## 2020-10-04 DIAGNOSIS — C7989 Secondary malignant neoplasm of other specified sites: Secondary | ICD-10-CM | POA: Diagnosis not present

## 2020-10-15 NOTE — H&P (Addendum)
Chief complaint: Neck mass.  HPI: He noticed a neck mass couple of months ago. He has had a needle biopsy of this which is pending. That was done on Friday. He had ultrasound but no other imaging yet. He is not a smoker or drinker. He denies any sore throat, weight loss, trouble swallowing. He feels fine otherwise. He does have CPAP. Needle biopsy P16 + poorly differentiated carcinoma. CT and PET negative for primary site and distant mets.  PMH/Meds/All/SocHx/FamHx/ROS:   History reviewed. No pertinent past medical history.  Past Surgical History:  Procedure Laterality Date   TOTAL SHOULDER ARTHROPLASTY Left   No family history of bleeding disorders, wound healing problems or difficulty with anesthesia.   Social History   Socioeconomic History   Marital status: Married  Spouse name: Not on file   Number of children: Not on file   Years of education: Not on file   Highest education level: Not on file  Occupational History   Not on file  Tobacco Use   Smoking status: Never Smoker   Smokeless tobacco: Never Used  Substance and Sexual Activity   Alcohol use: Not on file   Drug use: Not on file   Sexual activity: Not Currently  Other Topics Concern   Not on file  Social History Narrative   Not on file   Social Determinants of Health   Financial Resource Strain: Not on file  Food Insecurity: Not on file  Transportation Needs: Not on file  Physical Activity: Not on file  Stress: Not on file  Social Connections: Not on file  Housing Stability: Not on file   Current Outpatient Medications:   amLODIPine (NORVASC) 5 MG tablet, amlodipine 5 mg tablet TAKE 1 TABLET BY MOUTH ONCE DAILY, Disp: , Rfl:   dulaglutide (TRULICITY) [id number] [id number] mL subcutaneous pen, Trulicity A999333 99991111 mL subcutaneous pen injector, Disp: , Rfl:   lisinopriL-hydrochlorothiazide (PRINZIDE,ZESTORETIC) 20-12.5 mg per tablet, lisinopril 20 mg-hydrochlorothiazide 12.5 mg tablet TAKE 2 TABLETS BY MOUTH ONCE  DAILY, Disp: , Rfl:   metFORMIN XR (GLUCOPHAGE-XR) 750 MG 24 hr tablet, metformin ER 750 mg tablet,extended release 24 hr, Disp: , Rfl:   rosuvastatin (CRESTOR) 20 MG tablet, rosuvastatin 20 mg tablet TAKE 1 TABLET BY MOUTH ONCE DAILY, Disp: , Rfl:   A complete ROS was performed with pertinent positives/negatives noted in the HPI. The remainder of the ROS are negative.   Physical Exam:   BP 132/77  Pulse 71  Temp 98.3 F (36.8 C)  Ht 1.778 m ('5\' 10"'$ )  Wt 125.4 kg (276 lb 6.4 oz)  BMI 39.66 kg/m   General: Healthy and alert, in no distress, breathing easily. Normal affect. In a pleasant mood. Head: Normocephalic, atraumatic. No masses, or scars. Eyes: Pupils are equal, and reactive to light. Vision is grossly intact. No spontaneous or gaze nystagmus. Ears: Ear canals are cleaned of bilateral cerumen impaction. Tympanic membranes are intact, with normal landmarks and the middle ears are clear and healthy. Hearing: Grossly normal. Nose: Nasal cavities are clear with healthy mucosa, no polyps or exudate. Airways are patent. Face: No masses or scars, facial nerve function is symmetric. Oral Cavity: No mucosal abnormalities are noted. Tongue with normal mobility. Dentition appears healthy. Oropharynx: Tonsils are absent. There are no mucosal masses identified. Tongue base appears normal and healthy. Larynx/Hypopharynx: Indirect exam clear but somewhat limited. Chest: Deferred Neck: 4 cm tender node left side, just below the submandibular gland. Otherwise, no thyroid nodules or enlargement. Neuro: Cranial nerves II-XII  with normal function. Balance: Normal gate. Other findings: none.  Independent Review of Additional Tests or Records:  none  Procedures:  Procedure note: Flexible fiberoptic laryngoscopy  Details of the procedure were explained to the patient and all questions were answered.   Procedure:   After anesthetizing the nasal cavity with topical lidocaine and  oxymetazoline, the flexible endoscope was introduced and passed through the nasal cavity into the nasopharynx. The scope was then advanced to the level of the oropharynx, then the hypopharynx and larynx.   Findings:   The posterior soft palate, uvula, tongue base and vallecula were visualized and appeared healthy without mucosal masses or lesions. The epiglottis, aryepiglottic folds, hypopharynx, supraglottis, glottis were visualized and appeared healthy without mucosal masses or lesions. Vocal fold mobility was intact and symmetric.   Additional findings: There is a slight irregularity of the mucosa of the left vallecula but not an obvious mass.  The scope was withdrawn from the nose. He tolerated the procedure well.   Procedure note:  Indications: Cerumen impaction  Details of cerumen removal were discussed with the patient and all questions were answered.  Procedure:  Using the operating microscope, both sides were cleaned of cerumen using curettes. There was no signs of infection. Symptoms were releaved.  He tolerated this procedure well. There were no complications.  Impression & Plans:  1. Cerumen impaction, cleaned out. Avoid using Q-tips or anything else in his ears.  Suspicious cervical lymphadenopathy with a possible mass in the vallecula. Await the results of the fine-needle aspiration. PET scan is already scheduled. May need CT of the neck depending on the biopsy results. May need endoscopy with biopsy.

## 2020-10-18 NOTE — Pre-Procedure Instructions (Signed)
Surgical Instructions    Your procedure is scheduled on Wednesday, September 7th.  Report to [facility] Main Entrance "A" at 8:30 A.M., then check in with the Admitting office.  Call this number if you have problems the morning of surgery:  [phone]   If you have any questions prior to your surgery date call [phone]: Open Monday-Friday 8am-4pm    Remember:  Do not eat or drink after midnight the night before your surgery    Take these medicines the morning of surgery with A SIP OF WATER  amLODipine (NORVASC) rosuvastatin (CRESTOR)   As of today, STOP taking any Aspirin (unless otherwise instructed by your surgeon) Aleve, Naproxen, Ibuprofen, Motrin, Advil, Goody's, BC's, all herbal medications, fish oil, and all vitamins.  WHAT DO I DO ABOUT MY DIABETES MEDICATION?   Do not take metFORMIN (GLUCOPHAGE-XR) the morning of surgery.  The day of surgery, do not take other diabetes injectables, Trulicity (dulaglutide).   HOW TO MANAGE YOUR DIABETES BEFORE AND AFTER SURGERY  Why is it important to control my blood sugar before and after surgery? Improving blood sugar levels before and after surgery helps healing and can limit problems. A way of improving blood sugar control is eating a healthy diet by:  Eating less sugar and carbohydrates  Increasing activity/exercise  Talking with your doctor about reaching your blood sugar goals High blood sugars (greater than 180 mg/dL) can raise your risk of infections and slow your recovery, so you will need to focus on controlling your diabetes during the weeks before surgery. Make sure that the doctor who takes care of your diabetes knows about your planned surgery including the date and location.  How do I manage my blood sugar before surgery? Check your blood sugar at least 4 times a day, starting 2 days before surgery, to make sure that the level is not too high or low.  Check your blood sugar the morning of your surgery when you  wake up and every 2 hours until you get to the Short Stay unit.  If your blood sugar is less than 70 mg/dL, you will need to treat for low blood sugar: Do not take insulin. Treat a low blood sugar (less than 70 mg/dL) with  cup of clear juice (cranberry or apple), 4 glucose tablets, OR glucose gel. Recheck blood sugar in 15 minutes after treatment (to make sure it is greater than 70 mg/dL). If your blood sugar is not greater than 70 mg/dL on recheck, call [phone] for further instructions. Report your blood sugar to the short stay nurse when you get to Short Stay.  If you are admitted to the hospital after surgery: Your blood sugar will be checked by the staff and you will probably be given insulin after surgery (instead of oral diabetes medicines) to make sure you have good blood sugar levels. The goal for blood sugar control after surgery is 80-180 mg/dL.                      Do NOT Smoke (Tobacco/Vaping) or drink Alcohol 24 hours prior to your procedure.  If you use a CPAP at night, you may bring all equipment for your overnight stay.   Contacts, glasses, piercing's, hearing aid's, dentures or partials may not be worn into surgery, please bring cases for these belongings.    For patients admitted to the hospital, discharge time will be determined by your treatment team.   Patients discharged the day of surgery will  not be allowed to drive home, and someone needs to stay with them for 24 hours.  ONLY 1 SUPPORT PERSON MAY BE PRESENT WHILE YOU ARE IN SURGERY. IF YOU ARE TO BE ADMITTED ONCE YOU ARE IN YOUR ROOM YOU WILL BE ALLOWED TWO (2) VISITORS.  Minor children may have two parents present. Special consideration for safety and communication needs will be reviewed on a case by case basis.   Special instructions:   [facility]- Preparing For Surgery  Before surgery, you can play an important role. Because skin is not sterile, your skin needs to be as free of germs as possible. You  can reduce the number of germs on your skin by washing with CHG (chlorahexidine gluconate) Soap before surgery.  CHG is an antiseptic cleaner which kills germs and bonds with the skin to continue killing germs even after washing.    Oral Hygiene is also important to reduce your risk of infection.  Remember - BRUSH YOUR TEETH THE MORNING OF SURGERY WITH YOUR REGULAR TOOTHPASTE  Please do not use if you have an allergy to CHG or antibacterial soaps. If your skin becomes reddened/irritated stop using the CHG.  Do not shave (including legs and underarms) for at least 48 hours prior to first CHG shower. It is OK to shave your face.  Please follow these instructions carefully.   Shower the NIGHT BEFORE SURGERY and the MORNING OF SURGERY  If you chose to wash your hair, wash your hair first as usual with your normal shampoo.  After you shampoo, rinse your hair and body thoroughly to remove the shampoo.  Use CHG Soap as you would any other liquid soap. You can apply CHG directly to the skin and wash gently with a scrungie or a clean washcloth.   Apply the CHG Soap to your body ONLY FROM THE NECK DOWN.  Do not use on open wounds or open sores. Avoid contact with your eyes, ears, mouth and genitals (private parts). Wash Face and genitals (private parts)  with your normal soap.   Wash thoroughly, paying special attention to the area where your surgery will be performed.  Thoroughly rinse your body with warm water from the neck down.  DO NOT shower/wash with your normal soap after using and rinsing off the CHG Soap.  Pat yourself dry with a CLEAN TOWEL.  Wear CLEAN PAJAMAS to bed the night before surgery  Place CLEAN SHEETS on your bed the night before your surgery  DO NOT SLEEP WITH PETS.   Day of Surgery: Shower with CHG soap. Do not wear jewelry. Do not wear lotions, powders, colognes, or deodorant. Men may shave face and neck. Do not bring valuables to the hospital. [facility] is not  responsible for any belongings or valuables. Wear Clean/Comfortable clothing the morning of surgery Remember to brush your teeth WITH YOUR REGULAR TOOTHPASTE.   Please read over the following fact sheets that you were given.

## 2020-10-21 ENCOUNTER — Other Ambulatory Visit: Payer: Self-pay | Admitting: Family

## 2020-10-21 DIAGNOSIS — E1165 Type 2 diabetes mellitus with hyperglycemia: Secondary | ICD-10-CM

## 2020-10-21 DIAGNOSIS — Z794 Long term (current) use of insulin: Secondary | ICD-10-CM

## 2020-10-22 ENCOUNTER — Encounter (HOSPITAL_COMMUNITY)
Admission: RE | Admit: 2020-10-22 | Discharge: 2020-10-22 | Disposition: A | Discharge status: Home / Self Care | Payer: BC Managed Care – PPO | Source: Ambulatory Visit | Attending: Otolaryngology | Admitting: Otolaryngology

## 2020-10-22 ENCOUNTER — Other Ambulatory Visit: Payer: Self-pay

## 2020-10-22 ENCOUNTER — Encounter (HOSPITAL_COMMUNITY): Payer: Self-pay

## 2020-10-22 DIAGNOSIS — C779 Secondary and unspecified malignant neoplasm of lymph node, unspecified: Secondary | ICD-10-CM | POA: Diagnosis not present

## 2020-10-22 DIAGNOSIS — Z96612 Presence of left artificial shoulder joint: Secondary | ICD-10-CM | POA: Diagnosis not present

## 2020-10-22 DIAGNOSIS — Z79899 Other long term (current) drug therapy: Secondary | ICD-10-CM | POA: Diagnosis not present

## 2020-10-22 DIAGNOSIS — G4730 Sleep apnea, unspecified: Secondary | ICD-10-CM | POA: Diagnosis not present

## 2020-10-22 DIAGNOSIS — H6120 Impacted cerumen, unspecified ear: Secondary | ICD-10-CM | POA: Diagnosis not present

## 2020-10-22 DIAGNOSIS — Z01812 Encounter for preprocedural laboratory examination: Secondary | ICD-10-CM | POA: Insufficient documentation

## 2020-10-22 DIAGNOSIS — E785 Hyperlipidemia, unspecified: Secondary | ICD-10-CM | POA: Diagnosis not present

## 2020-10-22 DIAGNOSIS — C7989 Secondary malignant neoplasm of other specified sites: Secondary | ICD-10-CM | POA: Diagnosis not present

## 2020-10-22 DIAGNOSIS — I10 Essential (primary) hypertension: Secondary | ICD-10-CM | POA: Diagnosis not present

## 2020-10-22 DIAGNOSIS — Z20822 Contact with and (suspected) exposure to covid-19: Secondary | ICD-10-CM | POA: Insufficient documentation

## 2020-10-22 DIAGNOSIS — C4442 Squamous cell carcinoma of skin of scalp and neck: Secondary | ICD-10-CM | POA: Diagnosis not present

## 2020-10-22 DIAGNOSIS — C770 Secondary and unspecified malignant neoplasm of lymph nodes of head, face and neck: Secondary | ICD-10-CM | POA: Diagnosis not present

## 2020-10-22 DIAGNOSIS — C801 Malignant (primary) neoplasm, unspecified: Secondary | ICD-10-CM | POA: Diagnosis not present

## 2020-10-22 DIAGNOSIS — C01 Malignant neoplasm of base of tongue: Secondary | ICD-10-CM | POA: Diagnosis not present

## 2020-10-22 DIAGNOSIS — E119 Type 2 diabetes mellitus without complications: Secondary | ICD-10-CM | POA: Diagnosis not present

## 2020-10-22 DIAGNOSIS — Z7984 Long term (current) use of oral hypoglycemic drugs: Secondary | ICD-10-CM | POA: Diagnosis not present

## 2020-10-22 HISTORY — DX: Malignant (primary) neoplasm, unspecified: C80.1

## 2020-10-22 LAB — BASIC METABOLIC PANEL
Anion gap: 8 (ref 5–15)
BUN: 14 mg/dL (ref 6–20)
CO2: 28 mmol/L (ref 22–32)
Calcium: 9.6 mg/dL (ref 8.9–10.3)
Chloride: 103 mmol/L (ref 98–111)
Creatinine, Ser: 1.08 mg/dL (ref 0.61–1.24)
GFR, Estimated: >60 mL/min (ref >60)
Glucose, Bld: 161 mg/dL — ABNORMAL HIGH (ref 70–99)
Potassium: 3.5 mmol/L (ref 3.5–5.1)
Sodium: 139 mmol/L (ref 135–145)

## 2020-10-22 LAB — CBC
HCT: 46.5 % (ref 39.0–52.0)
Hemoglobin: 15.9 g/dL (ref 13.0–17.0)
MCH: 29.6 pg (ref 26.0–34.0)
MCHC: 34.2 g/dL (ref 30.0–36.0)
MCV: 86.4 fL (ref 80.0–100.0)
Platelets: 263 10*3/uL (ref 150–400)
RBC: 5.38 MIL/uL (ref 4.22–5.81)
RDW: 12.5 % (ref 11.5–15.5)
WBC: 7.2 10*3/uL (ref 4.0–10.5)
nRBC: 0 % (ref 0.0–0.2)

## 2020-10-22 LAB — HEMOGLOBIN A1C
Hgb A1c MFr Bld: 6.8 % — ABNORMAL HIGH (ref 4.8–5.6)
Mean Plasma Glucose: 148.46 mg/dL

## 2020-10-22 LAB — SARS CORONAVIRUS 2 (TAT 6-24 HRS): SARS Coronavirus 2: NEGATIVE

## 2020-10-22 LAB — GLUCOSE, CAPILLARY: Glucose-Capillary: 180 mg/dL — ABNORMAL HIGH (ref 70–99)

## 2020-10-22 NOTE — Progress Notes (Signed)
PCP - Evelina Dun, FNP Cardiologist - denies  Chest x-ray - n/a EKG - 04/19/20 Stress Test - denies ECHO - denies Cardiac Cath - denies  Sleep Study - OSA+ CPAP - uses nightly  Fasting Blood Sugar - 110-130 Checks Blood Sugar 2 times a week CBG at PAT: 180 Last A1C recorded-6.2 on 01/30/20. Will collect A1C today  Blood Thinner Instructions: n/a Aspirin Instructions: n/a  COVID TEST- 10/22/20; done in PAT  Anesthesia review: Yes, history of difficult intubation.  Patient denies shortness of breath, fever, cough and chest pain at PAT appointment   All instructions explained to the patient, with a verbal understanding of the material. Patient agrees to go over the instructions while at home for a better understanding. Patient also instructed to self quarantine after being tested for COVID-19. The opportunity to ask questions was provided.

## 2020-10-22 NOTE — Anesthesia Preprocedure Evaluation (Addendum)
Anesthesia Evaluation  Patient identified by MRN, date of birth, ID band Patient awake    Reviewed: Allergy & Precautions, NPO status , Patient's Chart, lab work & pertinent test results  History of Anesthesia Complications (+) DIFFICULT AIRWAY  Airway Mallampati: III  TM Distance: <3 FB Neck ROM: Full  Mouth opening: Limited Mouth Opening  Dental no notable dental hx.    Pulmonary sleep apnea and Continuous Positive Airway Pressure Ventilation ,    Pulmonary exam normal breath sounds clear to auscultation       Cardiovascular hypertension, Normal cardiovascular exam Rhythm:Regular Rate:Normal     Neuro/Psych negative neurological ROS  negative psych ROS   GI/Hepatic negative GI ROS, Neg liver ROS,   Endo/Other  diabetes  Renal/GU negative Renal ROS  negative genitourinary   Musculoskeletal negative musculoskeletal ROS (+)   Abdominal   Peds negative pediatric ROS (+)  Hematology negative hematology ROS (+)   Anesthesia Other Findings   Reproductive/Obstetrics negative OB ROS                            Anesthesia Physical Anesthesia Plan  ASA: 3  Anesthesia Plan: General   Post-op Pain Management:    Induction: Intravenous  PONV Risk Score and Plan: 2 and Ondansetron, Dexamethasone and Treatment may vary due to age or medical condition  Airway Management Planned: Oral ETT and Video Laryngoscope Planned  Additional Equipment:   Intra-op Plan:   Post-operative Plan: Extubation in OR  Informed Consent: I have reviewed the patients History and Physical, chart, labs and discussed the procedure including the risks, benefits and alternatives for the proposed anesthesia with the patient or authorized representative who has indicated his/her understanding and acceptance.     Dental advisory given  Plan Discussed with: CRNA and Surgeon  Anesthesia Plan Comments: (PAT note by  Karoline Caldwell, PA-C: Difficult Airway Pt reports he had arthroscopic shoulder surgery 10+ years ago and was told he had a "small airway". He denies any perioperative complications.    Anesthesia records from shoulder arthroscopy done in 2003 reviewed. Per written remarks "DL by CRNA - decreased opening, poor visibility, no cords with cricoid pressure. 2nd DL and intubated by Dr. Winfred Leeds."  Elective glidescope used for left total shoulder 09/29/2018)       Anesthesia Quick Evaluation

## 2020-10-23 ENCOUNTER — Inpatient Hospital Stay (HOSPITAL_COMMUNITY)
Admission: RE | Admit: 2020-10-23 | Discharge: 2020-10-24 | DRG: 822 | Disposition: A | Discharge status: Home / Self Care | Payer: BC Managed Care – PPO | Attending: Otolaryngology | Admitting: Otolaryngology

## 2020-10-23 ENCOUNTER — Inpatient Hospital Stay (HOSPITAL_COMMUNITY): Payer: BC Managed Care – PPO | Admitting: Anesthesiology

## 2020-10-23 ENCOUNTER — Inpatient Hospital Stay (HOSPITAL_COMMUNITY): Payer: BC Managed Care – PPO | Admitting: Vascular Surgery

## 2020-10-23 ENCOUNTER — Encounter (HOSPITAL_COMMUNITY): Payer: Self-pay | Admitting: Otolaryngology

## 2020-10-23 ENCOUNTER — Other Ambulatory Visit: Payer: Self-pay

## 2020-10-23 ENCOUNTER — Encounter (HOSPITAL_COMMUNITY)
Admission: RE | Disposition: A | Discharge status: Home / Self Care | Payer: Self-pay | Source: Home / Self Care | Attending: Otolaryngology

## 2020-10-23 DIAGNOSIS — Z20822 Contact with and (suspected) exposure to covid-19: Secondary | ICD-10-CM | POA: Diagnosis present

## 2020-10-23 DIAGNOSIS — Z79899 Other long term (current) drug therapy: Secondary | ICD-10-CM

## 2020-10-23 DIAGNOSIS — I10 Essential (primary) hypertension: Secondary | ICD-10-CM | POA: Diagnosis present

## 2020-10-23 DIAGNOSIS — C801 Malignant (primary) neoplasm, unspecified: Secondary | ICD-10-CM | POA: Diagnosis present

## 2020-10-23 DIAGNOSIS — C770 Secondary and unspecified malignant neoplasm of lymph nodes of head, face and neck: Principal | ICD-10-CM | POA: Diagnosis present

## 2020-10-23 DIAGNOSIS — Z7984 Long term (current) use of oral hypoglycemic drugs: Secondary | ICD-10-CM | POA: Diagnosis not present

## 2020-10-23 DIAGNOSIS — G4730 Sleep apnea, unspecified: Secondary | ICD-10-CM | POA: Diagnosis present

## 2020-10-23 DIAGNOSIS — E119 Type 2 diabetes mellitus without complications: Secondary | ICD-10-CM | POA: Diagnosis present

## 2020-10-23 DIAGNOSIS — H6120 Impacted cerumen, unspecified ear: Secondary | ICD-10-CM | POA: Diagnosis present

## 2020-10-23 DIAGNOSIS — C7989 Secondary malignant neoplasm of other specified sites: Secondary | ICD-10-CM | POA: Diagnosis not present

## 2020-10-23 DIAGNOSIS — Z96612 Presence of left artificial shoulder joint: Secondary | ICD-10-CM | POA: Diagnosis present

## 2020-10-23 HISTORY — PX: ESOPHAGOSCOPY: SHX5534

## 2020-10-23 HISTORY — PX: DIRECT LARYNGOSCOPY: SHX5326

## 2020-10-23 HISTORY — PX: RADICAL NECK DISSECTION: SHX2284

## 2020-10-23 LAB — GLUCOSE, CAPILLARY
Glucose-Capillary: 126 mg/dL — ABNORMAL HIGH (ref 70–99)
Glucose-Capillary: 163 mg/dL — ABNORMAL HIGH (ref 70–99)
Glucose-Capillary: 181 mg/dL — ABNORMAL HIGH (ref 70–99)

## 2020-10-23 SURGERY — LARYNGOSCOPY, DIRECT
Anesthesia: General | Site: Neck

## 2020-10-23 MED ORDER — MIDAZOLAM HCL 5 MG/5ML IJ SOLN
INTRAMUSCULAR | Status: DC | PRN
  Administered 2020-10-23: 2 mg via INTRAVENOUS

## 2020-10-23 MED ORDER — HYDROMORPHONE HCL 1 MG/ML IJ SOLN
INTRAMUSCULAR | Status: AC
  Filled 2020-10-23: qty 0.5

## 2020-10-23 MED ORDER — ONDANSETRON HCL 4 MG/2ML IJ SOLN: 4.0000 mg | INTRAMUSCULAR | Status: DC | PRN

## 2020-10-23 MED ORDER — ACETAMINOPHEN 10 MG/ML IV SOLN: 1000.0000 mg | Freq: Once | INTRAVENOUS | Status: DC | PRN

## 2020-10-23 MED ORDER — PHENYLEPHRINE 40 MCG/ML (10ML) SYRINGE FOR IV PUSH (FOR BLOOD PRESSURE SUPPORT)
PREFILLED_SYRINGE | INTRAVENOUS | Status: AC
  Filled 2020-10-23: qty 10

## 2020-10-23 MED ORDER — ONDANSETRON HCL 4 MG PO TABS: 4.0000 mg | ORAL_TABLET | ORAL | Status: DC | PRN

## 2020-10-23 MED ORDER — CHLORHEXIDINE GLUCONATE 0.12 % MT SOLN
15.0000 mL | Freq: Once | OROMUCOSAL | Status: AC
  Administered 2020-10-23: 15 mL via OROMUCOSAL
  Filled 2020-10-23: qty 15

## 2020-10-23 MED ORDER — PROPOFOL 10 MG/ML IV BOLUS
INTRAVENOUS | Status: DC | PRN
  Administered 2020-10-23: 170 mg via INTRAVENOUS

## 2020-10-23 MED ORDER — AMLODIPINE BESYLATE 5 MG PO TABS
5.0000 mg | ORAL_TABLET | Freq: Every day | ORAL | Status: DC
  Administered 2020-10-24: 5 mg via ORAL
  Filled 2020-10-23: qty 1

## 2020-10-23 MED ORDER — PHENYLEPHRINE HCL (PRESSORS) 10 MG/ML IV SOLN
INTRAVENOUS | Status: DC | PRN
  Administered 2020-10-23: 80 ug via INTRAVENOUS
  Administered 2020-10-23: 120 ug via INTRAVENOUS
  Administered 2020-10-23: 80 ug via INTRAVENOUS

## 2020-10-23 MED ORDER — EPHEDRINE SULFATE 50 MG/ML IJ SOLN
INTRAMUSCULAR | Status: DC | PRN
  Administered 2020-10-23 (×2): 10 mg via INTRAVENOUS

## 2020-10-23 MED ORDER — LISINOPRIL-HYDROCHLOROTHIAZIDE 20-12.5 MG PO TABS: 2.0000 | ORAL_TABLET | Freq: Every day | ORAL | Status: DC

## 2020-10-23 MED ORDER — ROCURONIUM BROMIDE 100 MG/10ML IV SOLN
INTRAVENOUS | Status: DC | PRN
  Administered 2020-10-23: 50 mg via INTRAVENOUS
  Administered 2020-10-23: 20 mg via INTRAVENOUS
  Administered 2020-10-23: 50 mg via INTRAVENOUS
  Administered 2020-10-23: 20 mg via INTRAVENOUS

## 2020-10-23 MED ORDER — EPINEPHRINE HCL (NASAL) 0.1 % NA SOLN
NASAL | Status: AC
  Filled 2020-10-23: qty 30

## 2020-10-23 MED ORDER — EPHEDRINE 5 MG/ML INJ
INTRAVENOUS | Status: AC
  Filled 2020-10-23: qty 5

## 2020-10-23 MED ORDER — HYDROMORPHONE HCL 1 MG/ML IJ SOLN
0.2500 mg | INTRAMUSCULAR | Status: DC | PRN
  Administered 2020-10-23: 0.25 mg via INTRAVENOUS

## 2020-10-23 MED ORDER — DEXAMETHASONE SODIUM PHOSPHATE 10 MG/ML IJ SOLN
INTRAMUSCULAR | Status: DC | PRN
  Administered 2020-10-23: 10 mg via INTRAVENOUS

## 2020-10-23 MED ORDER — ONDANSETRON HCL 4 MG/2ML IJ SOLN: 4.0000 mg | Freq: Once | INTRAMUSCULAR | Status: DC | PRN

## 2020-10-23 MED ORDER — PROPOFOL 10 MG/ML IV BOLUS
INTRAVENOUS | Status: AC
  Filled 2020-10-23: qty 20

## 2020-10-23 MED ORDER — ONDANSETRON HCL 4 MG/2ML IJ SOLN
INTRAMUSCULAR | Status: AC
  Filled 2020-10-23: qty 2

## 2020-10-23 MED ORDER — ROCURONIUM BROMIDE 10 MG/ML (PF) SYRINGE
PREFILLED_SYRINGE | INTRAVENOUS | Status: AC
  Filled 2020-10-23: qty 10

## 2020-10-23 MED ORDER — SUGAMMADEX SODIUM 500 MG/5ML IV SOLN
INTRAVENOUS | Status: DC | PRN
  Administered 2020-10-23: 500 mg via INTRAVENOUS

## 2020-10-23 MED ORDER — HYDROMORPHONE HCL 1 MG/ML IJ SOLN
INTRAMUSCULAR | Status: DC | PRN
  Administered 2020-10-23: 1 mg via INTRAVENOUS

## 2020-10-23 MED ORDER — ASPIRIN EC 81 MG PO TBEC
81.0000 mg | DELAYED_RELEASE_TABLET | Freq: Every day | ORAL | Status: DC
  Administered 2020-10-23 – 2020-10-24 (×2): 81 mg via ORAL
  Filled 2020-10-23 (×2): qty 1

## 2020-10-23 MED ORDER — FENTANYL CITRATE (PF) 100 MCG/2ML IJ SOLN
INTRAMUSCULAR | Status: DC | PRN
  Administered 2020-10-23: 50 ug via INTRAVENOUS
  Administered 2020-10-23: 100 ug via INTRAVENOUS
  Administered 2020-10-23: 50 ug via INTRAVENOUS
  Administered 2020-10-23 (×2): 100 ug via INTRAVENOUS

## 2020-10-23 MED ORDER — FENTANYL CITRATE (PF) 250 MCG/5ML IJ SOLN
INTRAMUSCULAR | Status: AC
  Filled 2020-10-23: qty 5

## 2020-10-23 MED ORDER — METFORMIN HCL ER 750 MG PO TB24
750.0000 mg | ORAL_TABLET | Freq: Every day | ORAL | Status: DC
  Administered 2020-10-24: 750 mg via ORAL
  Filled 2020-10-23: qty 1

## 2020-10-23 MED ORDER — BACITRACIN ZINC 500 UNIT/GM EX OINT
1.0000 "application " | TOPICAL_OINTMENT | Freq: Three times a day (TID) | CUTANEOUS | Status: DC
  Administered 2020-10-23 – 2020-10-24 (×2): 1 via TOPICAL

## 2020-10-23 MED ORDER — HYDROMORPHONE HCL 1 MG/ML IJ SOLN
INTRAMUSCULAR | Status: AC
  Filled 2020-10-23: qty 1

## 2020-10-23 MED ORDER — HYDROCODONE-ACETAMINOPHEN 5-325 MG PO TABS
1.0000 | ORAL_TABLET | ORAL | Status: DC | PRN
  Administered 2020-10-23: 2 via ORAL
  Administered 2020-10-24: 1 via ORAL
  Filled 2020-10-23: qty 1
  Filled 2020-10-23: qty 2

## 2020-10-23 MED ORDER — POTASSIUM CHLORIDE 2 MEQ/ML IV SOLN
INTRAVENOUS | Status: DC
  Filled 2020-10-23 (×2): qty 1000

## 2020-10-23 MED ORDER — LACTATED RINGERS IV SOLN: INTRAVENOUS | Status: DC

## 2020-10-23 MED ORDER — LIDOCAINE 2% (20 MG/ML) 5 ML SYRINGE
INTRAMUSCULAR | Status: AC
  Filled 2020-10-23: qty 5

## 2020-10-23 MED ORDER — ONDANSETRON HCL 4 MG/2ML IJ SOLN
INTRAMUSCULAR | Status: DC | PRN
Start: 2020-10-23 — End: 2020-10-23
  Administered 2020-10-23: 4 mg via INTRAVENOUS

## 2020-10-23 MED ORDER — SUCCINYLCHOLINE CHLORIDE 200 MG/10ML IV SOSY
PREFILLED_SYRINGE | INTRAVENOUS | Status: AC
  Filled 2020-10-23: qty 10

## 2020-10-23 MED ORDER — MIDAZOLAM HCL 2 MG/2ML IJ SOLN
INTRAMUSCULAR | Status: AC
  Filled 2020-10-23: qty 2

## 2020-10-23 MED ORDER — LISINOPRIL 40 MG PO TABS
40.0000 mg | ORAL_TABLET | Freq: Every day | ORAL | Status: DC
  Administered 2020-10-23 – 2020-10-24 (×2): 40 mg via ORAL
  Filled 2020-10-23 (×2): qty 1

## 2020-10-23 MED ORDER — ROSUVASTATIN CALCIUM 20 MG PO TABS
20.0000 mg | ORAL_TABLET | Freq: Every day | ORAL | Status: DC
  Administered 2020-10-24: 20 mg via ORAL
  Filled 2020-10-23: qty 1

## 2020-10-23 MED ORDER — DEXAMETHASONE SODIUM PHOSPHATE 10 MG/ML IJ SOLN
INTRAMUSCULAR | Status: AC
  Filled 2020-10-23: qty 1

## 2020-10-23 MED ORDER — BACITRACIN ZINC 500 UNIT/GM EX OINT
TOPICAL_OINTMENT | CUTANEOUS | Status: AC
  Filled 2020-10-23: qty 28.35

## 2020-10-23 MED ORDER — ORAL CARE MOUTH RINSE
15.0000 mL | Freq: Once | OROMUCOSAL | Status: AC
Start: 2020-10-23 — End: 2020-10-23

## 2020-10-23 MED ORDER — BACITRACIN ZINC 500 UNIT/GM EX OINT
TOPICAL_OINTMENT | CUTANEOUS | Status: DC | PRN
  Administered 2020-10-23: 1 via TOPICAL

## 2020-10-23 MED ORDER — SUCCINYLCHOLINE CHLORIDE 200 MG/10ML IV SOSY
PREFILLED_SYRINGE | INTRAVENOUS | Status: DC | PRN
  Administered 2020-10-23: 200 mg via INTRAVENOUS

## 2020-10-23 MED ORDER — LIDOCAINE HCL (CARDIAC) PF 100 MG/5ML IV SOSY
PREFILLED_SYRINGE | INTRAVENOUS | Status: DC | PRN
  Administered 2020-10-23: 100 mg via INTRAVENOUS

## 2020-10-23 MED ORDER — HYDROCHLOROTHIAZIDE 25 MG PO TABS
25.0000 mg | ORAL_TABLET | Freq: Every day | ORAL | Status: DC
  Administered 2020-10-23 – 2020-10-24 (×2): 25 mg via ORAL
  Filled 2020-10-23 (×2): qty 1

## 2020-10-23 MED ORDER — SUGAMMADEX SODIUM 500 MG/5ML IV SOLN
INTRAVENOUS | Status: AC
  Filled 2020-10-23: qty 5

## 2020-10-23 MED ORDER — 0.9 % SODIUM CHLORIDE (POUR BTL) OPTIME
TOPICAL | Status: DC | PRN
  Administered 2020-10-23: 1000 mL

## 2020-10-23 MED ORDER — INSULIN ASPART 100 UNIT/ML IJ SOLN
0.0000 [IU] | Freq: Three times a day (TID) | INTRAMUSCULAR | Status: DC
  Administered 2020-10-24: 2 [IU] via SUBCUTANEOUS

## 2020-10-23 SURGICAL SUPPLY — 60 items
APPLIER CLIP 9.375 SM OPEN (CLIP) ×3
BAG COUNTER SPONGE SURGICOUNT (BAG) ×3 IMPLANT
BLADE SURG 10 STRL SS (BLADE) ×3 IMPLANT
CANISTER SUCT 3000ML PPV (MISCELLANEOUS) ×3 IMPLANT
CLEANER TIP ELECTROSURG 2X2 (MISCELLANEOUS) ×3 IMPLANT
CLIP APPLIE 9.375 SM OPEN (CLIP) ×2 IMPLANT
CNTNR URN SCR LID CUP LEK RST (MISCELLANEOUS) ×4 IMPLANT
CONT SPEC 4OZ STRL OR WHT (MISCELLANEOUS) ×6
CORD BIPOLAR FORCEPS 12FT (ELECTRODE) ×3 IMPLANT
COVER BACK TABLE 60X90IN (DRAPES) ×3 IMPLANT
COVER MAYO STAND STRL (DRAPES) ×3 IMPLANT
COVER SURGICAL LIGHT HANDLE (MISCELLANEOUS) ×3 IMPLANT
DRAIN CHANNEL 15F RND FF W/TCR (WOUND CARE) IMPLANT
DRAIN JP 10F RND RADIO (DRAIN) IMPLANT
DRAIN JP 15F RND RADIO PRF (DRAIN) ×3 IMPLANT
DRAIN SNY 10 ROU (WOUND CARE) IMPLANT
DRAIN WOUND SNY 15 RND (WOUND CARE) IMPLANT
DRAPE HALF SHEET 40X57 (DRAPES) ×3 IMPLANT
DRSG TELFA 3X8 NADH (GAUZE/BANDAGES/DRESSINGS) ×3 IMPLANT
ELECT COATED BLADE 2.86 ST (ELECTRODE) ×3 IMPLANT
ELECT REM PT RETURN 9FT ADLT (ELECTROSURGICAL) ×3
ELECTRODE REM PT RTRN 9FT ADLT (ELECTROSURGICAL) ×2 IMPLANT
EVACUATOR SILICONE 100CC (DRAIN) ×3 IMPLANT
FORCEPS BIPOLAR SPETZLER 8 1.0 (NEUROSURGERY SUPPLIES) ×3 IMPLANT
GAUZE 4X4 16PLY ~~LOC~~+RFID DBL (SPONGE) ×21 IMPLANT
GLOVE SURG ENC MOIS LTX SZ6.5 (GLOVE) ×3 IMPLANT
GLOVE SURG LTX SZ7.5 (GLOVE) ×3 IMPLANT
GOWN STRL REUS W/ TWL LRG LVL3 (GOWN DISPOSABLE) ×4 IMPLANT
GOWN STRL REUS W/TWL LRG LVL3 (GOWN DISPOSABLE) ×6
GUARD TEETH (MISCELLANEOUS) ×3 IMPLANT
KIT BASIN OR (CUSTOM PROCEDURE TRAY) ×3 IMPLANT
KIT TURNOVER KIT B (KITS) ×3 IMPLANT
NEEDLE PRECISIONGLIDE 27X1.5 (NEEDLE) ×3 IMPLANT
NS IRRIG 1000ML POUR BTL (IV SOLUTION) ×3 IMPLANT
PAD ARMBOARD 7.5X6 YLW CONV (MISCELLANEOUS) ×6 IMPLANT
PENCIL FOOT CONTROL (ELECTRODE) ×3 IMPLANT
SHEARS HARMONIC 9CM CVD (BLADE) ×3 IMPLANT
SPECIMEN JAR MEDIUM (MISCELLANEOUS) IMPLANT
SPECIMEN JAR SMALL (MISCELLANEOUS) IMPLANT
SPONGE INTESTINAL PEANUT (DISPOSABLE) ×3 IMPLANT
SPONGE T-LAP 18X18 ~~LOC~~+RFID (SPONGE) IMPLANT
STAPLER VISISTAT 35W (STAPLE) ×3 IMPLANT
SUT CHROMIC 3 0 SH 27 (SUTURE) ×9 IMPLANT
SUT CHROMIC 5 0 P 3 (SUTURE) IMPLANT
SUT ETHILON 2 0 FS 18 (SUTURE) ×3 IMPLANT
SUT ETHILON 3 0 PS 1 (SUTURE) IMPLANT
SUT ETHILON 5 0 PS 2 18 (SUTURE) IMPLANT
SUT SILK 2 0 REEL (SUTURE) IMPLANT
SUT SILK 3 0 SH CR/8 (SUTURE) ×3 IMPLANT
SUT SILK 4 0 REEL (SUTURE) ×6 IMPLANT
SUT VIC AB 3-0 SH 18 (SUTURE) ×3 IMPLANT
SYR CONTROL 10ML LL (SYRINGE) ×3 IMPLANT
SYR TB 1ML LUER SLIP (SYRINGE) IMPLANT
TOWEL GREEN STERILE (TOWEL DISPOSABLE) ×3 IMPLANT
TOWEL GREEN STERILE FF (TOWEL DISPOSABLE) ×3 IMPLANT
TRAY ENT MC OR (CUSTOM PROCEDURE TRAY) ×3 IMPLANT
TRAY FOLEY MTR SLVR 14FR STAT (SET/KITS/TRAYS/PACK) ×3 IMPLANT
TUBE CONNECTING 12X1/4 (SUCTIONS) ×3 IMPLANT
TUBE FEEDING 10FR FLEXIFLO (MISCELLANEOUS) IMPLANT
WATER STERILE IRR 1000ML POUR (IV SOLUTION) ×3 IMPLANT

## 2020-10-23 NOTE — Interval H&P Note (Signed)
History and Physical Interval Note:  10/23/2020 9:52 AM  Gregory Carey  has presented today for surgery, with the diagnosis of Metastatic squamous neck cancer with occult primary.  The various methods of treatment have been discussed with the patient and family. After consideration of risks, benefits and other options for treatment, the patient has consented to  Procedure(s): DIRECT LARYNGOSCOPY (N/A) ESOPHAGOSCOPY (N/A) LEFT NECK DISSECTION WITH BIOPSIES (Left) as a surgical intervention.  The patient's history has been reviewed, patient examined, no change in status, stable for surgery.  I have reviewed the patient's chart and labs.  Questions were answered to the patient's satisfaction.     Izora Gala

## 2020-10-23 NOTE — Anesthesia Postprocedure Evaluation (Signed)
Anesthesia Post Note  Patient: Gregory Carey  Procedure(s) Performed: DIRECT LARYNGOSCOPY WITH BIOPSIES (Mouth) ESOPHAGOSCOPY (Mouth) LEFT NECK DISSECTION (Left: Neck)     Patient location during evaluation: PACU Anesthesia Type: General Level of consciousness: awake and alert Pain management: pain level controlled Vital Signs Assessment: post-procedure vital signs reviewed and stable Respiratory status: spontaneous breathing, nonlabored ventilation, respiratory function stable and patient connected to nasal cannula oxygen Cardiovascular status: blood pressure returned to baseline and stable Postop Assessment: no apparent nausea or vomiting Anesthetic complications: no   No notable events documented.  Last Vitals:  Vitals:   10/23/20 1452 10/23/20 1507  BP: 119/80 124/74  Pulse: 63 62  Resp: 14 16  Temp:    SpO2: 93% 93%    Last Pain:  Vitals:   10/23/20 1437  TempSrc:   PainSc: 0-No pain                 [name]

## 2020-10-23 NOTE — Progress Notes (Signed)
ENT Post Operative Note  Subjective: Patient seen and examined at bedside. Stable since surgery. Tolerating diet without difficulty. No nausea or vomiting. Pain well controlled.  Vitals:   10/23/20 1537 10/23/20 1723  BP: 126/78 126/75  Pulse: 63 64  Resp: 16 16  Temp:  98.2 F (36.8 C)  SpO2: 94% 97%    OBJECTIVE  Gen: alert, cooperative, appropriate Head/ENT: EOMI, mucus membranes moist and pink, conjunctiva clear. Left neck incision C/D/I with staples in place. Neck soft, no evidence of seroma or hematoma. JP drain exiting medially with sanguinous drainage.  Face moves symmetrically, CN exam wnl Respiratory: Voice without dysphonia. non-labored breathing, no accessory muscle use, normal HR, good O2 saturations  ASSESSMENT/ PLAN  Gregory Carey is a 60 y.o. male who is POD 0 s/p  direct laryngoscopy, esophagoscopy, left neck dissection  -Pain control -Continue home meds -Maintain JP drain to bulb suction -Continue to monitor  Thank you for allowing me to participate in the care of this patient. Please do not hesitate to contact me with any questions or concerns.   Jason Coop, [facility] Cell: [phone]

## 2020-10-23 NOTE — Op Note (Signed)
OPERATIVE REPORT  DATE OF SURGERY: 10/23/2020  PATIENT:  Gregory Carey,  60 y.o. male  PRE-OPERATIVE DIAGNOSIS:  Metastatic squamous neck cancer with occult primary  POST-OPERATIVE DIAGNOSIS:  Metastatic squamous neck cancer with occult primary  PROCEDURE:  Procedure(s): DIRECT LARYNGOSCOPY WITH BIOPSIES ESOPHAGOSCOPY LEFT NECK DISSECTION  SURGEON:  Beckie Salts, MD  ASSISTANTS: RNFA  ANESTHESIA:   General   EBL: 200 ml  DRAINS: 15 French round  LOCAL MEDICATIONS USED:  None  SPECIMEN: 1.  Left base of tongue biopsy: 2.  Left tonsil biopsy 3.  Left modified neck dissection including levels 1 through 5.  COUNTS:  Correct  PROCEDURE DETAILS: The patient was taken to the operating room and placed on the operating table in the supine position. Following induction of general endotracheal anesthesia, the table was turned 90 degrees and the patient was draped in a standard fashion for endoscopy. For protection reviewed in endoscopy.  1.  Direct laryngoscopy with biopsies.  Jako laryngoscope was used to visualize the oropharynx hypopharynx and larynx..  There were no pathologic findings.  Random biopsies taken from the left tongue base and left tonsil.  2.  Esophagoscopy.  Rigid cervical esophagoscope was passed into the oral cavity through the esophageal introitus and advanced into the cervical esophagus.  The scope was slowly withdrawn while suctioning secretions, the esophageal wall was inspected circumferentially.  No lesions were identified.  3.  Left modified radical neck dissection involving levels 1 through 5.  Incision was outlined with a marking pen starting at the mastoid tip.  Down towards the level of the cricoid.  Electrocautery was used to incise the skin and subcutaneous tissue and through the platysma layer.  Subplatysmal flaps were developed superiorly to the mandible and anteriorly the clavicle.  The upper skin flap was secured with a moist Ray-Tec and stay  sutures.  The great auricular nerve was identified.  The external jugular vein was ligated and all fibrofatty tissue anterior to the greater regular nerve was dissected off the lateral aspect of the sternocleidomastoid muscle..  Fascia was then dissected off the medial side.  At the level of the large metastatic node of the sternocleidomastoid muscle was taken with the specimen as margin.  The remainder of the muscle was preserved.  The lymph node bearing tissue was brought forward exposing the spinal accessory nerve.  This was dissected up towards the skull base.  At the upper jugular area.  The fibrofatty tissue posterior lateral to the nerve was dissected free down to the levator scapular and the splenius capitis fascia.  This was then brought under the spinal accessory nerve.  Dissection continued forward towards the internal jugular vein.  The lymph node deposit was adhering to the midportion of the vein so the decision was made to sacrifice the vein.  The upper and lower stumps were isolated between clamps making sure the vagus nerve was left below.  The vein was divided.  It was doubly ligated with 3-0 stick tie and 2-10 free tie of the upper and lower stumps.  A vein and surrounding lymph node bearing tissue was then dissected forward leaving the entire carotid system and vagus nerve intact.  In the inferior posterior triangle there was a suspicious node identified.  This was carefully dissected with surrounding fibrofatty tissue and expected lymphatics were ligated with 4-0 silk ties.  This level 5.  Specimen was sent with the main specimen.  The anterior triangle was dissected.  Fibrofatty tissue between the anterior  digastric bellies was brought inferiorly and posteriorly.  The mylohyoid was applied and reflected anteriorly.  The submandibular gland was brought to inferiorly.  The marginal branch of the facial nerve was identified and reflected superiorly.  Facial vessels were ligated between clamps  and divided.  Submandibular ganglion and the duct were ligated between clamps and divided.  Facial artery was identified and entered as well.  Hypoglossal nerve was identified and preserved. Veins were sacrificed.  The specimen was delivered and oriented for pathology.  The wound was irrigated with saline.  There is no evidence of further bleeding or chyle leak.  46 French round JP drain was left in the neck and exited out separate from over stab incision secured in place with nylon suture.  The platysma layer was reapproximated with 3-0 chromic interrupted.  The skin was stapled.  Patient was awakened extubated and transferred to recovery in stable condition.   PATIENT DISPOSITION:  To PACU, stable

## 2020-10-23 NOTE — Transfer of Care (Signed)
Immediate Anesthesia Transfer of Care Note  Patient: Gregory Carey  Procedure(s) Performed: DIRECT LARYNGOSCOPY WITH BIOPSIES (Mouth) ESOPHAGOSCOPY (Mouth) LEFT NECK DISSECTION (Left: Neck)  Patient Location: PACU  Anesthesia Type:General  Level of Consciousness: awake, alert , oriented and patient cooperative  Airway & Oxygen Therapy: Patient Spontanous Breathing and Patient connected to face mask oxygen  Post-op Assessment: Report given to RN, Post -op Vital signs reviewed and stable and Patient moving all extremities X 4  Post vital signs: Reviewed and stable  Last Vitals:  Vitals Value Taken Time  BP 114/61 10/23/20 1437  Temp 37.6 C 10/23/20 1407  Pulse 60 10/23/20 1439  Resp 19 10/23/20 1439  SpO2 94 % 10/23/20 1439  Vitals shown include unvalidated device data.  Last Pain:  Vitals:   10/23/20 1407  TempSrc:   PainSc: 0-No pain         Complications: No notable events documented.

## 2020-10-23 NOTE — Anesthesia Procedure Notes (Signed)
Procedure Name: Intubation Date/Time: 10/23/2020 10:45 AM Performed by: Jonna Munro, CRNA Pre-anesthesia Checklist: Patient identified, Emergency Drugs available, Suction available, Patient being monitored and Timeout performed Patient Re-evaluated:Patient Re-evaluated prior to induction Oxygen Delivery Method: Circle system utilized Preoxygenation: Pre-oxygenation with 100% oxygen Induction Type: IV induction and Rapid sequence Laryngoscope Size: Mac, Glidescope and 3 Grade View: Grade I Tube type: Oral Tube size: 7.5 mm Number of attempts: 2 (first attempt ETT cuff punctured on teeth, removed and replaced easily) Airway Equipment and Method: Stylet and Video-laryngoscopy Placement Confirmation: ETT inserted through vocal cords under direct vision, positive ETCO2 and breath sounds checked- equal and bilateral Secured at: 23 cm Tube secured with: Tape Dental Injury: Teeth and Oropharynx as per pre-operative assessment  Difficulty Due To: Difficulty was anticipated, Difficult Airway- due to large tongue and Difficult Airway- due to limited oral opening

## 2020-10-24 ENCOUNTER — Encounter (HOSPITAL_COMMUNITY): Payer: Self-pay | Admitting: Otolaryngology

## 2020-10-24 LAB — GLUCOSE, CAPILLARY: Glucose-Capillary: 147 mg/dL — ABNORMAL HIGH (ref 70–99)

## 2020-10-24 MED ORDER — ONDANSETRON 8 MG PO TBDP: 8.0000 mg | ORAL_TABLET | Freq: Three times a day (TID) | ORAL | 0 refills | Status: DC | PRN

## 2020-10-24 MED ORDER — HYDROCODONE-ACETAMINOPHEN 7.5-325 MG PO TABS: 1.0000 | ORAL_TABLET | Freq: Four times a day (QID) | ORAL | 0 refills | Status: DC | PRN

## 2020-10-24 NOTE — Progress Notes (Signed)
Patient given discharge instructions and stated understanding. Patient leaving via wheel chair.

## 2020-10-24 NOTE — Plan of Care (Signed)

## 2020-10-24 NOTE — Discharge Instructions (Signed)
Avoid any straining or heavy lifting.

## 2020-10-24 NOTE — Plan of Care (Signed)

## 2020-10-24 NOTE — Discharge Summary (Signed)
Physician Discharge Summary  Patient ID: Gregory Carey MRN: [id number] DOB/AGE: 1960/09/06 60 y.o.  Admit date: 10/23/2020 Discharge date: 10/24/2020  Admission Diagnoses: Squamous cell carcinoma metastatic to cervical lymph node  Discharge Diagnoses:  Active Problems:   Metastatic cancer to cervical lymph nodes ([place])   Discharged Condition: good  Hospital Course: No complications  Consults: none  Significant Diagnostic Studies: none  Treatments: surgery: Direct laryngoscopy with biopsy, esophagoscopy, modified radical neck dissection  Discharge Exam: Blood pressure (!) 144/84, pulse 67, temperature 97.8 F (36.6 C), temperature source Oral, resp. rate 17, height 5' 10"  (1.778 m), weight 124.6 kg, SpO2 96 %. PHYSICAL EXAM: Awake and alert.  Breathing and voice are normal.  All cranial nerves are functioning normally.  Incision looks excellent.  Drain intact.  Disposition: Discharge disposition: 01-Home or Self Care       Discharge Instructions     Diet - low sodium heart healthy   Complete by: As directed    Discharge wound care:   Complete by: As directed    Keep the neck clean and dry.  Apply ointment to the incision and the drain site twice daily.  Empty the drain and record the amount 3 times daily and more if necessary.   Increase activity slowly   Complete by: As directed       Allergies as of 10/24/2020   No Known Allergies      Medication List     TAKE these medications    amLODipine 5 MG tablet Commonly known as: NORVASC Take 1 tablet (5 mg total) by mouth daily.   aspirin EC 81 MG tablet Take 1 tablet (81 mg total) by mouth daily.   blood glucose meter kit and supplies Dispense based on patient and insurance preference. Use up to four times daily as directed. (FOR ICD-9 250.00, 250.01).   Contour Next Test test strip Generic drug: glucose blood USE AS DIRECTED TWICE DAILY   HYDROcodone-acetaminophen 7.5-325 MG tablet Commonly known as:  Norco Take 1 tablet by mouth every 6 (six) hours as needed for moderate pain.   lisinopril-hydrochlorothiazide 20-12.5 MG tablet Commonly known as: ZESTORETIC Take 2 tablets by mouth daily.   metFORMIN 750 MG 24 hr tablet Commonly known as: GLUCOPHAGE-XR Take 1 tablet (750 mg total) by mouth daily with breakfast.   ondansetron 8 MG disintegrating tablet Commonly known as: Zofran ODT Take 1 tablet (8 mg total) by mouth every 8 (eight) hours as needed for nausea or vomiting.   rosuvastatin 20 MG tablet Commonly known as: CRESTOR Take 1 tablet (20 mg total) by mouth daily.   Trulicity 4.65 KC/1.2XN Sopn Generic drug: Dulaglutide INJECT 1 DOSE SUBCUTANEOUSLY ONCE A WEEK               Discharge Care Instructions  (From admission, onward)           Start     Ordered   10/24/20 0000  Discharge wound care:       Comments: Keep the neck clean and dry.  Apply ointment to the incision and the drain site twice daily.  Empty the drain and record the amount 3 times daily and more if necessary.   10/24/20 1700            Follow-up Information     Izora Gala, MD Follow up on 10/28/2020.   Specialty: Otolaryngology Why: Come to the office at 2 PM Contact information: [address] [place] [address] [phone]  Signed: Izora Gala 10/24/2020, 8:52 AM

## 2020-10-25 ENCOUNTER — Telehealth: Payer: Self-pay

## 2020-10-25 LAB — SURGICAL PATHOLOGY

## 2020-10-25 NOTE — Telephone Encounter (Signed)
Transition Care Management Follow-up Telephone Call Date of discharge and from where: 10/24/2020 from [facility] How have you been since you were released from the hospital? [place]. Pain 3/10 Any questions or concerns? No  Items Reviewed: Did the pt receive and understand the discharge instructions provided? Yes  Medications obtained and verified? Yes  Other? Yes  reports throat is sore but otherwise he is ok Any new allergies since your discharge? No  Dietary orders reviewed? Yes Do you have support at home? Yes   Home Care and Equipment/Supplies: Were home health services ordered? not applicable If so, what is the name of the agency?N/A Has the agency set up a time to come to the patient's home? not applicable Were any new equipment or medical supplies ordered?  No What is the name of the medical supply agency? N/A Were you able to get the supplies/equipment? not applicable Do you have any questions related to the use of the equipment or supplies? No  Functional Questionnaire: (I = Independent and D = Dependent) ADLs: I  Bathing/Dressing- I  Meal Prep- I  Eating- I  Maintaining continence- I  Transferring/Ambulation- I  Managing Meds- I  Follow up appointments reviewed:  PCP Hospital f/u appt confirmed? No  Specialist Hospital f/u appt confirmed? Yes  Scheduled to see dR. Rosen  on 10/28/2020 @ 2pm. Are transportation arrangements needed? No  If their condition worsens, is the pt aware to call PCP or go to the Emergency Dept.? Yes Was the patient provided with contact information for the PCP's office or ED? Yes Was to pt encouraged to call back with questions or concerns? Yes   Tomasa Rand, RN, BSN, CEN [facility] [employer] [phone]

## 2020-10-29 ENCOUNTER — Encounter: Payer: Self-pay | Admitting: Family

## 2020-10-29 ENCOUNTER — Ambulatory Visit (INDEPENDENT_AMBULATORY_CARE_PROVIDER_SITE_OTHER): Payer: BC Managed Care – PPO | Admitting: Family

## 2020-10-29 DIAGNOSIS — F411 Generalized anxiety disorder: Secondary | ICD-10-CM | POA: Diagnosis not present

## 2020-10-29 DIAGNOSIS — C770 Secondary and unspecified malignant neoplasm of lymph nodes of head, face and neck: Secondary | ICD-10-CM | POA: Diagnosis not present

## 2020-10-29 DIAGNOSIS — C779 Secondary and unspecified malignant neoplasm of lymph node, unspecified: Secondary | ICD-10-CM

## 2020-10-29 MED ORDER — BUSPIRONE HCL 5 MG PO TABS: 5.0000 mg | ORAL_TABLET | Freq: Three times a day (TID) | ORAL | 1 refills | Status: DC | PRN

## 2020-10-29 NOTE — Progress Notes (Signed)
   Virtual Visit  Note Due to COVID-19 pandemic this visit was conducted virtually. This visit type was conducted due to national recommendations for restrictions regarding the COVID-19 Pandemic (e.g. social distancing, sheltering in place) in an effort to limit this patient's exposure and mitigate transmission in our community. All issues noted in this document were discussed and addressed.  A physical exam was not performed with this format.  I connected with Gregory Carey on 10/29/20 at 1:05 pm  by video and verified that I am speaking with the correct person using two identifiers. Gregory Carey is currently located at home and wife  is currently with him  during visit. The provider, Evelina Dun, FNP is located in their office at time of visit.  I discussed the limitations, risks, security and privacy concerns of performing an evaluation and management service by video  and the availability of in person appointments. I also discussed with the patient that there may be a patient responsible charge related to this service. The patient expressed understanding and agreed to proceed.   History and Present Illness:  Pt presents today to discuss anxiety. He was recently diagnosed with metastatic squamous neck cancer. He had surgery on 10/23/20 for laryngoscopy and biopsy. He is scheduled for another surgery on his tongue.  Anxiety Presents for initial visit. Onset was 1 to 4 weeks ago. The problem has been waxing and waning. Symptoms include decreased concentration, depressed mood, excessive worry, irritability, malaise, nervous/anxious behavior, palpitations and restlessness. Symptoms occur most days. The severity of symptoms is moderate. Exacerbated by: health issue. The quality of sleep is good.       Review of Systems  Constitutional:  Positive for irritability.  Cardiovascular:  Positive for palpitations.  Psychiatric/Behavioral:  Positive for decreased concentration. The patient is  nervous/anxious.     Observations/Objective: No SOB or distress noted, sitting on cough, flat affect  Assessment and Plan: 1. GAD (generalized anxiety disorder) - busPIRone (BUSPAR) 5 MG tablet; Take 1 tablet (5 mg total) by mouth 3 (three) times daily as needed.  Dispense: 90 tablet; Refill: 1  2. Metastatic squamous cell carcinoma to lymph node ([place])  3. Metastatic cancer to cervical lymph nodes (HCC)   Will start Buspar 5 mg TID prn today, recommend he take every day Stress management A lot of stress that is situational at this time. Pt plans to stop Buspar once he is complete with surgeries and gets cleared from Oncologists.  Will follow up in 1 month and increase or change accordingly.       I discussed the assessment and treatment plan with the patient. The patient was provided an opportunity to ask questions and all were answered. The patient agreed with the plan and demonstrated an understanding of the instructions.   The patient was advised to call back or seek an in-person evaluation if the symptoms worsen or if the condition fails to improve as anticipated.  The above assessment and management plan was discussed with the patient. The patient verbalized understanding of and has agreed to the management plan. Patient is aware to call the clinic if symptoms persist or worsen. Patient is aware when to return to the clinic for a follow-up visit. Patient educated on when it is appropriate to go to the emergency department.   Time call ended:  1:14 pm   I provided 9 minutes of  face-to-face time during this encounter.    Evelina Dun, FNP

## 2020-10-30 ENCOUNTER — Telehealth: Payer: Self-pay | Admitting: Hematology and Oncology

## 2020-10-30 NOTE — Telephone Encounter (Signed)
Scheduled appt per 9/13 referral. Pt is aware of appt date and time.  

## 2020-10-31 NOTE — Progress Notes (Signed)
Oncology Nurse Navigator Documentation   Placed introductory call to new referral patient Gregory Carey Introduced myself as the H&N oncology nurse navigator that works with Dr. Isidore Moos and Dr. Chryl Heck to whom he has been referred by Dr. Constance Holster. He confirmed understanding of referral. Briefly explained my role as his navigator, provided my contact information.  Confirmed understanding of upcoming appts and [place] location, explained arrival and registration process. I explained the purpose of a dental evaluation prior to starting RT, indicated he would be contacted by [facility] to arrange an appt. He is scheduled to see Dr. Benson Norway on 11/18/20. I encouraged him to call with questions/concerns as he moves forward with appts and procedures.   He verbalized understanding of information provided, expressed appreciation for my call.   Navigator Initial Assessment Employment Status:he is employed Currently on FMLA / STD: no Living Situation: he lives with his wife.  Support System:wife, family PCP: Evelina Dun FNP [id number] Financial Concerns:no Transportation Needs: no Sensory Deficits:no Language Barriers/Interpreter Needed:  no Ambulation Needs: no DME Used in Home: no Psychosocial Needs:  no Concerns/Needs Understanding Cancer:  addressed/answered by navigator to best of ability Self-Expressed Needs: no   Harlow Asa RN, BSN, OCN Head & Neck Oncology Nurse [place] [facility] Phone # [phone]  Fax # [phone]

## 2020-10-31 NOTE — Progress Notes (Signed)
Radiation Oncology         [phone] ________________________________  Initial Outpatient Consultation  Name: Gregory Carey MRN: [id number]  Date: 11/01/2020  DOB: [birth year]  YD:XAJOI, Gregory Hawthorne, FNP  Izora Gala, MD   REFERRING PHYSICIAN: Izora Gala, MD  DIAGNOSIS: C01   ICD-10-CM   1. Metastatic cancer to cervical lymph nodes (HCC)  C77.0     2. Malignant neoplasm of base of tongue ([place])  C01      Cancer Staging Metastatic squamous cell carcinoma to lymph node ([place]) Staging form: Pharynx - HPV-Mediated Oropharynx, AJCC 8th Edition - Clinical stage from 11/01/2020: Stage I (cT1, cN1, cM0, p16+) - Signed by Eppie Gibson, MD on 11/01/2020 Stage prefix: Initial diagnosis    CHIEF COMPLAINT: Here to discuss management of cancer  HISTORY OF PRESENT ILLNESS::[name] is a 60 y.o. male who presented to Thomasene Mohair PA-C at [place] on 07/16/20 with a lump to the left side of his neck present for several months, which the patient described as a knot in the left side of his throat. Physical exam performed revealed palpable left cervical lymphadenopathy. Accordingly, the patient was given Augmentin which helped slightly reduce the area.  Soft tissue head and neck US performed on 08/06/20 demonstrated the pathologically enlarged/configured lymph node on the left neck to measure 3.3 cm x 2.7 cm x 2.0 cm.  Subsequently, the patient saw his PCP Glory Buff FNP who referred the patient to Dr. Chryl Heck at [facility] for further evaluation on 08/23/20. Dr. Chryl Heck recommended further imaging a biopsy.  Biopsy of left submandibular lymph node on 08/30/20 revealed: poorly differentiated carcinoma.  Patient was then referred to Dr. Constance Holster on 09/02/20 for further evaluation by fiberoptic laryngoscopy. Laryngoscopy revealed no acute findings or obvious mass.   Pertinent imaging thus far includes: --PET scan performed on 09/09/20 revealing  hypermetabolic left-sided level II cervical lymphadenopathy. PET also demonstrated relatively symmetric hypermetabolic FDG uptake identified in the anterior hypopharynx, and no discrete oro pharyngeal hypermetabolic lesion evident. Otherwise, no evidenc eof hypermetabolic metastatic disease was visualized in the chest, abdomen, or pelvis.  --Neck CT performed on 09/11/20 revealing two pathologic left level 2 lymph nodes as seen on prior PET-CT. No primary lesion or contralateral metastatic node was visualized.  Accordingly, the patient underwent direct laryngoscopy with biopsies on 10/23/20 under the care of Dr. Constance Holster. Random biopsies taken from the left tongue base and left tonsil. Biopsy of left tongue base revealed invasive moderate to poorly differentiated squamous cell carcinoma; biopsy of left tonsil revealed benign squamous mucosa and was negative for carcinoma. Left neck level 1-5 lymph nodes biopsies revealed metastatic squamous cell carcinoma to 3 of 19 lymph nodes (all positive lymph nodes belong to level III), with no evidence of extranodal extension. Salivary glandular tissue benign.   Patient most recently followed-up with Dr. Constance Holster on 10/28/20, during which time the patient was noted to be recovering well from his procedure. Referral to medical and radiation oncology was placed at this time by Dr. Constance Holster, and well as referral to [facility] for consideration of robotic surgery of the primary. (Dr Nicolette Bang)  Swallowing issues, if any: none  Weight Changes: none  Pain status: normal amounts of pain post laryngoscopy  Other symptoms: none  Tobacco history, if any: none  ETOH abuse, if any: none   He works at [employer] [place] and [place] the [place] border.  It takes him about 45 minutes to get here  He is here with his wife today who appears very supportive  PREVIOUS RADIATION THERAPY: No  PAST MEDICAL HISTORY:  has a past medical history of Cancer ([place]), Complication of  anesthesia, Diabetes mellitus without complication ([place]), Difficult intubation, Hyperlipidemia, Hypertension, Localized osteoarthritis of left shoulder, Osteoarthritis of left shoulder (09/29/2018), and Sleep apnea.    PAST SURGICAL HISTORY: Past Surgical History:  Procedure Laterality Date   BIOPSY  04/23/2020   Procedure: BIOPSY;  Surgeon: Eloise Harman, DO;  [facility];  Service: Endoscopy;;   COLONOSCOPY WITH PROPOFOL N/A 04/23/2020   Procedure: COLONOSCOPY WITH PROPOFOL;  Surgeon: Eloise Harman, DO;  [facility];  Service: Endoscopy;  Laterality: N/A;  am appt   DIRECT LARYNGOSCOPY N/A 10/23/2020   Procedure: DIRECT LARYNGOSCOPY WITH BIOPSIES;  Surgeon: Izora Gala, MD;  Location: [place];  Service: ENT;  Laterality: N/A;   ESOPHAGOSCOPY N/A 10/23/2020   Procedure: ESOPHAGOSCOPY;  Surgeon: Izora Gala, MD;  Location: [place];  Service: ENT;  Laterality: N/A;   KNEE ARTHROSCOPY Right 02/16/2006   POLYPECTOMY  04/23/2020   Procedure: POLYPECTOMY;  Surgeon: Eloise Harman, DO;  [facility];  Service: Endoscopy;;   RADICAL NECK DISSECTION Left 10/23/2020   Procedure: LEFT NECK DISSECTION;  Surgeon: Izora Gala, MD;  Location: [place];  Service: ENT;  Laterality: Left;   SHOULDER ARTHROSCOPY Right 02/17/2000   SKIN CANCER EXCISION     Multiple basal cell and squmous cell leisons removed   TOTAL SHOULDER ARTHROPLASTY Left 09/29/2018   Procedure: TOTAL SHOULDER ARTHROPLASTY;  Surgeon: Nicholes Stairs, MD;  Location: [place];  Service: Orthopedics;  Laterality: Left;  2.5 hrs    FAMILY HISTORY: family history includes CAD in his father; Diabetes in his father and paternal grandmother.  SOCIAL HISTORY:  reports that he has never smoked. He has never used smokeless tobacco. He reports that he does not drink alcohol and does not use drugs.  ALLERGIES: Patient has no known allergies.  MEDICATIONS:  Current Outpatient Medications  Medication Sig  Dispense Refill   amLODipine (NORVASC) 5 MG tablet Take 1 tablet (5 mg total) by mouth daily. 90 tablet 3   aspirin EC 81 MG tablet Take 1 tablet (81 mg total) by mouth daily. (Patient not taking: No sig reported) 90 tablet 1   blood glucose meter kit and supplies Dispense based on patient and insurance preference. Use up to four times daily as directed. (FOR ICD-9 250.00, 250.01). 1 each 0   busPIRone (BUSPAR) 5 MG tablet Take 1 tablet (5 mg total) by mouth 3 (three) times daily as needed. 90 tablet 1   CONTOUR NEXT TEST test strip USE AS DIRECTED TWICE DAILY 100 each 0   HYDROcodone-acetaminophen (NORCO) 7.5-325 MG tablet Take 1 tablet by mouth every 6 (six) hours as needed for moderate pain. 20 tablet 0   lisinopril-hydrochlorothiazide (ZESTORETIC) 20-12.5 MG tablet Take 2 tablets by mouth daily. 180 tablet 2   metFORMIN (GLUCOPHAGE-XR) 750 MG 24 hr tablet Take 1 tablet (750 mg total) by mouth daily with breakfast. 90 tablet 0   ondansetron (ZOFRAN ODT) 8 MG disintegrating tablet Take 1 tablet (8 mg total) by mouth every 8 (eight) hours as needed for nausea or vomiting. 20 tablet 0   rosuvastatin (CRESTOR) 20 MG tablet Take 1 tablet (20 mg total) by mouth daily. 90 tablet 3   TRULICITY 1.61 WR/6.0AV SOPN INJECT 1 DOSE SUBCUTANEOUSLY ONCE A WEEK 4 mL 0   No current  facility-administered medications for this encounter.    REVIEW OF SYSTEMS:  Notable for that above.   PHYSICAL EXAM:  height is 5' 10"  (1.778 m) and weight is 266 lb 9.6 oz (120.9 kg). His temperature is 97.8 F (36.6 C). His blood pressure is 129/77 and his pulse is 58 (abnormal). His respiration is 20 and oxygen saturation is 97%.   General: Alert and oriented, in no acute distress HEENT: Head is normocephalic. Extraocular movements are intact. Oropharynx is notable for no lesions in the upper throat.  Tongue is midline.  No thrush.. Neck: Neck is notable for anterior lymphedema.  Postoperative erythema over the left neck.   Staples are still in place along the left neck dissection scar.  No adenopathy appreciated to palpation Heart: Regular in rate and rhythm with no murmurs, rubs, or gallops. Chest: Clear to auscultation bilaterally, with no rhonchi, wheezes, or rales. Abdomen: Soft, nontender, nondistended, with no rigidity or guarding. Extremities: Lower extremity edema bilaterally Lymphatics: see Neck Exam Skin: See neck exam Musculoskeletal: Ambulatory. Neurologic:  No obvious focalities. Speech is fluent. Coordination is intact. Psychiatric: Judgment and insight are intact. Affect is appropriate.   ECOG = 1  0 - Asymptomatic (Fully active, able to carry on all predisease activities without restriction)  1 - Symptomatic but completely ambulatory (Restricted in physically strenuous activity but ambulatory and able to carry out work of a light or sedentary nature. For example, light housework, office work)  2 - Symptomatic, <50% in bed during the day (Ambulatory and capable of all self care but unable to carry out any work activities. Up and about more than 50% of waking hours)  3 - Symptomatic, >50% in bed, but not bedbound (Capable of only limited self-care, confined to bed or chair 50% or more of waking hours)  4 - Bedbound (Completely disabled. Cannot carry on any self-care. Totally confined to bed or chair)  5 - Death   Eustace Pen MM, Creech RH, Tormey DC, et al. [phone]). "Toxicity and response criteria of the [facility]". [place] Oncol. 5 (6): 649-55   LABORATORY DATA:  Lab Results  Component Value Date   WBC 7.2 10/22/2020   HGB 15.9 10/22/2020   HCT 46.5 10/22/2020   MCV 86.4 10/22/2020   PLT 263 10/22/2020   CMP     Component Value Date/Time   NA 139 10/22/2020 0900   NA 143 07/30/2020 1536   K 3.5 10/22/2020 0900   CL 103 10/22/2020 0900   CO2 28 10/22/2020 0900   GLUCOSE 161 (H) 10/22/2020 0900   BUN 14 10/22/2020 0900   BUN 13 07/30/2020 1536    CREATININE 1.08 10/22/2020 0900   CREATININE 0.85 08/23/2020 1033   CALCIUM 9.6 10/22/2020 0900   PROT 6.8 08/23/2020 1033   PROT 7.0 07/30/2020 1536   ALBUMIN 4.2 08/23/2020 1033   ALBUMIN 4.8 07/30/2020 1536   AST 33 08/23/2020 1033   ALT 50 (H) 08/23/2020 1033   ALKPHOS 50 08/23/2020 1033   BILITOT 1.0 08/23/2020 1033   GFRNONAA >60 10/22/2020 0900   GFRNONAA >60 08/23/2020 1033   GFRAA 98 01/30/2020 1621      Lab Results  Component Value Date   TSH 0.663 01/30/2020     RADIOGRAPHY: As above.  I personally reviewed his imaging.  IMPRESSION/PLAN:  This is a delightful patient with head and neck cancer.  He will be reviewed at our tumor board next week.  The gross adenopathy from his neck  has been resected.  A subclinical primary in the left base of tongue was randomly biopsied and came out positive.    He would like to avoid chemotherapy if possible.  Given the low burden of remaining gross disease and p16 positivity I think that 7 weeks of definitive radiation therapy to the throat and bilateral neck would have an excellent chance of curing his cancer without chemotherapy concurrently.  Alternatively, he may be a candidate for curative surgery, but his risk of regional nodal recurrence is would be higher with surgery alone then radiation alone.  I explained to him that radiation would allow [name] to comprehensively treat the involved tissues in the throat as well as the bilateral neck and retropharyngeal nodes.  Of course, there is the option of surgery with adjuvant radiation therapy, but this would have a much higher risk of morbidity than radiation alone or surgery alone.  We will tentatively schedule him for multidisciplinary clinic and radiation planning after he is cleared by dentistry.  I will also discuss his case with Dr Nicolette Bang.  We discussed the potential risks, benefits, and side effects of radiotherapy. We talked in detail about acute and late effects. We discussed that  some of the most bothersome acute effects may be mucositis, dysgeusia, salivary changes, skin irritation, hair loss, dehydration, weight loss and fatigue. We talked about late effects which include but are not necessarily limited to worsening lymphedema, dysphagia, hypothyroidism, nerve injury, vascular injury, spinal cord injury, xerostomia, trismus, neck edema, and potential injury to any of the tissues in the head and neck region. No guarantees of treatment were given. A consent form was signed and placed in the patient's medical record. The patient is enthusiastic about proceeding with treatment. I look forward to participating in the patient's care.    Simulation (treatment planning) will take place in early October, tentatively  We also discussed that the treatment of head and neck cancer is a multidisciplinary process to maximize treatment outcomes and quality of life. For this reason the following referrals have been or will be made:   Medical oncology to discuss chemotherapy (patient would like to avoid this if possible)   Dentistry for dental evaluation, possible extractions in the radiation fields, and /or advice on reducing risk of cavities, osteoradionecrosis, or other oral issues.   Nutritionist for nutrition support during and after treatment.   Speech language pathology for swallowing and/or speech therapy.   Social work for social support.    Physical therapy due to significant lymphedema in neck and risk of future deconditioning.     On date of service, in total, I spent 65 minutes on this encounter. Patient was seen in person.  __________________________________________   Eppie Gibson, MD  This document serves as a record of services personally performed by Eppie Gibson, MD. It was created on her behalf by Roney Mans, a trained medical scribe. The creation of this record is based on the scribe's personal observations and the provider's statements to them. This  document has been checked and approved by the attending provider.

## 2020-10-31 NOTE — Progress Notes (Signed)
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 Had a FNA done in IR on 08/30/2020.  Biopsies revealed:  10/23/2020 FINAL MICROSCOPIC DIAGNOSIS:  A. TONGUE, LEFT BASE, EXCISION:  - Invasive moderate to poorly differentiated squamous cell carcinoma  B. TONSIL, LEFT, BIOPSY:  - Benign squamous mucosa with associated lymphoid tissue  - Negative for carcinoma  C. LYMPH NODE, LEFT NECK ORIENTED LEVEL 1-5, DISSECTION:  - Metastatic squamous cell carcinoma to 3 of 19 lymph nodes, see comment  - No evidence of extranodal extension  - Benign salivary glandular tissue  COMMENT:  C.  All positive lymph nodes belong to level III  08/30/2020 FINAL MICROSCOPIC DIAGNOSIS:  A. LYMPH NODE, LEFT SUBMANDIBULAR, BIOPSY:  -  Poorly differentiated carcinoma  -  See comment  COMMENT:  Most of the tumor has features which favor a poorly differentiated squamous cell carcinoma; however, there is focal cytoplasmic vacuolization in the differential diagnosis would also include mucoid epidermoid carcinoma.  Immunohistochemistry (p40, p63, cytokeratin 5/6, p16 and EBV by in situ hybridization) are pending and will be reported in an addendum. ADDENDUM:  By immunohistochemistry, the neoplastic cells are positive for cytokeratin 5/6, p63, p40 and p16 but negative for EBV by in situ hybridization.  Mucicarmine also appears negative.  Overall, the immunophenotype favors a poorly differentiated squamous cell carcinoma.  Nutrition Status Yes No Comments  Weight changes? '[]'$  '[x]'$    Swallowing concerns? '[x]'$  '[]'$  Had difficulty with solid foods after neck dissection, but reports issues are improving  PEG? '[]'$  '[x]'$     Referrals Yes No  Comments  Social Work? '[x]'$  '[]'$    Dentistry? '[x]'$  '[]'$    Swallowing therapy? '[x]'$  '[]'$    Nutrition? '[x]'$  '[]'$    Med/Onc? '[x]'$  '[]'$  Dr. Arletha Pili Iruku   Safety Issues Yes No Comments  Prior radiation? '[]'$  '[x]'$    Pacemaker/ICD? '[]'$  '[x]'$    Possible current pregnancy? '[]'$  '[x]'$  N/A  Is the patient on methotrexate? '[]'$  '[x]'$     Tobacco/Marijuana/Snuff/ETOH use: Patient has never smoked or used smokeless tobacco. Denies any alcohol consumption or recreational drug use  Past/Anticipated interventions by otolaryngology, if any:  10/28/2020 (office visit) Dr. Izora Gala  --We discussed that additional therapy will be necessary. I am going to refer him to the medical and radiation oncology department here at [facility]. I am going to refer him to [facility] for consideration of robotic surgery of the primary. He will follow-up here Friday for staple removal  10/23/2020 Dr. Izora Gala DIRECT LARYNGOSCOPY WITH BIOPSIES ESOPHAGOSCOPY LEFT NECK DISSECTION  Past/Anticipated interventions by medical oncology, if any:  Under care of Dr. Arletha Pili Iruku 09/13/2020 --Given unknown primary and p16 positive squamous cell carcinoma, I do believe it is most likely a head and neck primary.   --He will benefit from surgery, neck dissection as well as endoscopic evaluation during surgery for primary.  He is trying to avoid chemotherapy if possible.   --He is open to surgical discussion.  Have called Dr. Constance Holster and discussed the case with him, he will follow-up with Dr. Constance Holster for surgical discussion.   --We will also present him in the head and neck tumor board coming Wednesday for further recommendations.  If he undergoes surgery, he will may still need adjuvant  radiation plus or minus chemotherapy. --He was recommended to follow-up with his PCP for thoracic aortic enlargement and he expressed understanding.  He will need a CT angiogram once a year.  Current Complaints / other details:  Has follow-up with Dr. Constance Holster later today  and surgical consult with Dr. Nicolette Bang on 11/13/2020

## 2020-11-01 ENCOUNTER — Ambulatory Visit
Admission: RE | Admit: 2020-11-01 | Discharge: 2020-11-01 | Disposition: A | Discharge status: Home / Self Care | Payer: BC Managed Care – PPO | Source: Ambulatory Visit | Attending: Radiation Oncology | Admitting: Radiation Oncology

## 2020-11-01 ENCOUNTER — Other Ambulatory Visit: Payer: Self-pay

## 2020-11-01 ENCOUNTER — Encounter: Payer: Self-pay | Admitting: Radiation Oncology

## 2020-11-01 VITALS — BP 129/77 | HR 58 | Temp 97.8°F | Resp 20 | Ht 70.0 in | Wt 266.6 lb

## 2020-11-01 DIAGNOSIS — G4730 Sleep apnea, unspecified: Secondary | ICD-10-CM | POA: Diagnosis not present

## 2020-11-01 DIAGNOSIS — I10 Essential (primary) hypertension: Secondary | ICD-10-CM | POA: Diagnosis not present

## 2020-11-01 DIAGNOSIS — E119 Type 2 diabetes mellitus without complications: Secondary | ICD-10-CM | POA: Diagnosis not present

## 2020-11-01 DIAGNOSIS — Z7984 Long term (current) use of oral hypoglycemic drugs: Secondary | ICD-10-CM | POA: Diagnosis not present

## 2020-11-01 DIAGNOSIS — E785 Hyperlipidemia, unspecified: Secondary | ICD-10-CM | POA: Insufficient documentation

## 2020-11-01 DIAGNOSIS — C01 Malignant neoplasm of base of tongue: Secondary | ICD-10-CM | POA: Insufficient documentation

## 2020-11-01 DIAGNOSIS — M1990 Unspecified osteoarthritis, unspecified site: Secondary | ICD-10-CM | POA: Diagnosis not present

## 2020-11-01 DIAGNOSIS — Z7982 Long term (current) use of aspirin: Secondary | ICD-10-CM | POA: Insufficient documentation

## 2020-11-01 DIAGNOSIS — Z79899 Other long term (current) drug therapy: Secondary | ICD-10-CM | POA: Insufficient documentation

## 2020-11-01 DIAGNOSIS — C770 Secondary and unspecified malignant neoplasm of lymph nodes of head, face and neck: Secondary | ICD-10-CM

## 2020-11-01 NOTE — Progress Notes (Signed)
Oncology Nurse Navigator Documentation   Met with patient during initial consult with Dr. Isidore Moos. He was accompanied by is wife, Tommie Sams.  Further introduced myself as his/their Navigator, explained my role as a member of the Care Team. Provided New Patient Information packet: Contact information for physician, this navigator, other members of the Care Team Advance Directive information ([place] blue pamphlet with LCSW insert); provided [facility] AD booklet at his request,  Fall Prevention Patient [place] Information sheet Symptom Management Clinic information [facility] campus map with highlight of [place] SLP Information sheet Assisted with post-consult appt scheduling. Discussed the location of Dr. Raynelle Dick office and [facility] as reference for his 10/3 appointment with Dr. Benson Norway, including arrival procedure for these appts.   They verbalized understanding of information provided. I encouraged them to call with questions/concerns moving forward.  Harlow Asa, RN, BSN, OCN Head & Neck Oncology Nurse [place] at [place] [phone]

## 2020-11-04 ENCOUNTER — Other Ambulatory Visit: Payer: Self-pay

## 2020-11-04 ENCOUNTER — Encounter: Payer: Self-pay | Admitting: Hematology and Oncology

## 2020-11-04 ENCOUNTER — Inpatient Hospital Stay: Payer: BC Managed Care – PPO | Attending: Hematology and Oncology | Admitting: Hematology and Oncology

## 2020-11-04 VITALS — BP 130/85 | HR 69 | Temp 98.3°F | Resp 18 | Wt 266.5 lb

## 2020-11-04 DIAGNOSIS — R590 Localized enlarged lymph nodes: Secondary | ICD-10-CM | POA: Diagnosis not present

## 2020-11-04 DIAGNOSIS — C01 Malignant neoplasm of base of tongue: Secondary | ICD-10-CM | POA: Diagnosis not present

## 2020-11-04 DIAGNOSIS — C779 Secondary and unspecified malignant neoplasm of lymph node, unspecified: Secondary | ICD-10-CM

## 2020-11-04 DIAGNOSIS — C770 Secondary and unspecified malignant neoplasm of lymph nodes of head, face and neck: Secondary | ICD-10-CM | POA: Insufficient documentation

## 2020-11-04 DIAGNOSIS — A630 Anogenital (venereal) warts: Secondary | ICD-10-CM | POA: Insufficient documentation

## 2020-11-04 NOTE — Progress Notes (Signed)
[facility] NOTE  Patient Care Team: Sharion Balloon, FNP as PCP - General (Nurse Practitioner) Eloise Harman, DO as Consulting Physician (Internal Medicine) Harlen Labs, MD as Referring Physician (Optometry)  CHIEF COMPLAINTS/PURPOSE OF CONSULTATION:  Lymph node neck   ASSESSMENT & PLAN:  P16 positive poorly differentiated squamous cell carcinoma noted in left cervical lymphadenopathy without definitive primary This is a very pleasant 61 year old male with palpable left cervical lymphadenopathy for the past 2 months, no other acute complaints referred to hematology and oncology for further evaluation.  He had ultrasound imaging which showed a pathologic lymph node in the left neck measuring over 3.3 x 2.7 x 2 cm.  There was no definitive primary identified on systemic imaging however when he underwent lymph node dissection, base of the tongue biopsies were positive for poorly differentiated squamous cell carcinoma consistent with primary from the BOT.  He is a stage I HPV positive squamous cell carcinoma of the base of the tongue status post lymph node dissection.  He has seen Dr. Isidore Moos and also has a follow-up with ENT team at [facility] for consideration of TORS Surgery I agree that there is no clear role for chemotherapy at this time.  However if he undergoes surgery and if there is any evidence of positive margins or extranodal extension, we will be happy to deliver adjuvant chemotherapy along with radiation. He will continue follow-up and surveillance with ENT team as well as radiation oncology and can return to medical oncology in case there is a recurrence or metastatic disease in the future.  He had some questions about long-term adverse effects which we have discussed today.  He also had some questions about supplements during treatment and immediate toxicity that he should anticipate.  All his questions were answered to the best of my knowledge.  He will  return to clinic with medical oncology as needed.  He was recommended to follow-up with his PCP for thoracic aortic enlargement and he expressed understanding.  He will need a CT angiogram once a year.  Thank you for consulting [name] in the care of this patient.  Please not hesitate to contact us with any additional questions or concerns.   HISTORY OF PRESENTING ILLNESS:   Gregory Carey 60 y.o. male is here because of palpable lymph node left neck.  This is a very pleasant 60 year old male patient with past medical history significant for diabetes, hypertension and dyslipidemia referred to hematology for evaluation of's lymphadenopathy.  Patient first noticed palpable painless left neck lymph node a couple months ago, had a trial of antibiotics for about 2 weeks.    He had ultrasound imaging which showed ultrasound of head and neck showed pathologic lymph node in the left neck measuring 3.3 x 2.7 x 2 cm.  No additional nodes were imaged.  He had a PET/CT imaging and CT imaging of his neck. The neck showed 2 pathologic left level 2 lymph nodes, no primary lesion or contralateral metastatic node by CT. PET scan showed relatively symmetric hypermetabolic FDG uptake in the anterior hypopharynx, no discrete oropharyngeal hypermetabolic lesion, hypermetabolic left-sided level 2 cervical lymphadenopathy no evidence of hypermetabolic metastatic disease in chest abdomen pelvis, hepatic steatosis, aortic atherosclerosis and thoracic aortic aneurysm which needs to be monitored. Left submandibular lymph node biopsy showed poorly differentiated carcinoma, most of the tumor has features which favor poorly differentiated squamous cell carcinoma however there is focal cytoplasmic vacuolization in the differential diagnosis which could also  include mucoid epidermoid carcinoma.  Addendum issued showed that by immunohistochemistry, neoplastic cells are positive for cytokeratin 5/6, p63, p40 0 and p16 but negative for  EBV by in situ hybridization.  Mucicarmine also appears negative.  Overall immunophenotype favors a poorly differentiated squamous cell carcinoma. Given unknown primary and p16 positive squamous cell carcinoma, he had left BOT biopsy, left tonsil biopsy, left modified neck dissection including levels 1 through 5. Pathology from left tongue base showed invasive moderate to poorly differentiated squamous cell carcinoma, left tonsil biopsy showed benign squamous mucosa with associated lymphoid tissue, negative for carcinoma.   Lymph node dissection on the left neck showed metastatic squamous cell carcinoma to 3 out of 19 lymph nodes, no evidence of extranodal extension, benign salivary glandular tissue. It appears that he had base of the tongue primary, HPV positive poorly differentiated squamous cell carcinoma.  He has seen Dr. Isidore Moos for consideration of definitive radiation and he is also has a follow-up with ENT team for consideration of TORS surgery. He feels well, has a neck hematoma which is slowly healing.  He otherwise denies any new health complaints. Rest of the pertinent 10 point ROS reviewed and negative.  MEDICAL HISTORY:  Past Medical History:  Diagnosis Date   Cancer [facility])    neck   Complication of anesthesia    Diabetes mellitus without complication ([place])    Difficult intubation    Was infomed by MD Anesth his airway was small   Hyperlipidemia    Hypertension    Localized osteoarthritis of left shoulder    Osteoarthritis of left shoulder 09/29/2018   Sleep apnea    uses CPAP    SURGICAL HISTORY: Past Surgical History:  Procedure Laterality Date   BIOPSY  04/23/2020   Procedure: BIOPSY;  Surgeon: Eloise Harman, DO;  [facility];  Service: Endoscopy;;   COLONOSCOPY WITH PROPOFOL N/A 04/23/2020   Procedure: COLONOSCOPY WITH PROPOFOL;  Surgeon: Eloise Harman, DO;  [facility];  Service: Endoscopy;  Laterality: N/A;  am appt   DIRECT  LARYNGOSCOPY N/A 10/23/2020   Procedure: DIRECT LARYNGOSCOPY WITH BIOPSIES;  Surgeon: Izora Gala, MD;  Location: [place];  Service: ENT;  Laterality: N/A;   ESOPHAGOSCOPY N/A 10/23/2020   Procedure: ESOPHAGOSCOPY;  Surgeon: Izora Gala, MD;  Location: [place];  Service: ENT;  Laterality: N/A;   KNEE ARTHROSCOPY Right 02/16/2006   POLYPECTOMY  04/23/2020   Procedure: POLYPECTOMY;  Surgeon: Eloise Harman, DO;  [facility];  Service: Endoscopy;;   RADICAL NECK DISSECTION Left 10/23/2020   Procedure: LEFT NECK DISSECTION;  Surgeon: Izora Gala, MD;  Location: [place];  Service: ENT;  Laterality: Left;   SHOULDER ARTHROSCOPY Right 02/17/2000   SKIN CANCER EXCISION     Multiple basal cell and squmous cell leisons removed   TOTAL SHOULDER ARTHROPLASTY Left 09/29/2018   Procedure: TOTAL SHOULDER ARTHROPLASTY;  Surgeon: Nicholes Stairs, MD;  Location: [place];  Service: Orthopedics;  Laterality: Left;  2.5 hrs    SOCIAL HISTORY: Social History   Socioeconomic History   Marital status: Married    Spouse name: Not on file   Number of children: Not on file   Years of education: Not on file   Highest education level: Not on file  Occupational History   Not on file  Tobacco Use   Smoking status: Never   Smokeless tobacco: Never  Vaping Use   Vaping Use: Never used  Substance and Sexual Activity  Alcohol use: No   Drug use: No   Sexual activity: Yes  Other Topics Concern   Not on file  Social History Narrative   Not on file   Social Determinants of Health   Financial Resource Strain: Not on file  Food Insecurity: No Food Insecurity   Worried About Running Out of Food in the Last Year: Never true   Ran Out of Food in the Last Year: Never true  Transportation Needs: No Transportation Needs   Lack of Transportation (Medical): No   Lack of Transportation (Non-Medical): No  Physical Activity: Not on file  Stress: No Stress Concern Present   Feeling of Stress : Only a  little  Social Connections: Not on file  Intimate Partner Violence: Not on file    FAMILY HISTORY: Family History  Problem Relation Age of Onset   CAD Father    Diabetes Father    Diabetes Paternal Grandmother    Colon cancer Neg Hx    Colon polyps Neg Hx     ALLERGIES:  has No Known Allergies.  MEDICATIONS:  Current Outpatient Medications  Medication Sig Dispense Refill   amLODipine (NORVASC) 5 MG tablet Take 1 tablet (5 mg total) by mouth daily. 90 tablet 3   aspirin EC 81 MG tablet Take 1 tablet (81 mg total) by mouth daily. (Patient not taking: No sig reported) 90 tablet 1   blood glucose meter kit and supplies Dispense based on patient and insurance preference. Use up to four times daily as directed. (FOR ICD-9 250.00, 250.01). 1 each 0   busPIRone (BUSPAR) 5 MG tablet Take 1 tablet (5 mg total) by mouth 3 (three) times daily as needed. 90 tablet 1   CONTOUR NEXT TEST test strip USE AS DIRECTED TWICE DAILY 100 each 0   HYDROcodone-acetaminophen (NORCO) 7.5-325 MG tablet Take 1 tablet by mouth every 6 (six) hours as needed for moderate pain. 20 tablet 0   lisinopril-hydrochlorothiazide (ZESTORETIC) 20-12.5 MG tablet Take 2 tablets by mouth daily. 180 tablet 2   metFORMIN (GLUCOPHAGE-XR) 750 MG 24 hr tablet Take 1 tablet (750 mg total) by mouth daily with breakfast. 90 tablet 0   ondansetron (ZOFRAN ODT) 8 MG disintegrating tablet Take 1 tablet (8 mg total) by mouth every 8 (eight) hours as needed for nausea or vomiting. 20 tablet 0   rosuvastatin (CRESTOR) 20 MG tablet Take 1 tablet (20 mg total) by mouth daily. 90 tablet 3   TRULICITY 8.88 LN/7.9JK SOPN INJECT 1 DOSE SUBCUTANEOUSLY ONCE A WEEK 4 mL 0   No current facility-administered medications for this visit.     PHYSICAL EXAMINATION:  ECOG PERFORMANCE STATUS: 0 - Asymptomatic  Vitals:   11/04/20 0848  BP: 130/85  Pulse: 69  Resp: 18  Temp: 98.3 F (36.8 C)  SpO2: 98%   Filed Weights   11/04/20 0848  Weight:  266 lb 8 oz (120.9 kg)   Focused exam, neck swollen and given recent surgery with some postop hematoma, otherwise no distress.  LABORATORY DATA:  I have reviewed the data as listed Lab Results  Component Value Date   WBC 7.2 10/22/2020   HGB 15.9 10/22/2020   HCT 46.5 10/22/2020   MCV 86.4 10/22/2020   PLT 263 10/22/2020     Chemistry      Component Value Date/Time   NA 139 10/22/2020 0900   NA 143 07/30/2020 1536   K 3.5 10/22/2020 0900   CL 103 10/22/2020 0900   CO2 28 10/22/2020  0900   BUN 14 10/22/2020 0900   BUN 13 07/30/2020 1536   CREATININE 1.08 10/22/2020 0900   CREATININE 0.85 08/23/2020 1033      Component Value Date/Time   CALCIUM 9.6 10/22/2020 0900   ALKPHOS 50 08/23/2020 1033   AST 33 08/23/2020 1033   ALT 50 (H) 08/23/2020 1033   BILITOT 1.0 08/23/2020 1033     I reviewed pertinent pathology results.  RADIOGRAPHIC STUDIES: I have personally reviewed the radiological images as listed and agreed with the findings in the report. No results found.   All questions were answered. The patient knows to call the clinic with any problems, questions or concerns. I spent 30 minutes in the care of this patient including History, review of records, counseling and coordination of care. I have discussed pertinent pathology results, discussed with radiation oncology Dr.Squire and agree with the plan of definitive radiation, there is no clear reason for chemotherapy at this time.  We have discussed briefly about the course of radiation, he had some questions about the long-term adverse effects which we have discussed and surveillance recommendations.   Benay Pike, MD 11/04/2020 9:06 AM

## 2020-11-08 ENCOUNTER — Other Ambulatory Visit: Payer: Self-pay

## 2020-11-08 DIAGNOSIS — R5383 Other fatigue: Secondary | ICD-10-CM

## 2020-11-08 DIAGNOSIS — C779 Secondary and unspecified malignant neoplasm of lymph node, unspecified: Secondary | ICD-10-CM

## 2020-11-08 DIAGNOSIS — C01 Malignant neoplasm of base of tongue: Secondary | ICD-10-CM

## 2020-11-08 DIAGNOSIS — R5381 Other malaise: Secondary | ICD-10-CM

## 2020-11-12 ENCOUNTER — Telehealth: Payer: Self-pay | Admitting: Radiation Oncology

## 2020-11-12 ENCOUNTER — Other Ambulatory Visit: Payer: Self-pay

## 2020-11-12 DIAGNOSIS — C01 Malignant neoplasm of base of tongue: Secondary | ICD-10-CM

## 2020-11-12 NOTE — Telephone Encounter (Signed)
Scheduled per sch msg. Called and was not able to leave msg. Will attempt to call again

## 2020-11-13 ENCOUNTER — Encounter: Payer: Self-pay | Admitting: General Practice

## 2020-11-13 DIAGNOSIS — C770 Secondary and unspecified malignant neoplasm of lymph nodes of head, face and neck: Secondary | ICD-10-CM | POA: Diagnosis not present

## 2020-11-13 DIAGNOSIS — C01 Malignant neoplasm of base of tongue: Secondary | ICD-10-CM | POA: Diagnosis not present

## 2020-11-13 NOTE — Progress Notes (Signed)
[place] Work  Initial Assessment   Gregory Carey is a 60 y.o. year old male contacted by phone. Clinical Social Work was referred by radiation oncologist for assessment of psychosocial needs. Patient unavailable, spoke w wife.   SDOH (Social Determinants of Health) assessments performed: Yes   Distress Screen completed: No No flowsheet data found.    Family/Social Information:  Housing Arrangement: patient lives with wife.   Lives in [place] in [place]. Family members/support persons in your life? Family, neighbors,  Transportation concerns: no, wife will help as needed  Employment: Out on work excuse. Income source: Short-Term Disability, long term disability and is also on FMLA Financial concerns:  "were going to try and see how it goes" Type of concern:  unsure Food access concerns: no Religious or spiritual practice: no Medication Concerns: no, "not yet", only taking "normal medications" Services Currently in place:  none  Coping/ Adjustment to diagnosis: Patient understands treatment plan and what happens next? yes, newly diagnosed w tongue cancer, has seen surgeon who advised that radiation treatment would most likely be adequate per wife.  They are scheduled for dental, nutrition evaluations.  Look forward especially to guidance about what to eat given his diabetes.  Wife is primary cook - she wants to know how best to help him.   Concerns about diagnosis and/or treatment: How I will care for other members of my family  wife is disabled w COPD, his concern is being well and continuing to support her as needed.  She is very active and capable at this point. Patient reported stressors:  worried about leaving wife who has COPD and is on oxygen therapy; she does as much as she can, she is able to drive Hopes and priorities: getting through treatment, adequate nutrition despite challenges posec by head and neck cancer treatment and diabetes Patient enjoys  being outside, time with family/ friends, and likes to ride side by side, "pretty much just hang out around the house Current coping skills/ strengths: Ability for insight , Average or above average intelligence , Capable of independent living , Communication skills , Financial means , and Supportive family/friends     SUMMARY: Current SDOH Barriers:  None, other than living a quite a distance from [facility] and needing to come for daily treatments.   Interventions: Discussed common feeling and emotions when being diagnosed with cancer, and the importance of support during treatment Informed patient of the support team roles and support services at [facility] Provided CSW contact information and encouraged patient to call with any questions or concerns Provided patient with information about [place] - will ask financial advocates to reach out to them re qualifications, also mentioned availability of financial help through [facility] in [place]: Patient will contact CSW with any support or resource needs Patient verbalizes understanding of plan: Yes    [name] , [place], [place] Worker Phone:  [phone]

## 2020-11-14 ENCOUNTER — Telehealth: Payer: Self-pay

## 2020-11-14 NOTE — Telephone Encounter (Signed)
Notified Patient of completion of Short Term Disability Forms. Request for medical records forwarded to [facility] Management Office with signed Release of Information Form.

## 2020-11-14 NOTE — Telephone Encounter (Signed)
Copy mailed to Patient as requested.

## 2020-11-15 ENCOUNTER — Inpatient Hospital Stay: Payer: BC Managed Care – PPO | Admitting: Dietician

## 2020-11-15 ENCOUNTER — Other Ambulatory Visit: Payer: Self-pay

## 2020-11-15 NOTE — Progress Notes (Signed)
Nutrition Assessment   Reason for Assessment: New Head and Neck   ASSESSMENT: 60 year old male with newly diagnosed base of tongue cancer metastatic to cervical lymph nodes. He is s/p endoscopy and biopsy with neck dissection on 9/7 by Dr. Constance Holster. At [facility]. Planning for 7 weeks of definitive radiation therapy. Patient is followed by Dr. Isidore Moos.   Past medical history includes HLD, morbid obesity, DM2, OSA, HTN  Met with patient and wife in clinic today. He reports having a good appetite and eating well. Patient is tolerating regular textures, denies swallowing difficulties. He had a tenderloin biscuit and gravy this morning for breakfast. Patient eats 3 meals daily, sometimes he will snack on popcorn and likes gummy bears. Yesterday he had a bowl of cereal with whole milk, chicken, broccoli and rice casserole, and a few slices of pizza at the football game. Patient wife reports he does not drink a lot of water, recalls 1 glass/day. He is working to increase this.   Nutrition Focused Physical Exam: deferred   Medications: Norco, Metformin, Zofran ODT, Trulicity   Labs: 9/6 - HgbA1c 6.8   Anthropometrics: Last weight 266.5 lb on 9/19 decreased ~8 lbs (3%) from 274 lb 12.8 oz on 9/6. This is significant for time frame.   Height: 5'10" Weight: 120.9 kg  UBW: 275 lb (04/19/20) BMI: 38.24   Estimated Energy Needs  Kcals: 8242-3536 Protein: 133-145 Fluid: 2.5 L   NUTRITION DIAGNOSIS: Predicted suboptimal intake related to newly diagnosed head and neck cancer as evidenced by side effects of pending definitive radiation therapy effecting ability to swallow.    INTERVENTION: Educated on importance of adequate calories and protein to maintain weights, strength and nutrition Encouraged snacks in between meals with focus on protein - handout with ideas provided Discussed importance of oral care - handout provided Patient will work to increase water intake, recommended 2.5 L  fluid/day Discussed soft, moist high protein foods and ways to alter texture for ease of intake - handout with recipes provided Recommended Ensure Complete/equivalent daily for added calories and protein - samples of Ensure Complete, Ensure Enlive, CIB given for pt to try as well as recipes for shakes Ensure coupons provided Contact information provided    MONITORING, EVALUATION, GOAL: Patient will tolerate adequate calories and protein to minimize weight loss   Next Visit: To be scheduled weekly with treatment plan

## 2020-11-18 ENCOUNTER — Other Ambulatory Visit: Payer: Self-pay

## 2020-11-18 ENCOUNTER — Other Ambulatory Visit: Payer: Self-pay | Admitting: Family

## 2020-11-18 ENCOUNTER — Ambulatory Visit (INDEPENDENT_AMBULATORY_CARE_PROVIDER_SITE_OTHER): Payer: BC Managed Care – PPO | Admitting: Dentistry

## 2020-11-18 DIAGNOSIS — K117 Disturbances of salivary secretion: Secondary | ICD-10-CM

## 2020-11-18 DIAGNOSIS — C01 Malignant neoplasm of base of tongue: Secondary | ICD-10-CM

## 2020-11-18 DIAGNOSIS — K0530 Chronic periodontitis, unspecified: Secondary | ICD-10-CM

## 2020-11-18 DIAGNOSIS — K029 Dental caries, unspecified: Secondary | ICD-10-CM

## 2020-11-18 DIAGNOSIS — R065 Mouth breathing: Secondary | ICD-10-CM

## 2020-11-18 DIAGNOSIS — K036 Deposits [accretions] on teeth: Secondary | ICD-10-CM

## 2020-11-18 DIAGNOSIS — Z794 Long term (current) use of insulin: Secondary | ICD-10-CM

## 2020-11-18 DIAGNOSIS — K030 Excessive attrition of teeth: Secondary | ICD-10-CM

## 2020-11-18 DIAGNOSIS — C779 Secondary and unspecified malignant neoplasm of lymph node, unspecified: Secondary | ICD-10-CM

## 2020-11-18 DIAGNOSIS — K06020 Generalized gingival recession, unspecified: Secondary | ICD-10-CM

## 2020-11-18 DIAGNOSIS — Z01818 Encounter for other preprocedural examination: Secondary | ICD-10-CM

## 2020-11-18 DIAGNOSIS — M270 Developmental disorders of jaws: Secondary | ICD-10-CM

## 2020-11-18 DIAGNOSIS — C770 Secondary and unspecified malignant neoplasm of lymph nodes of head, face and neck: Secondary | ICD-10-CM

## 2020-11-18 DIAGNOSIS — E1165 Type 2 diabetes mellitus with hyperglycemia: Secondary | ICD-10-CM

## 2020-11-18 DIAGNOSIS — K08109 Complete loss of teeth, unspecified cause, unspecified class: Secondary | ICD-10-CM

## 2020-11-18 NOTE — Progress Notes (Signed)
Department of Dental Medicine          OUTPATIENT CONSULT   Service Date:   11/18/2020  Patient Name:   Gregory Carey Date of Birth:   1961/01/08 Medical Record Number: [id number]  Referring Provider:                Eppie Carey, M.D.  >  Plan/Recommendations <   Assessment There are no current signs of acute odontogenic infection including abscess, edema or erythema, or suspicious lesion requiring biopsy.   There are a few teeth with cavities and some localized calculus build-up on posterior teeth.  There is also xerostomia at baseline due to CPAP use.  Recommendations No dental intervention indicated prior to radiation at this time.   Plan Discuss case with medical team and coordinate treatment as needed. Follow-up after completion of radiation therapy.  Fluoride tray delivery.  Discussed in detail all treatment options and recommendations with the patient and they are agreeable to the plan.    Thank you for consulting with [facility] and for the opportunity to participate in this patient's treatment.  Should you have any questions or concerns, please contact the [facility] at [phone].    11/18/2020 Consult Note:  COVID-19 SCREENING:  The patient denies symptoms concerning for COVID-19 infection including fever, chills, cough, or newly developed shortness of breath.   HISTORY OF PRESENT ILLNESS: Gregory Carey is a very pleasant 60 y.o. male with h/o hypertension, hyperlipidemia, OSA, type 2 diabetes mellitus, osteoarthritis and obesity who was recently diagnosed with metastatic cancer to cervical lymph nodes with malignant neoplasm of base of tongue (SCC) and is anticipating head and neck radiation.  The patient presents today for a medically necessary dental consultation as part of their pre-radiation therapy work-up.   DENTAL HISTORY: The patient reports that he does have a dentist he sees regularly.  He goes to [facility] every 6  months for a cleaning and periodic exam.  He currently denies any dental/orofacial pain or sensitivity. Patient is able to manage oral secretions.  Patient denies dysphagia, odynophagia, dysphonia, SOB and neck pain.  Patient denies fever, rigors and malaise.   CHIEF COMPLAINT:  Here for a pre-head and neck radiation dental exam.   Patient Active Problem List   Diagnosis Date Noted   Malignant neoplasm of base of tongue ([place]) 11/01/2020   Metastatic cancer to cervical lymph nodes ([place]) 10/23/2020   Metastatic squamous cell carcinoma to lymph node ([place]) 09/13/2020   Osteoarthritis, knee 08/29/2019   OSA (obstructive sleep apnea) 02/26/2019   Osteoarthritis of left shoulder 09/29/2018   History of left shoulder replacement 09/29/2018   Diabetes mellitus, type 2 ([place]) 07/29/2015   Essential hypertension 04/29/2015   Hyperlipidemia 04/29/2015   Morbid obesity ([place]) 04/29/2015   Past Medical History:  Diagnosis Date   Cancer ([place])    neck   Complication of anesthesia    Diabetes mellitus without complication ([place])    Difficult intubation    Was infomed by MD Anesth his airway was small   Hyperlipidemia    Hypertension    Localized osteoarthritis of left shoulder    Osteoarthritis of left shoulder 09/29/2018   Sleep apnea    uses CPAP   Past Surgical History:  Procedure Laterality Date   BIOPSY  04/23/2020   Procedure: BIOPSY;  Surgeon: Gregory Harman, DO;  [facility];  Service: Endoscopy;;   COLONOSCOPY WITH PROPOFOL N/A 04/23/2020  Procedure: COLONOSCOPY WITH PROPOFOL;  Surgeon: Gregory Harman, DO;  [facility];  Service: Endoscopy;  Laterality: N/A;  am appt   DIRECT LARYNGOSCOPY N/A 10/23/2020   Procedure: DIRECT LARYNGOSCOPY WITH BIOPSIES;  Surgeon: Gregory Gala, MD;  Location: [place];  Service: ENT;  Laterality: N/A;   ESOPHAGOSCOPY N/A 10/23/2020   Procedure: ESOPHAGOSCOPY;  Surgeon: Gregory Gala, MD;  Location: [place];  Service: ENT;   Laterality: N/A;   KNEE ARTHROSCOPY Right 02/16/2006   POLYPECTOMY  04/23/2020   Procedure: POLYPECTOMY;  Surgeon: Gregory Harman, DO;  [facility];  Service: Endoscopy;;   RADICAL NECK DISSECTION Left 10/23/2020   Procedure: LEFT NECK DISSECTION;  Surgeon: Gregory Gala, MD;  Location: [place];  Service: ENT;  Laterality: Left;   SHOULDER ARTHROSCOPY Right 02/17/2000   SKIN CANCER EXCISION     Multiple basal cell and squmous cell leisons removed   TOTAL SHOULDER ARTHROPLASTY Left 09/29/2018   Procedure: TOTAL SHOULDER ARTHROPLASTY;  Surgeon: Gregory Stairs, MD;  Location: [place];  Service: Orthopedics;  Laterality: Left;  2.5 hrs   No Known Allergies Current Outpatient Medications  Medication Sig Dispense Refill   amLODipine (NORVASC) 5 MG tablet Take 1 tablet (5 mg total) by mouth daily. 90 tablet 3   aspirin EC 81 MG tablet Take 1 tablet (81 mg total) by mouth daily. (Patient not taking: No sig reported) 90 tablet 1   blood glucose meter kit and supplies Dispense based on patient and insurance preference. Use up to four times daily as directed. (FOR ICD-9 250.00, 250.01). 1 each 0   busPIRone (BUSPAR) 5 MG tablet Take 1 tablet (5 mg total) by mouth 3 (three) times daily as needed. 90 tablet 1   CONTOUR NEXT TEST test strip USE AS DIRECTED TWICE DAILY 100 each 0   HYDROcodone-acetaminophen (NORCO) 7.5-325 MG tablet Take 1 tablet by mouth every 6 (six) hours as needed for moderate pain. 20 tablet 0   lisinopril-hydrochlorothiazide (ZESTORETIC) 20-12.5 MG tablet Take 2 tablets by mouth daily. 180 tablet 2   metFORMIN (GLUCOPHAGE-XR) 750 MG 24 hr tablet Take 1 tablet (750 mg total) by mouth daily with breakfast. 90 tablet 0   ondansetron (ZOFRAN ODT) 8 MG disintegrating tablet Take 1 tablet (8 mg total) by mouth every 8 (eight) hours as needed for nausea or vomiting. 20 tablet 0   rosuvastatin (CRESTOR) 20 MG tablet Take 1 tablet (20 mg total) by mouth daily. 90 tablet 3    TRULICITY 9.23 RA/0.7MA SOPN INJECT 1 DOSE SUBCUTANEOUSLY ONCE A WEEK 4 mL 0   No current facility-administered medications for this visit.    LABS: Lab Results  Component Value Date   WBC 7.2 10/22/2020   HGB 15.9 10/22/2020   HCT 46.5 10/22/2020   MCV 86.4 10/22/2020   PLT 263 10/22/2020      Component Value Date/Time   NA 139 10/22/2020 0900   NA 143 07/30/2020 1536   K 3.5 10/22/2020 0900   CL 103 10/22/2020 0900   CO2 28 10/22/2020 0900   GLUCOSE 161 (H) 10/22/2020 0900   BUN 14 10/22/2020 0900   BUN 13 07/30/2020 1536   CREATININE 1.08 10/22/2020 0900   CREATININE 0.85 08/23/2020 1033   CALCIUM 9.6 10/22/2020 0900   GFRNONAA >60 10/22/2020 0900   GFRNONAA >60 08/23/2020 1033   GFRAA 98 01/30/2020 1621   Lab Results  Component Value Date   INR 1.0 08/30/2020   No results found for:  PTT  Social History   Socioeconomic History   Marital status: Married    Spouse name: Not on file   Number of children: Not on file   Years of education: Not on file   Highest education level: Not on file  Occupational History   Not on file  Tobacco Use   Smoking status: Never   Smokeless tobacco: Never  Vaping Use   Vaping Use: Never used  Substance and Sexual Activity   Alcohol use: No   Drug use: No   Sexual activity: Yes  Other Topics Concern   Not on file  Social History Narrative   Not on file   Social Determinants of Health   Financial Resource Strain: Low Risk    Difficulty of Paying Living Expenses: Not very hard  Food Insecurity: No Food Insecurity   Worried About Running Out of Food in the Last Year: Never true   Ran Out of Food in the Last Year: Never true  Transportation Needs: No Transportation Needs   Lack of Transportation (Medical): No   Lack of Transportation (Non-Medical): No  Physical Activity: Not on file  Stress: No Stress Concern Present   Feeling of Stress : Only a little  Social Connections: Not on file  Intimate Partner Violence: Not  on file   Family History  Problem Relation Age of Onset   CAD Father    Diabetes Father    Diabetes Paternal Grandmother    Colon cancer Neg Hx    Colon polyps Neg Hx      REVIEW OF SYSTEMS:  Reviewed with the patient as per HPI. Psych: Patient denies having dental phobia.   VITAL SIGNS: BP (!) 154/104 (BP Location: Right Arm, Patient Position: Sitting, Cuff Size: Normal)   Pulse 64   Temp 98.6 F (37 C) (Oral)    PHYSICAL EXAM: General:  Well-developed, comfortable and in no apparent distress. Neurological:  Alert and oriented to person, place and  time. Extraoral:  Facial symmetry present without any edema or erythema.  No swelling or lymphadenopathy.  TMJ asymptomatic without clicks or crepitations.  Intraoral:  Soft tissues appear well-perfused and mucous membranes moist.  FOM and vestibules soft and not raised. Oral cavity without mass or lesion. No signs of infection, parulis, sinus tract, edema or erythema evident upon exam.   (+) Bilateral mandibular tori (+) Thick and viscous saliva; foamy appearance intraorally    DENTAL EXAM: Hard tissue exam completed and charted. Overall impression:  Good remaining dentition. Oral hygiene:  Fair   Periodontal:  Pink, healthy gingival tissue with blunted papilla.  Localized calculus accumulation.  Gingival recession, generalized. Caries:  #17O, #18O Occlusion:  Class I molar occlusion. (+) Non-functional teeth: #17 and #18      Maximum Interincisal Opening:  40 mm Other findings:   (+) Attrition/wear: #6-#11 incisal, #22-#27 incisal   RADIOGRAPHIC EXAM:  PAN and Full Mouth Series exposed and interpreted.  Condyles seated bilaterally in fossas.  No evidence of abnormal pathology.  All visualized osseous structures appear WNL. Missing teeth numbers 15 and 16. #17 appears slightly supra-erupted.  Generalized mild horizontal bone loss consistent with mild periodontitis.  Existing restorations noted on teeth numbers 3 and  14.  Caries- #2D.   ASSESSMENT:  1.  Metastatic cancer to cervical lymph nodes 2.  Malignant neoplasm of base of tongue 3.  Pre-radiotherapy dental examination 4.  Missing teeth 5.  Caries 6.  Periodontitis 7.  Accretions on teeth 8.  Gingival recession,  generalized 9.  Attrition/wear 10.  Mandibular tori 11.  Xerostomia   PROCEDURES: The common and significant side effects of radiation therapy to the head and neck were explained and discussed with the patient.  The discussion included side effects of trismus (limited opening), dysgeusia (loss of taste), xerostomia (dry mouth), radiation caries and osteoradionecrosis of the jaw.  I also discussed the importance of maintaining optimal oral hygiene and oral health before, during and after radiation to decrease the risk of developing radiation cavities and the need for any surgery such as extractions after therapy.    Upper and Lower alginate impressions taken and poured up in Type IV Microstone for fabrication of fluoride trays to deliver s/p radiation therapy.   Trismus appliance made using patient's baseline MIO (23 sticks).  Leta Speller, DAII demonstrated use of appliance.  Verbal and written postop instructions were given to the patient.   PLAN AND RECOMMENDATIONS: I discussed the risks, benefits, and complications of various scenarios with the patient in relationship to their medical and dental conditions, which included systemic infection or other serious issues such as osteoradionecrosis that could potentially occur either before, during or after their anticipated radiation therapy if dental/oral concerns are not addressed.  I explained that if any chronic or acute dental/oral infection(s) are addressed and subsequently not maintained following medical optimization and recovery, their risk of the previously mentioned complications are just as high and could potentially occur postoperatively.  I explained all significant findings of  the dental consultation with the patient including a few teeth with smaller cavities and his dry mouth, and the recommended care including continuing to see his regular dentist after radiation treatment and trying to use CLoSYs for dry mouth management in order to optimize them following radiation from a dental standpoint.  The patient verbalized understanding of all findings, discussion, and recommendations. We then discussed various treatment options to include no treatment, multiple extractions with alveoloplasty, pre-prosthetic surgery as indicated, periodontal therapy, dental restorations, root canal therapy, crown and bridge therapy, implant therapy, and replacement of missing teeth as indicated.  The patient verbalized understanding of all options.  We will see him one more time in the hospital clinic as well after he completes therapy to deliver his fluoride trays, for evaluation of side effects and then refer him back to his primary dentist for routine care.  He is agreeable to this plan. Plan to discuss all findings and recommendations with medical team and coordinate future care as needed.  The patient will need to return to their regular dentist for routine dental care including replacement of missing teeth as needed, cleanings and exams.  All questions and concerns were invited and addressed.  The patient tolerated today's visit well and departed in stable condition.  I spent in excess of 120 minutes during the conduct of this consultation and >50% of this time involved direct face-to-face encounter for counseling and/or coordination of the patient's care.  Clayton Benson Norway, D.M.D.

## 2020-11-18 NOTE — Patient Instructions (Signed)
[place] Department of Dental Medicine Derwood Becraft B. Darolyn Double, D.M.D. Phone: [phone] Fax: [phone]   It was a pleasure seeing you today!  Please refer to the information below regarding your dental visit with us.  Call if you have any questions or concerns that come up after you leave.   Thank you for letting us provide care for you.  If there is anything we can do for you, please let us know.    RADIATION THERAPY AND INFORMATION REGARDING YOUR TEETH   XEROSTOMIA (DRY MOUTH):  Your salivary glands may be in the field of radiation.  Radiation may include all or only part of your salivary glands.  This will cause your saliva to dry up, and you will have a dry mouth.  The dry mouth will be for the rest of your life unless your radiation oncologist tells you otherwise.  Your saliva has many functions: It wets your tongue for speaking. It coats your teeth and the inside of your mouth for easier movement. It helps with chewing and swallowing food. It helps clean away harmful acid and toxic products made by the germs in your mouth, therefore it helps prevent cavities. It kills some germs in your mouth and helps to prevent gum disease. It helps to carry flavor to your taste buds.  Once you have lost your saliva, you will be at higher risk for tooth decay and gum disease.    What can be done to help improve your mouth when there's not enough saliva? Your dentist may give a recommendation for CLoSYS.  It will not bring back all of your saliva but may bring back some of it.  Also, your saliva may be thick and ropy or white and foamy.  It will not feel like it use to feel. You will need to swish with water every time your mouth feels dry.  YOU CANNOT suck on any cough drops, mints, lemon drops, candy, vitamin C or any other products.  You cannot use anything other than water to make your mouth feel less dry.  If you want to drink anything else, you have to drink it all at once and brush  afterwards.  Be sure to discuss the details of your diet habits with your dentist or hygienist.   RADIATION CARIES:  This is decay (cavities) that happens very quickly once your mouth is very dry due to radiation therapy.  Normally, cavities take six months to two years to become a problem.  When you have dry mouth, cavities may take as little as eight weeks to cause you a problem.    Dental check-ups every two months are necessary as long as you have a dry mouth. Radiation caries typically, but not always, start at your gum line where it is hard to see the cavity.  It is therefore also hard to fill these cavities adequately.  This high rate of cavities happens because your mouth no longer has saliva and therefore the acid made by the germs starts the decay process.  Whenever you eat anything the germs in your mouth change the food into acid.  The acid then burns a small hole in your tooth.  This small hole is the beginning of a cavity.  If this is not treated then it will grow bigger and become a cavity.  The way to avoid this hole getting bigger is to use fluoride every evening as prescribed by your dentist following your radiation. NOTE:  You have to make sure   that your teeth are very clean before you use the fluoride.  This fluoride in turn will strengthen your teeth and prepare them for another day of fighting acid. If you develop radiation caries many times, the damage is so large that you will have to have all your teeth removed.  This could be a big problem if some of these teeth are in the field of radiation.  Further details of why this could be a big problem will follow (see Osteoradionecrosis below).   DYSGEUSIA (LOSS OF TASTE):  This happens to varying degrees once you've had radiation therapy to your jaw region.  Many times taste is not completely lost, but becomes limited.  The loss of taste is mostly due to radiation affecting your taste buds.  However, if you have no saliva in your mouth  to carry the flavor to your taste buds, it would be difficult for your taste buds to taste anything.  That is why using water or a prescription for [place]agen prior to meals and during meal times may help with some of the taste.  Keep in mind that taste generally returns very slowly over the course of several months or several years after radiation therapy.  Don't give up hope.   TRISMUS (LIMITED JAW OPENING):  According to your radiation oncologist, your TMJ or jaw joints are going to be partially or fully in the field of radiation.  This means that over time the muscles that help you open and close your mouth may get stiff.  This will potentially result in your not being able to open your mouth wide enough or as wide as you can open it now.    Let me give you an example of how slowly this happens and how unaware people are of it:   A gentlemen that had radiation therapy two years ago came back to me complaining that bananas are just too large for him to be able to fit them in between his teeth.  He was not able to open wide enough to bite into a banana.  This happens slowly and over a period of time.  What we do to try and prevent this:   Your dentist will probably give you a stack of sticks called a trismus exercise device.  This stack will help remind your muscles and your jaw joints to open up to the same distance every day.  Use these sticks every morning when you wake up, or according to the instructions given by your dentist.    You must use these sticks for at least one to two years after radiation therapy.  The reason for that is because it happens so slowly and keeps going on for about two years after radiation therapy.  Your hospital dentist will help you monitor your mouth opening and make sure that it's not getting smaller after radiation.  TRISMUS EXERCISES: Using the stack of sticks given to you by your dentist, place the stack in your mouth and hold onto the other end for support. Leave  the sticks in your mouth while holding the other end.  Allow 30 seconds for muscle stretching. Rest for a few seconds. Repeat 3-5 times. This exercise is recommended in the mornings and evenings unless otherwise instructed. The exercise should be done for a period of 2 YEARS after the end of radiation. Your maximum jaw opening should be checked regularly at recall dental visits by your general dentist. You should report any changes, soreness, or difficulties encountered   when doing the exercises to your dentist.   OSTEORADIONECROSIS (ORN):  This is a condition where your jaw bone after radiation therapy becomes very dry.  It has very little blood supply to keep it alive.  If you develop a cavity that turns into an abscess or an infection, then the jaw bone does not have enough blood supply to help fight the infection.  At this point it is very likely that the infection could cause the death of your jaw bone.  When you have dead bone it has to be removed.  Therefore, you might end up having to have surgery to remove part of your jaw bone, the part of the jaw bone that has been affected.     Healing is also a problem if you are to have surgery (like a tooth extraction) in the areas where the bone has had radiation therapy.  If you have surgery, you need more blood supply to heal which is not available.  When blood supply and oxygen are not available, there is a chance for the bone to die. Occasionally, ORN happens on its own with no obvious reason, but this is quite rare.  We believe that patients who continue to smoke and/or drink alcohol have a higher chance of having this problem. Once your jaw bone has had radiation therapy, if there are any remaining teeth in that area, it is not recommended to have them pulled unless your dentist or oral surgeon is aware of your history of radiation and believes it is safe.  The risks for ORN either from infection or spontaneously occurring (with no reason) are life  long.   QUESTIONS? Call our office during office hours at [phone].  

## 2020-11-20 ENCOUNTER — Ambulatory Visit
Admission: RE | Admit: 2020-11-20 | Discharge: 2020-11-20 | Disposition: A | Discharge status: Home / Self Care | Payer: BC Managed Care – PPO | Source: Ambulatory Visit | Attending: Radiation Oncology | Admitting: Radiation Oncology

## 2020-11-20 ENCOUNTER — Other Ambulatory Visit: Payer: Self-pay

## 2020-11-20 VITALS — BP 126/78 | HR 66 | Temp 97.7°F | Resp 18 | Wt 269.0 lb

## 2020-11-20 DIAGNOSIS — C770 Secondary and unspecified malignant neoplasm of lymph nodes of head, face and neck: Secondary | ICD-10-CM | POA: Insufficient documentation

## 2020-11-20 DIAGNOSIS — C01 Malignant neoplasm of base of tongue: Secondary | ICD-10-CM | POA: Insufficient documentation

## 2020-11-20 DIAGNOSIS — I890 Lymphedema, not elsewhere classified: Secondary | ICD-10-CM | POA: Diagnosis not present

## 2020-11-20 DIAGNOSIS — Z510 Encounter for antineoplastic radiation therapy: Secondary | ICD-10-CM | POA: Insufficient documentation

## 2020-11-20 DIAGNOSIS — R1310 Dysphagia, unspecified: Secondary | ICD-10-CM | POA: Diagnosis not present

## 2020-11-20 DIAGNOSIS — R293 Abnormal posture: Secondary | ICD-10-CM | POA: Diagnosis not present

## 2020-11-20 DIAGNOSIS — R5383 Other fatigue: Secondary | ICD-10-CM

## 2020-11-20 DIAGNOSIS — R590 Localized enlarged lymph nodes: Secondary | ICD-10-CM

## 2020-11-20 DIAGNOSIS — C779 Secondary and unspecified malignant neoplasm of lymph node, unspecified: Secondary | ICD-10-CM

## 2020-11-20 DIAGNOSIS — R5381 Other malaise: Secondary | ICD-10-CM

## 2020-11-20 DIAGNOSIS — Z483 Aftercare following surgery for neoplasm: Secondary | ICD-10-CM | POA: Diagnosis not present

## 2020-11-20 LAB — BUN & CREATININE (CHCC)
BUN: 15 mg/dL (ref 6–20)
Creatinine: 1.04 mg/dL (ref 0.61–1.24)
GFR, Estimated: >60 mL/min (ref >60)

## 2020-11-20 LAB — CBC WITH DIFFERENTIAL/PLATELET
Abs Immature Granulocytes: 0 10*3/uL (ref 0.00–0.07)
Basophils Absolute: 0.1 10*3/uL (ref 0.0–0.1)
Basophils Relative: 1 %
Eosinophils Absolute: 0.5 10*3/uL (ref 0.0–0.5)
Eosinophils Relative: 7 %
HCT: 43.8 % (ref 39.0–52.0)
Hemoglobin: 14.8 g/dL (ref 13.0–17.0)
Immature Granulocytes: 0 %
Lymphocytes Relative: 29 %
Lymphs Abs: 2 10*3/uL (ref 0.7–4.0)
MCH: 29.2 pg (ref 26.0–34.0)
MCHC: 33.8 g/dL (ref 30.0–36.0)
MCV: 86.4 fL (ref 80.0–100.0)
Monocytes Absolute: 0.4 10*3/uL (ref 0.1–1.0)
Monocytes Relative: 6 %
Neutro Abs: 4 10*3/uL (ref 1.7–7.7)
Neutrophils Relative %: 57 %
Platelets: 243 10*3/uL (ref 150–400)
RBC: 5.07 MIL/uL (ref 4.22–5.81)
RDW: 12.8 % (ref 11.5–15.5)
WBC: 6.9 10*3/uL (ref 4.0–10.5)
nRBC: 0 % (ref 0.0–0.2)

## 2020-11-20 LAB — TSH: TSH: 0.538 u[IU]/mL (ref 0.320–4.118)

## 2020-11-20 MED ORDER — SODIUM CHLORIDE 0.9% FLUSH
10.0000 mL | Freq: Once | INTRAVENOUS | Status: AC
  Administered 2020-11-20: 10 mL via INTRAVENOUS

## 2020-11-20 NOTE — Progress Notes (Signed)
Has armband been applied?  Yes.    Does patient have an allergy to IV contrast dye?: No.   Has patient ever received premedication for IV contrast dye?: No.   Date of lab work: November 20, 2020 BUN: 15 CR: 1.04 eGFR: >60  IV site: hand right, condition patent and no redness  Has IV site been added to flowsheet?  Yes.     BP 126/78 (Patient Position: Sitting)   Pulse 66   Temp 97.7 F (36.5 C) (Temporal)   Resp 18   Wt 269 lb (122 kg)   SpO2 98%   BMI 38.60 kg/m

## 2020-11-20 NOTE — Progress Notes (Signed)
Oncology Nurse Navigator Documentation   To provide support, encouragement and care continuity, met with  Gregory Carey and his wife during his CT SIM.  He tolerated procedure without difficulty, denied questions/concerns.   I encouraged him to call me prior to 11/28/20 [place].   Harlow Asa RN, BSN, OCN Head & Neck Oncology Nurse [place] [facility] Phone # [phone]  Fax # [phone]

## 2020-11-21 ENCOUNTER — Ambulatory Visit: Payer: BC Managed Care – PPO | Attending: Radiation Oncology

## 2020-11-21 ENCOUNTER — Encounter: Payer: Self-pay | Admitting: Physical Therapy

## 2020-11-21 ENCOUNTER — Ambulatory Visit: Payer: BC Managed Care – PPO | Admitting: Physical Therapy

## 2020-11-21 DIAGNOSIS — R1310 Dysphagia, unspecified: Secondary | ICD-10-CM | POA: Diagnosis not present

## 2020-11-21 DIAGNOSIS — R293 Abnormal posture: Secondary | ICD-10-CM | POA: Diagnosis not present

## 2020-11-21 DIAGNOSIS — Z483 Aftercare following surgery for neoplasm: Secondary | ICD-10-CM | POA: Diagnosis not present

## 2020-11-21 DIAGNOSIS — C01 Malignant neoplasm of base of tongue: Secondary | ICD-10-CM | POA: Insufficient documentation

## 2020-11-21 DIAGNOSIS — I890 Lymphedema, not elsewhere classified: Secondary | ICD-10-CM | POA: Insufficient documentation

## 2020-11-21 NOTE — Therapy (Signed)
[facility] [address] [place], [place], [address] Phone: [phone]   Fax:  [phone]  Speech Language Pathology Evaluation  Patient Details  Name: Gregory Carey MRN: [id number] Date of Birth: 1961-01-13 Referring Provider (SLP): Eppie Gibson, MD  Encounter Date: 11/21/2020   End of Session - 11/21/20 1432     Visit Number 1    Number of Visits 4    Date for SLP Re-Evaluation 02/19/21    SLP Start Time 0834    SLP Stop Time  6010    SLP Time Calculation (min) 40 min    Activity Tolerance Patient tolerated treatment well             Past Medical History:  Diagnosis Date   Cancer ([place])    neck   Complication of anesthesia    Diabetes mellitus without complication ([place])    Difficult intubation    Was infomed by MD Anesth his airway was small   Hyperlipidemia    Hypertension    Localized osteoarthritis of left shoulder    Osteoarthritis of left shoulder 09/29/2018   Sleep apnea    uses CPAP    Past Surgical History:  Procedure Laterality Date   BIOPSY  04/23/2020   Procedure: BIOPSY;  Surgeon: Eloise Harman, DO;  [facility];  Service: Endoscopy;;   COLONOSCOPY WITH PROPOFOL N/A 04/23/2020   Procedure: COLONOSCOPY WITH PROPOFOL;  Surgeon: Eloise Harman, DO;  [facility];  Service: Endoscopy;  Laterality: N/A;  am appt   DIRECT LARYNGOSCOPY N/A 10/23/2020   Procedure: DIRECT LARYNGOSCOPY WITH BIOPSIES;  Surgeon: Izora Gala, MD;  Location: [facility];  Service: ENT;  Laterality: N/A;   ESOPHAGOSCOPY N/A 10/23/2020   Procedure: ESOPHAGOSCOPY;  Surgeon: Izora Gala, MD;  Location: [facility];  Service: ENT;  Laterality: N/A;   KNEE ARTHROSCOPY Right 02/16/2006   POLYPECTOMY  04/23/2020   Procedure: POLYPECTOMY;  Surgeon: Eloise Harman, DO;  [facility];  Service: Endoscopy;;   RADICAL NECK DISSECTION Left 10/23/2020   Procedure: LEFT NECK DISSECTION;  Surgeon:  Izora Gala, MD;  Location: [facility];  Service: ENT;  Laterality: Left;   SHOULDER ARTHROSCOPY Right 02/17/2000   SKIN CANCER EXCISION     Multiple basal cell and squmous cell leisons removed   TOTAL SHOULDER ARTHROPLASTY Left 09/29/2018   Procedure: TOTAL SHOULDER ARTHROPLASTY;  Surgeon: Nicholes Stairs, MD;  Location: [facility];  Service: Orthopedics;  Laterality: Left;  2.5 hrs    There were no vitals filed for this visit.      SLP Evaluation [facility] - 11/21/20 9323       SLP Visit Information   SLP Received On 11/21/20    Referring Provider (SLP) Eppie Gibson, MD    Onset Date summer 2022    Medical Diagnosis Base of Tongue Cancer      Subjective   Patient/Family Stated Goal Maintain swallow function      General Information   HPI He presented to his PCP with a lump to the left side of his neck present for several months and given Augmentin. An [name] on 08/06/20 showing a pathologically enlarged left lymph node and pt referred to Dr. Chryl Heck who then referred him to Dr. Constance Holster. 08/30/20 Biopsy of left submandibular lymph node revealed poorly differentiated carcinoma. 09/02/20 Dr. Constance Holster did a fiberoptic Laryngoscopy which revealed no acute findings or obvious mass. 7/25 PET revealing hypermetabolic left-sided level II cervical lymphadenopathy. PET  also demonstrated relatively symmetric hypermetabolic FDG uptake identified in the anterior hypopharynx, and no discrete oro pharyngeal hypermetabolic lesion evident. Otherwise, no evidence of hypermetabolic metastatic disease was visualized in the chest, abdomen, or pelvis. 09/11/20 CT neck revealing two pathologic left level 2 lymph nodes as seen on prior PET-CT. No primary lesion or contralateral metastatic node was visualized. 10/23/20 patient underwent direct laryngoscopy by Dr. Constance Holster with biopsies taken from the L BOT and L Tonsil. L BOT revealed invasive moderate to poorly differentiated SCC and the L Tonsil was negative. He was also referred to Dr.  Nicolette Bang at [facility] for consideration of TORS. After evaluation it was decided Gregory Carey. He will receive 35 fractions of radiation to his BOT and bilateral neck. He had CT simulation first week of October  and will start rad tx 10/13.     Balance Screen   Has the patient fallen in the past 6 months No      Prior Functional Status   Cognitive/Linguistic Baseline Within functional limits      Cognition   Overall Cognitive Status Within Functional Limits for tasks assessed      Auditory Comprehension   Overall Auditory Comprehension Appears within functional limits for tasks assessed      Oral Motor/Sensory Function   Overall Oral Motor/Sensory Function Appears within functional limits for tasks assessed    Facial Sensation Reduced   slight on lt due to neck dissection     Motor Speech   Overall Motor Speech Appears within functional limits for tasks assessed            Pt had [place] sandwich and water today, observed by SLP. Thyroid elevation appeared adequate, and swallows appeared timely. Oral residue noted as minimal/WNL. Pt's swallow deemed WNL at this time.    Because data states the risk for dysphagia during and after radiation treatment is high due to undergoing radiation tx, SLP taught pt about the possibility of reduced/limited ability for PO intake during rad tx. SLP encouraged pt to continue swallowing POs as far into rad tx as possible, even ingesting POs and/or completing HEP shortly after administration of pain meds. Among other modifications for days when pt cannot functionally swallow, SLP talked about performing Carey non-swallowing tasks on the handout/HEP, and if necessary to cycle through the swallowing portion so the program of exercises can be completed instead of fatiguing on one of the swallowing exercises and not being able to perform the other swallowing exercises. Pt was then told to add all reps of swallowing tasks back in when it  becomes possible to do so.   SLP educated pt re: changes to swallowing musculature after rad tx, and why adherence to dysphagia HEP provided today and PO consumption was necessary to inhibit muscle fibrosis following rad tx. Pt demonstrated understanding of these things to SLP.    SLP then developed a HEP for pt and pt was instructed how to perform exercises involving lingual, vocal, and pharyngeal strengthening. SLP performed each exercise and pt return demonstrated each exercise. SLP ensured pt performance was correct prior to moving on to next exercise. Pt was instructed to complete this program 2-3 times a day, 6-7 days/week until 6 months after his last rad tx, then x2 a week after that.    SLP Short Term Goals - 11/21/20 1435                     SLP SHORT TERM GOAL #  1    Title pt will complete HEP with rare min A    Time 2    Period --   vists, for all STGs    Status New           SLP SHORT TERM GOAL #2    Title pt will tell SLP why pt is completing HEP with modified independence    Time 1    Status New           SLP SHORT TERM GOAL #3    Title pt will describe 3 overt s/s aspiration PNA with modified independence    Time 3    Status New           SLP SHORT TERM GOAL #4    Title pt will tell SLP how a food journal could hasten return to a more normalized diet    Time 3    Status New                     SLP Long Term Goals - 11/21/20 1438                     SLP LONG TERM GOAL #1    Title pt will complete HEP with modified independence over two visits    Time 4    Period --   visits, for all LTGs    Status New           SLP LONG TERM GOAL #2    Title pt will describe how to modify HEP over time, and the timeline associated with reduction in HEP frequency with modified independence over two sessions    Time 5    Status New                     Plan - 11/21/20 1440       Clinical Impression Statement At this time pt swallowing is deemed WNL/WFL with  regular diet/thin liquids. SLP designed an individualized HEP for dysphagia and pt completed each exercise on their own with consistent min-mod cues faded to modified independent. There are no overt s/s aspiration reported by pt at this time. Data indicate that pt's swallow ability will likely decrease over the course of radiation/chemotherapy and could very well decline over time following conclusion of their radiation therapy due to muscle disuse atrophy and/or muscle fibrosis. Pt will cont to need to be seen by SLP in order to assess safety of PO intake, assess the need for recommending any objective swallow assessment, and ensuring pt correctly completes the individualized HEP.    Speech Therapy Frequency --   once approx every four weeks    Duration --   90 days for this reporting period; overall, approx 7 visits    Treatment/Interventions Aspiration precaution training;Pharyngeal strengthening exercises;Diet toleration management by SLP;Trials of upgraded texture/liquids;Patient/family education;SLP instruction and feedback;Compensatory techniques    Potential to Achieve Goals Good    SLP Home Exercise Plan provided    Consulted and Agree with Plan of Care Patient                   SLP Education - 11/21/20 1431     Education Details late effects head/neck radiation on swallow ability, HEP procedure    Person(s) Educated Patient    Methods Explanation;Demonstration;Verbal cues;Handout    Comprehension Verbalized understanding;Returned demonstration;Verbal cues required;Need further instruction  Patient will benefit from skilled therapeutic intervention in order to improve the following deficits and impairments:   Dysphagia, unspecified type    Problem List Patient Active Problem List   Diagnosis Date Noted   Malignant neoplasm of base of tongue ([place]) 11/01/2020   Metastatic cancer to cervical lymph nodes ([place]) 10/23/2020   Metastatic squamous  cell carcinoma to lymph node ([place]) 09/13/2020   Osteoarthritis, knee 08/29/2019   OSA (obstructive sleep apnea) 02/26/2019   Osteoarthritis of left shoulder 09/29/2018   History of left shoulder replacement 09/29/2018   Diabetes mellitus, type 2 ([place]) 07/29/2015   Essential hypertension 04/29/2015   Hyperlipidemia 04/29/2015   Morbid obesity ([place]) 04/29/2015    [place] ,[place], [place]  11/21/2020, 2:33 PM  [facility] [address] [place], [place], [address] Phone: [phone]   Fax:  [phone]  Name: Gregory Carey MRN: [id number] Date of Birth: 1960-03-14

## 2020-11-21 NOTE — Therapy (Signed)
[facility] @ [place], [place], [address] Phone:     Fax:     Physical Therapy Evaluation  Patient Details  Name: Gregory Carey MRN: [id number] Date of Birth: 27-Apr-1960 Referring Provider (PT): Reita May Date: 11/21/2020   PT End of Session - 11/21/20 1020     Visit Number 1    Number of Visits 9    Date for PT Re-Evaluation 01/16/21    PT Start Time 0914    PT Stop Time 0958    PT Time Calculation (min) 44 min    Activity Tolerance Patient tolerated treatment well    Behavior During Therapy [facility] for tasks assessed/performed             Past Medical History:  Diagnosis Date   Cancer ([place])    neck   Complication of anesthesia    Diabetes mellitus without complication ([place])    Difficult intubation    Was infomed by MD Anesth his airway was small   Hyperlipidemia    Hypertension    Localized osteoarthritis of left shoulder    Osteoarthritis of left shoulder 09/29/2018   Sleep apnea    uses CPAP    Past Surgical History:  Procedure Laterality Date   BIOPSY  04/23/2020   Procedure: BIOPSY;  Surgeon: Eloise Harman, DO;  [facility];  Service: Endoscopy;;   COLONOSCOPY WITH PROPOFOL N/A 04/23/2020   Procedure: COLONOSCOPY WITH PROPOFOL;  Surgeon: Eloise Harman, DO;  [facility];  Service: Endoscopy;  Laterality: N/A;  am appt   DIRECT LARYNGOSCOPY N/A 10/23/2020   Procedure: DIRECT LARYNGOSCOPY WITH BIOPSIES;  Surgeon: Izora Gala, MD;  Location: [facility];  Service: ENT;  Laterality: N/A;   ESOPHAGOSCOPY N/A 10/23/2020   Procedure: ESOPHAGOSCOPY;  Surgeon: Izora Gala, MD;  Location: [facility];  Service: ENT;  Laterality: N/A;   KNEE ARTHROSCOPY Right 02/16/2006   POLYPECTOMY  04/23/2020   Procedure: POLYPECTOMY;  Surgeon: Eloise Harman, DO;  [facility];  Service: Endoscopy;;   RADICAL NECK DISSECTION Left 10/23/2020   Procedure: LEFT NECK  DISSECTION;  Surgeon: Izora Gala, MD;  Location: [facility];  Service: ENT;  Laterality: Left;   SHOULDER ARTHROSCOPY Right 02/17/2000   SKIN CANCER EXCISION     Multiple basal cell and squmous cell leisons removed   TOTAL SHOULDER ARTHROPLASTY Left 09/29/2018   Procedure: TOTAL SHOULDER ARTHROPLASTY;  Surgeon: Nicholes Stairs, MD;  Location: [facility];  Service: Orthopedics;  Laterality: Left;  2.5 hrs    There were no vitals filed for this visit.    Subjective Assessment - 11/21/20 0918     Subjective I am feeling pretty good considering everything. I still have some tightness in my neck. He had to take part of the muscle so I have been trying to work on it.    Pertinent History Metastatic SCC to lymph node and positive Base of Tongue SCC Stage 1 (T1, N1, M0, p 16 +), 08/30/20 Biopsy of left submandibular lymph node revealed poorly differentiated carcinoma, 7/25 PET revealing hypermetabolic left-sided level II cervical lymphadenopathy. PET also demonstrated relatively symmetric hypermetabolic FDG uptake identified in the anterior hypopharynx, and no discrete oro pharyngeal hypermetabolic lesion evident. Otherwise, no evidence of hypermetabolic metastatic disease was visualized in the chest, abdomen, or pelvis, 09/11/20 CT neck revealing two pathologic left level 2 lymph nodes as seen on prior PET-CT. No primary lesion  or contralateral metastatic node was visualized, 10/23/20 patient underwent direct laryngoscopy and neck dissection by Dr. Constance Holster with biopsies taken from the L BOT and L Tonsil. L BOT revealed invasive moderate to poorly differentiated SCC and the L Tonsil was negative, will receive 35 fractions of radiation to his BOT and bilateral neck. He had CT simulation today and will start 10/13, L shoulder replacement 10 yrs ago, pt reports he needs both knees replaced as well, diabetes    Patient Stated Goals to gain info from providers    Currently in Pain? No/denies    Multiple Pain Sites No                 [facility] PT Assessment - 11/21/20 0001       Assessment   Medical Diagnosis base of tongue cancer    Referring Provider (PT) [place]    Onset Date/Surgical Date 08/30/20    Hand Dominance Right    Prior Therapy none recently      Precautions   Precautions Other (comment)    Precaution Comments active cancer      Restrictions   Weight Bearing Restrictions No      Balance Screen   Has the patient fallen in the past 6 months No    Has the patient had a decrease in activity level because of a fear of falling?  No    Is the patient reluctant to leave their home because of a fear of falling?  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, lifting, pushing    Leisure pt reports he does not exercise      Cognition   Overall Cognitive Status Within Functional Limits for tasks assessed      Observation/Other Assessments   Observations anterior neck swelling, healed scar on L side of neck from neck dissection      Functional Tests   Functional tests Sit to Stand      Sit to Stand   Comments 30 sec sit to stand: 13 reps - pt reports he feels a little winded, avg for his age is 44      Posture/Postural Control   Posture/Postural Control Postural limitations    Postural Limitations Rounded Shoulders;Forward head      AROM   Cervical Flexion WFL    Cervical Extension 25% limited    Cervical - Right Side Bend 50% limited    Cervical - Left Side Bend WFL    Cervical - Right Rotation WFL    Cervical - Left Rotation [facility]      Ambulation/Gait   Ambulation/Gait Yes    Ambulation/Gait Assistance 7: Independent    Ambulation Distance (Feet) 10 Feet    Gait Pattern  Within Functional Limits               LYMPHEDEMA/ONCOLOGY QUESTIONNAIRE - 11/21/20 0001       Lymphedema Assessments   Lymphedema Assessments Head and Neck      Head and Neck   4 cm superior to sternal notch around neck 48.9 cm    6 cm superior to sternal notch around neck 47.6 cm  8 cm superior to sternal notch around neck 47.8 cm    Other 48.3   10 cm superior to sternal notch around neck                    Objective measurements completed on examination: See above findings.                PT Education - 11/21/20 1019     Education Details ABC class, post op shoulder ROM exercises, anatomy and physiology of lymphatic system, signs and symptoms of lymphedema, SOZO, lymphedema risk reduction practices, FlexiTouch compression pump    Person(s) Educated Patient;Spouse    Methods Explanation;Handout    Comprehension Verbalized understanding                 PT Long Term Goals - 11/21/20 1022       PT LONG TERM GOAL #1   Title Pt will be independent in self MLD for long term management of lymphedema.    Time 8    Period Weeks    Status New    Target Date 01/16/21      PT LONG TERM GOAL #2   Title Pt will demonstrate full cervical ROM in all directions to allow pt to return to PLOF.    Baseline R lateral flexion and extension are limited    Time 8    Period Weeks    Status New    Target Date 01/16/21      PT LONG TERM GOAL #3   Title Pt will receive trial of FlexiTouch compression pump for long term management of lymphedema.    Time 8    Period Weeks    Status New    Target Date 01/16/21      PT LONG TERM GOAL #4   Title Pt will obtain appropriate compression garment for long term management of head and neck lymphedema.    Time 8    Period Weeks    Status New    Target Date 01/16/21                Head and Neck Clinic Goals - 11/21/20 1022       Patient will be able to verbalize understanding of a home exercise  program for cervical range of motion, posture, and walking.          Time 1    Period Days    Status Achieved      Patient will be able to verbalize understanding of proper sitting and standing posture.          Time 1    Period Days    Status Achieved      Patient will be able to verbalize understanding of lymphedema risk and availability of treatment for this condition.          Time 1    Period Days    Status Achieved                Plan - 11/21/20 1024     Clinical Impression Statement Pt reports to PT with recently diagnosed base of tongue cancer. He underwent a left neck dissection on 9/7. He will begin radiation next week to base of tongue and bilateral neck. He still presents with edema in anterior neck following neck dissection which is most likely lymphedema at this point since it has not resolved. His risk of it worsening is high since he is beginning radiation. He has some  tightness following surgery in regards to cervical ROM most notably with R lateral flexion and extension. Pt would benefit from skilled PT services to improve cervical ROM, decrease scar tissue, decrease neck lymphedema and assist pt with obtaining appropriate compression garments.    Stability/Clinical Decision Making Stable/Uncomplicated    Clinical Decision Making Low    Rehab Potential Good    PT Frequency 1x / week    PT Duration 8 weeks    PT Treatment/Interventions ADLs/Self Care Home Management;Therapeutic exercise;Patient/family education;Manual techniques;Manual lymph drainage;Compression bandaging;Scar mobilization;Passive range of motion;Taping;Vasopneumatic Device    PT Next Visit Plan begin MLD and instruct pt and spouse, did flexi send benefit info, neck ROM    PT Home Exercise Plan head and neck ROM exercises    Consulted and Agree with Plan of Care Patient;Family member/caregiver    Family Member Consulted spouse             Patient will benefit from skilled therapeutic  intervention in order to improve the following deficits and impairments:  Postural dysfunction, Impaired flexibility, Increased fascial restricitons, Decreased knowledge of precautions, Decreased range of motion, Decreased scar mobility, Increased edema  Visit Diagnosis: Lymphedema, not elsewhere classified  Aftercare following surgery for neoplasm  Abnormal posture  Malignant neoplasm of base of tongue ([place])     Problem List Patient Active Problem List   Diagnosis Date Noted   Malignant neoplasm of base of tongue ([place]) 11/01/2020   Metastatic cancer to cervical lymph nodes ([place]) 10/23/2020   Metastatic squamous cell carcinoma to lymph node ([place]) 09/13/2020   Osteoarthritis, knee 08/29/2019   OSA (obstructive sleep apnea) 02/26/2019   Osteoarthritis of left shoulder 09/29/2018   History of left shoulder replacement 09/29/2018   Diabetes mellitus, type 2 ([place]) 07/29/2015   Essential hypertension 04/29/2015   Hyperlipidemia 04/29/2015   Morbid obesity ([place]) 04/29/2015    Jaymir Struble Breedlove Hornbrook, PT 11/21/2020, 10:31 AM  [facility] @ [place], [place], [address] Phone:     Fax:     Name: DERRON PIPKINS MRN: [id number] Date of Birth: 1960/06/24   Manus Gunning, PT 11/21/20 10:32 AM

## 2020-11-21 NOTE — Patient Instructions (Signed)
SWALLOWING EXERCISES Do these until 6 months after your last day of radiation, then 2-3 times per week afterwards  Effortful Swallows - Press your tongue against the roof of your mouth for 3 seconds, then squeeze the muscles in your neck while you swallow your saliva or a sip of water - Repeat 10-15 times, 2-3 times a day, and use whenever you eat or drink  [place] Swallow - swallow with your tongue sticking out - Stick tongue out past your lips and gently bite tongue with your teeth - Swallow, while holding your tongue with your teeth - Repeat 10-15 times, 2-3 times a day *use a wet spoon if your mouth gets dry*  Shaker Exercise - head lift - Lie flat on your back in your bed or on a couch without pillows - Raise your head and look at your feet - KEEP YOUR SHOULDERS DOWN - HOLD FOR 45-60 SECONDS, then lower your head back down - Repeat 3 times, 2-3 times a day  Chin pushback - Open your mouth  - Place your fist UNDER your chin near your neck - Tuck your chin and push back with your fist for 5 seconds - Repeat 10 times, 2-3 times a day        5.  "Super Swallow"  - Take a breath and hold it  - Bear down (like pushing your bowels)  - Swallow then IMMEDIATELY cough  - Repeat 10 times, 2-3 times a day

## 2020-11-21 NOTE — Progress Notes (Signed)
Oncology Nurse Navigator Documentation   Gregory Carey and his wife presented for Head and Neck MDC today. They will see Garald Balding SLP and Allyson Sabal PT for assessment and evaluation. They deny any questions or concerns at this time and know to call me with any future needs.   Harlow Asa RN, BSN, OCN Head & Neck Oncology Nurse [place] [facility] Phone # [phone]  Fax # [phone]

## 2020-11-25 DIAGNOSIS — R293 Abnormal posture: Secondary | ICD-10-CM | POA: Diagnosis not present

## 2020-11-25 DIAGNOSIS — R1310 Dysphagia, unspecified: Secondary | ICD-10-CM | POA: Diagnosis not present

## 2020-11-25 DIAGNOSIS — I890 Lymphedema, not elsewhere classified: Secondary | ICD-10-CM | POA: Diagnosis not present

## 2020-11-25 DIAGNOSIS — Z483 Aftercare following surgery for neoplasm: Secondary | ICD-10-CM | POA: Diagnosis not present

## 2020-11-25 DIAGNOSIS — C01 Malignant neoplasm of base of tongue: Secondary | ICD-10-CM | POA: Diagnosis not present

## 2020-11-25 DIAGNOSIS — C770 Secondary and unspecified malignant neoplasm of lymph nodes of head, face and neck: Secondary | ICD-10-CM | POA: Diagnosis not present

## 2020-11-27 ENCOUNTER — Encounter: Payer: Self-pay | Admitting: Physical Therapy

## 2020-11-27 ENCOUNTER — Ambulatory Visit: Payer: BC Managed Care – PPO | Admitting: Physical Therapy

## 2020-11-27 ENCOUNTER — Other Ambulatory Visit: Payer: Self-pay

## 2020-11-27 DIAGNOSIS — I890 Lymphedema, not elsewhere classified: Secondary | ICD-10-CM | POA: Diagnosis not present

## 2020-11-27 DIAGNOSIS — R293 Abnormal posture: Secondary | ICD-10-CM

## 2020-11-27 DIAGNOSIS — R1310 Dysphagia, unspecified: Secondary | ICD-10-CM | POA: Diagnosis not present

## 2020-11-27 DIAGNOSIS — Z483 Aftercare following surgery for neoplasm: Secondary | ICD-10-CM

## 2020-11-27 DIAGNOSIS — C01 Malignant neoplasm of base of tongue: Secondary | ICD-10-CM

## 2020-11-27 NOTE — Therapy (Signed)
[facility] @ [place], [place], [address] Phone: [phone]   Fax:  [phone]  Physical Therapy Treatment  Patient Details  Name: Gregory Carey MRN: [id number] Date of Birth: Feb 27, 1960 Referring Provider (PT): Isidore Moos   Encounter Date: 11/27/2020   PT End of Session - 11/27/20 0954     Visit Number 2    Number of Visits 9    Date for PT Re-Evaluation 01/16/21    PT Start Time 0908   some time not billable due to therapist having to step out of room   PT Stop Time 0953    PT Time Calculation (min) 45 min    Activity Tolerance Patient tolerated treatment well    Behavior During Therapy [facility] for tasks assessed/performed             Past Medical History:  Diagnosis Date   Cancer ([place])    neck   Complication of anesthesia    Diabetes mellitus without complication ([place])    Difficult intubation    Was infomed by MD Anesth his airway was small   Hyperlipidemia    Hypertension    Localized osteoarthritis of left shoulder    Osteoarthritis of left shoulder 09/29/2018   Sleep apnea    uses CPAP    Past Surgical History:  Procedure Laterality Date   BIOPSY  04/23/2020   Procedure: BIOPSY;  Surgeon: Eloise Harman, DO;  [facility];  Service: Endoscopy;;   COLONOSCOPY WITH PROPOFOL N/A 04/23/2020   Procedure: COLONOSCOPY WITH PROPOFOL;  Surgeon: Eloise Harman, DO;  [facility];  Service: Endoscopy;  Laterality: N/A;  am appt   DIRECT LARYNGOSCOPY N/A 10/23/2020   Procedure: DIRECT LARYNGOSCOPY WITH BIOPSIES;  Surgeon: Izora Gala, MD;  Location: [facility];  Service: ENT;  Laterality: N/A;   ESOPHAGOSCOPY N/A 10/23/2020   Procedure: ESOPHAGOSCOPY;  Surgeon: Izora Gala, MD;  Location: [facility];  Service: ENT;  Laterality: N/A;   KNEE ARTHROSCOPY Right 02/16/2006   POLYPECTOMY  04/23/2020   Procedure: POLYPECTOMY;  Surgeon: Eloise Harman, DO;  [facility];   Service: Endoscopy;;   RADICAL NECK DISSECTION Left 10/23/2020   Procedure: LEFT NECK DISSECTION;  Surgeon: Izora Gala, MD;  Location: [facility];  Service: ENT;  Laterality: Left;   SHOULDER ARTHROSCOPY Right 02/17/2000   SKIN CANCER EXCISION     Multiple basal cell and squmous cell leisons removed   TOTAL SHOULDER ARTHROPLASTY Left 09/29/2018   Procedure: TOTAL SHOULDER ARTHROPLASTY;  Surgeon: Nicholes Stairs, MD;  Location: [facility];  Service: Orthopedics;  Laterality: Left;  2.5 hrs    There were no vitals filed for this visit.   Subjective Assessment - 11/27/20 0908     Subjective My neck is feeling pretty good today. It still has some stiffness to it.    Pertinent History Metastatic SCC to lymph node and positive Base of Tongue SCC Stage 1 (T1, N1, M0, p 16 +), 08/30/20 Biopsy of left submandibular lymph node revealed poorly differentiated carcinoma, 7/25 PET revealing hypermetabolic left-sided level II cervical lymphadenopathy. PET also demonstrated relatively symmetric hypermetabolic FDG uptake identified in the anterior hypopharynx, and no discrete oro pharyngeal hypermetabolic lesion evident. Otherwise, no evidence of hypermetabolic metastatic disease was visualized in the chest, abdomen, or pelvis, 09/11/20 CT neck revealing two pathologic left level 2 lymph nodes as seen on prior PET-CT. No primary lesion or contralateral metastatic node was visualized, 10/23/20  patient underwent direct laryngoscopy and neck dissection by Dr. Constance Holster with biopsies taken from the L BOT and L Tonsil. L BOT revealed invasive moderate to poorly differentiated SCC and the L Tonsil was negative, will receive 35 fractions of radiation to his BOT and bilateral neck. He had CT simulation today and will start 10/13, L shoulder replacement 10 yrs ago, pt reports he needs both knees replaced as well, diabetes    Patient Stated Goals to gain info from providers    Currently in Pain? No/denies                                [facility] Adult PT Treatment/Exercise - 11/27/20 0001       Manual Therapy   Manual Therapy Manual Lymphatic Drainage (MLD);Edema management    Edema Management created foam chip pack in TG soft 7 for pt to wear around his head for compression - educated pt not to tie it too tightly    Manual Lymphatic Drainage (MLD) in supine: short neck, 5 diaphragmatic breaths, bilateral axillary nodes, bilateral pectoral nodes, bilateral chest, short neck, posterior, lateral and anterior neck then retracing all steps while instructing pt in anatomy and physiology of the lymphatic system and proper sequence and skin stretch technique                          PT Long Term Goals - 11/21/20 1022       PT LONG TERM GOAL #1   Title Pt will be independent in self MLD for long term management of lymphedema.    Time 8    Period Weeks    Status New    Target Date 01/16/21      PT LONG TERM GOAL #2   Title Pt will demonstrate full cervical ROM in all directions to allow pt to return to PLOF.    Baseline R lateral flexion and extension are limited    Time 8    Period Weeks    Status New    Target Date 01/16/21      PT LONG TERM GOAL #3   Title Pt will receive trial of FlexiTouch compression pump for long term management of lymphedema.    Time 8    Period Weeks    Status New    Target Date 01/16/21      PT LONG TERM GOAL #4   Title Pt will obtain appropriate compression garment for long term management of head and neck lymphedema.    Time 8    Period Weeks    Status New    Target Date 01/16/21                   Plan - 11/27/20 0956     Clinical Impression Statement Pt returns to PT and MLD was initiated to anterior neck. Educated pt in correct technique and sequence for self MLD while therapist performed. Will issue handout at next session and have pt return demonstrate. Educated pt on importance of proper skin stretch. Created foam  chip pack for pt to start wearing at home to reduce lymphedema.    PT Frequency 1x / week    PT Duration 8 weeks    PT Treatment/Interventions ADLs/Self Care Home Management;Therapeutic exercise;Patient/family education;Manual techniques;Manual lymph drainage;Compression bandaging;Scar mobilization;Passive range of motion;Taping;Vasopneumatic Device    PT Next Visit Plan how was chip pack?, cont  MLD and instruct pt and spouse and issue handout, did flexi send benefit info, neck ROM    PT Home Exercise Plan head and neck ROM exercises, wear chip pack    Consulted and Agree with Plan of Care Patient             Patient will benefit from skilled therapeutic intervention in order to improve the following deficits and impairments:  Postural dysfunction, Impaired flexibility, Increased fascial restricitons, Decreased knowledge of precautions, Decreased range of motion, Decreased scar mobility, Increased edema  Visit Diagnosis: Lymphedema, not elsewhere classified  Aftercare following surgery for neoplasm  Abnormal posture  Malignant neoplasm of base of tongue ([place])     Problem List Patient Active Problem List   Diagnosis Date Noted   Malignant neoplasm of base of tongue ([place]) 11/01/2020   Metastatic cancer to cervical lymph nodes ([place]) 10/23/2020   Metastatic squamous cell carcinoma to lymph node ([place]) 09/13/2020   Osteoarthritis, knee 08/29/2019   OSA (obstructive sleep apnea) 02/26/2019   Osteoarthritis of left shoulder 09/29/2018   History of left shoulder replacement 09/29/2018   Diabetes mellitus, type 2 ([place]) 07/29/2015   Essential hypertension 04/29/2015   Hyperlipidemia 04/29/2015   Morbid obesity ([place]) 04/29/2015    Salvador Bigbee Breedlove Riva, PT 11/27/2020, 9:59 AM  [facility] @ [place], [place], [address] Phone: [phone]   Fax:  [phone]  Name: Gregory Carey MRN: [id number] Date of  Birth: [birth year]   Manus Gunning, PT 11/27/20 9:59 AM

## 2020-11-28 ENCOUNTER — Ambulatory Visit
Admission: RE | Admit: 2020-11-28 | Discharge: 2020-11-28 | Disposition: A | Discharge status: Home / Self Care | Payer: BC Managed Care – PPO | Source: Ambulatory Visit | Attending: Radiation Oncology | Admitting: Radiation Oncology

## 2020-11-28 ENCOUNTER — Inpatient Hospital Stay: Payer: BC Managed Care – PPO | Attending: Hematology and Oncology | Admitting: Dietician

## 2020-11-28 DIAGNOSIS — C01 Malignant neoplasm of base of tongue: Secondary | ICD-10-CM | POA: Diagnosis not present

## 2020-11-28 DIAGNOSIS — I890 Lymphedema, not elsewhere classified: Secondary | ICD-10-CM | POA: Diagnosis not present

## 2020-11-28 DIAGNOSIS — Z483 Aftercare following surgery for neoplasm: Secondary | ICD-10-CM | POA: Diagnosis not present

## 2020-11-28 DIAGNOSIS — C770 Secondary and unspecified malignant neoplasm of lymph nodes of head, face and neck: Secondary | ICD-10-CM | POA: Diagnosis not present

## 2020-11-28 DIAGNOSIS — R293 Abnormal posture: Secondary | ICD-10-CM | POA: Diagnosis not present

## 2020-11-28 DIAGNOSIS — R1310 Dysphagia, unspecified: Secondary | ICD-10-CM | POA: Diagnosis not present

## 2020-11-28 NOTE — Progress Notes (Signed)
Nutrition Follow-up:  Patient receiving definitive radiation therapy for base of tongue cancer. He is s/p first radiation treatment.   Met with patient and wife in clinic. He reports tolerating first radiation therapy well. Patient reports appetite is good, continues tolerating regular textures. He denies nutrition impact symptoms. Patient has been working to increase his fluid intake, reports keeping glasses of water in different areas of the house that he will drink from when walking by. Patient biscuits, gravy, sausage, coffee for breakfast. Yesterday he had chicken/apple salad, butter pecan ice cream, tea for lunch, sandwich, cottage cheese, asian pear for dinner. Patient wife has a pot roast in the crock pot for supper tonight.   Medications: reviewed  Labs: reviewed  Anthropometrics: Last weight 269 lb on 10/5 increased from 266.5 lb on 9/19   NUTRITION DIAGNOSIS: Predicted suboptimal intake stable    INTERVENTION:  Continue eating regular textures as tolerated and including foods with protein with meals and snacks Continue strategies for increasing fluid intake Patient has contact information     MONITORING, EVALUATION, GOAL: weight trends, intake    NEXT VISIT: Tuesday October 18

## 2020-11-28 NOTE — Progress Notes (Signed)
Oncology Nurse Navigator Documentation   Mr. Crossley received his first treatment of radiation without difficult today. He and his wife know to call me if they need anything.  Harlow Asa RN, BSN, OCN Head & Neck Oncology Nurse [place] at [facility] Phone # [phone]  Fax # [phone]

## 2020-11-29 ENCOUNTER — Other Ambulatory Visit: Payer: Self-pay

## 2020-11-29 ENCOUNTER — Ambulatory Visit
Admission: RE | Admit: 2020-11-29 | Discharge: 2020-11-29 | Disposition: A | Discharge status: Home / Self Care | Payer: BC Managed Care – PPO | Source: Ambulatory Visit | Attending: Radiation Oncology | Admitting: Radiation Oncology

## 2020-11-29 DIAGNOSIS — R293 Abnormal posture: Secondary | ICD-10-CM | POA: Diagnosis not present

## 2020-11-29 DIAGNOSIS — C770 Secondary and unspecified malignant neoplasm of lymph nodes of head, face and neck: Secondary | ICD-10-CM | POA: Diagnosis not present

## 2020-11-29 DIAGNOSIS — R1310 Dysphagia, unspecified: Secondary | ICD-10-CM | POA: Diagnosis not present

## 2020-11-29 DIAGNOSIS — C01 Malignant neoplasm of base of tongue: Secondary | ICD-10-CM | POA: Diagnosis not present

## 2020-11-29 DIAGNOSIS — Z483 Aftercare following surgery for neoplasm: Secondary | ICD-10-CM | POA: Diagnosis not present

## 2020-11-29 DIAGNOSIS — I890 Lymphedema, not elsewhere classified: Secondary | ICD-10-CM | POA: Diagnosis not present

## 2020-12-02 ENCOUNTER — Ambulatory Visit
Admission: RE | Admit: 2020-12-02 | Discharge: 2020-12-02 | Disposition: A | Discharge status: Home / Self Care | Payer: BC Managed Care – PPO | Source: Ambulatory Visit | Attending: Radiation Oncology | Admitting: Radiation Oncology

## 2020-12-02 ENCOUNTER — Other Ambulatory Visit: Payer: Self-pay

## 2020-12-02 DIAGNOSIS — C01 Malignant neoplasm of base of tongue: Secondary | ICD-10-CM | POA: Diagnosis not present

## 2020-12-02 DIAGNOSIS — R1310 Dysphagia, unspecified: Secondary | ICD-10-CM | POA: Diagnosis not present

## 2020-12-02 DIAGNOSIS — C770 Secondary and unspecified malignant neoplasm of lymph nodes of head, face and neck: Secondary | ICD-10-CM | POA: Diagnosis not present

## 2020-12-02 DIAGNOSIS — I890 Lymphedema, not elsewhere classified: Secondary | ICD-10-CM | POA: Diagnosis not present

## 2020-12-02 DIAGNOSIS — R293 Abnormal posture: Secondary | ICD-10-CM | POA: Diagnosis not present

## 2020-12-02 DIAGNOSIS — Z483 Aftercare following surgery for neoplasm: Secondary | ICD-10-CM | POA: Diagnosis not present

## 2020-12-03 ENCOUNTER — Ambulatory Visit
Admission: RE | Admit: 2020-12-03 | Discharge: 2020-12-03 | Disposition: A | Discharge status: Home / Self Care | Payer: BC Managed Care – PPO | Source: Ambulatory Visit | Attending: Radiation Oncology | Admitting: Radiation Oncology

## 2020-12-03 ENCOUNTER — Inpatient Hospital Stay: Payer: BC Managed Care – PPO | Admitting: Dietician

## 2020-12-03 ENCOUNTER — Other Ambulatory Visit: Payer: Self-pay

## 2020-12-03 DIAGNOSIS — I890 Lymphedema, not elsewhere classified: Secondary | ICD-10-CM | POA: Diagnosis not present

## 2020-12-03 DIAGNOSIS — R1310 Dysphagia, unspecified: Secondary | ICD-10-CM | POA: Diagnosis not present

## 2020-12-03 DIAGNOSIS — R293 Abnormal posture: Secondary | ICD-10-CM | POA: Diagnosis not present

## 2020-12-03 DIAGNOSIS — Z483 Aftercare following surgery for neoplasm: Secondary | ICD-10-CM | POA: Diagnosis not present

## 2020-12-03 DIAGNOSIS — C01 Malignant neoplasm of base of tongue: Secondary | ICD-10-CM | POA: Diagnosis not present

## 2020-12-03 DIAGNOSIS — C770 Secondary and unspecified malignant neoplasm of lymph nodes of head, face and neck: Secondary | ICD-10-CM | POA: Diagnosis not present

## 2020-12-03 NOTE — Progress Notes (Signed)
Nutrition Follow-up:  Patient receiving definitive radiation therapy for base of tongue cancer.   Met with patient and wife in clinic. He reports tolerating treatment well, denies nutrition impact symptoms. Patient reports appetite is good, tolerating regular textures. He has been drinking more fluids, continues to have multiple glasses filled in various parts of the house that he sips on through out the day. Patient ate fried potatoes, bacon/egg sandwich on toast and cup of coffee for breakfast this morning. Patient recalls chicken wings, apple fritter for lunch yesterday. He ate BBQ, slaw, glass of tea for dinner last night.   Medications: reviewed  Labs: No new labs for review  Anthropometrics: Last weight 269.6 lb on 10/17 stable. Patient weighed 269 lb on 10/5 increased from 266.5 on 9/19    NUTRITION DIAGNOSIS: Predicated suboptimal intake stable   INTERVENTION:  Continue eating regular textures as tolerated Continue strategies for increasing fluid intake Encouraged high protein snacks in between meals, pt has handout Patient has contact information    MONITORING, EVALUATION, GOAL: weight trends, intake   NEXT VISIT: Friday October 28 with Barbara    

## 2020-12-04 ENCOUNTER — Ambulatory Visit
Admission: RE | Admit: 2020-12-04 | Discharge: 2020-12-04 | Disposition: A | Discharge status: Home / Self Care | Payer: BC Managed Care – PPO | Source: Ambulatory Visit | Attending: Radiation Oncology | Admitting: Radiation Oncology

## 2020-12-04 DIAGNOSIS — R1310 Dysphagia, unspecified: Secondary | ICD-10-CM | POA: Diagnosis not present

## 2020-12-04 DIAGNOSIS — C770 Secondary and unspecified malignant neoplasm of lymph nodes of head, face and neck: Secondary | ICD-10-CM | POA: Diagnosis not present

## 2020-12-04 DIAGNOSIS — Z483 Aftercare following surgery for neoplasm: Secondary | ICD-10-CM | POA: Diagnosis not present

## 2020-12-04 DIAGNOSIS — C01 Malignant neoplasm of base of tongue: Secondary | ICD-10-CM | POA: Diagnosis not present

## 2020-12-04 DIAGNOSIS — I890 Lymphedema, not elsewhere classified: Secondary | ICD-10-CM | POA: Diagnosis not present

## 2020-12-04 DIAGNOSIS — R293 Abnormal posture: Secondary | ICD-10-CM | POA: Diagnosis not present

## 2020-12-05 ENCOUNTER — Other Ambulatory Visit: Payer: Self-pay

## 2020-12-05 ENCOUNTER — Ambulatory Visit: Payer: BC Managed Care – PPO | Admitting: Physical Therapy

## 2020-12-05 ENCOUNTER — Ambulatory Visit
Admission: RE | Admit: 2020-12-05 | Discharge: 2020-12-05 | Disposition: A | Discharge status: Home / Self Care | Payer: BC Managed Care – PPO | Source: Ambulatory Visit | Attending: Radiation Oncology | Admitting: Radiation Oncology

## 2020-12-05 DIAGNOSIS — C01 Malignant neoplasm of base of tongue: Secondary | ICD-10-CM

## 2020-12-05 DIAGNOSIS — C770 Secondary and unspecified malignant neoplasm of lymph nodes of head, face and neck: Secondary | ICD-10-CM | POA: Diagnosis not present

## 2020-12-05 DIAGNOSIS — R293 Abnormal posture: Secondary | ICD-10-CM

## 2020-12-05 DIAGNOSIS — I890 Lymphedema, not elsewhere classified: Secondary | ICD-10-CM | POA: Diagnosis not present

## 2020-12-05 DIAGNOSIS — Z483 Aftercare following surgery for neoplasm: Secondary | ICD-10-CM

## 2020-12-05 DIAGNOSIS — R1310 Dysphagia, unspecified: Secondary | ICD-10-CM | POA: Diagnosis not present

## 2020-12-05 MED ORDER — SONAFINE EX EMUL
1.0000 "application " | Freq: Two times a day (BID) | CUTANEOUS | Status: DC
  Administered 2020-12-05: 1 via TOPICAL

## 2020-12-05 NOTE — Progress Notes (Signed)
Pt here for patient teaching.    Pt given Radiation and You booklet, Managing Acute Radiation Side Effects for Head and Neck Cancer handout, skin care instructions, and Sonafine.    Reviewed areas of pertinence such as fatigue, hair loss, mouth changes, skin changes, throat changes, earaches, and taste changes .   Pt able to give teach back of to pat skin, use unscented/gentle soap, and drink plenty of water,apply Sonafine bid, avoid applying anything to skin within 4 hours of treatment, and to use an electric razor if they must shave.   Pt demonstrated understanding and verbalizes understanding of information given and will contact nursing with any questions or concerns.    Http://[email or web address]/treatmentinformation/whattoexpect/index         

## 2020-12-05 NOTE — Therapy (Signed)
[facility] @ [place], [place], [address] Phone: [phone]   Fax:  [phone]  Physical Therapy Treatment  Patient Details  Name: Gregory Carey MRN: [id number] Date of Birth: 1960/06/05 Referring Provider (PT): Reita May Date: 12/05/2020   PT End of Session - 12/05/20 0849     Visit Number 3    Number of Visits 9    Date for PT Re-Evaluation 01/16/21    PT Start Time 0809    PT Stop Time 0848    PT Time Calculation (min) 39 min    Activity Tolerance Patient tolerated treatment well    Behavior During Therapy [facility] for tasks assessed/performed             Past Medical History:  Diagnosis Date   Cancer ([place])    neck   Complication of anesthesia    Diabetes mellitus without complication ([place])    Difficult intubation    Was infomed by MD Anesth his airway was small   Hyperlipidemia    Hypertension    Localized osteoarthritis of left shoulder    Osteoarthritis of left shoulder 09/29/2018   Sleep apnea    uses CPAP    Past Surgical History:  Procedure Laterality Date   BIOPSY  04/23/2020   Procedure: BIOPSY;  Surgeon: Eloise Harman, DO;  [facility];  Service: Endoscopy;;   COLONOSCOPY WITH PROPOFOL N/A 04/23/2020   Procedure: COLONOSCOPY WITH PROPOFOL;  Surgeon: Eloise Harman, DO;  [facility];  Service: Endoscopy;  Laterality: N/A;  am appt   DIRECT LARYNGOSCOPY N/A 10/23/2020   Procedure: DIRECT LARYNGOSCOPY WITH BIOPSIES;  Surgeon: Izora Gala, MD;  Location: [facility];  Service: ENT;  Laterality: N/A;   ESOPHAGOSCOPY N/A 10/23/2020   Procedure: ESOPHAGOSCOPY;  Surgeon: Izora Gala, MD;  Location: [facility];  Service: ENT;  Laterality: N/A;   KNEE ARTHROSCOPY Right 02/16/2006   POLYPECTOMY  04/23/2020   Procedure: POLYPECTOMY;  Surgeon: Eloise Harman, DO;  [facility];  Service: Endoscopy;;   RADICAL NECK DISSECTION Left 10/23/2020    Procedure: LEFT NECK DISSECTION;  Surgeon: Izora Gala, MD;  Location: [facility];  Service: ENT;  Laterality: Left;   SHOULDER ARTHROSCOPY Right 02/17/2000   SKIN CANCER EXCISION     Multiple basal cell and squmous cell leisons removed   TOTAL SHOULDER ARTHROPLASTY Left 09/29/2018   Procedure: TOTAL SHOULDER ARTHROPLASTY;  Surgeon: Nicholes Stairs, MD;  Location: [facility];  Service: Orthopedics;  Laterality: Left;  2.5 hrs    There were no vitals filed for this visit.   Subjective Assessment - 12/05/20 0853     Subjective I am still eating and my throat is not hurting yet. I have been wearing the chip pack for several hours a day.    Pertinent History Metastatic SCC to lymph node and positive Base of Tongue SCC Stage 1 (T1, N1, M0, p 16 +), 08/30/20 Biopsy of left submandibular lymph node revealed poorly differentiated carcinoma, 7/25 PET revealing hypermetabolic left-sided level II cervical lymphadenopathy. PET also demonstrated relatively symmetric hypermetabolic FDG uptake identified in the anterior hypopharynx, and no discrete oro pharyngeal hypermetabolic lesion evident. Otherwise, no evidence of hypermetabolic metastatic disease was visualized in the chest, abdomen, or pelvis, 09/11/20 CT neck revealing two pathologic left level 2 lymph nodes as seen on prior PET-CT. No primary lesion or contralateral metastatic node was visualized, 10/23/20 patient underwent direct laryngoscopy and  neck dissection by Dr. Constance Holster with biopsies taken from the L BOT and L Tonsil. L BOT revealed invasive moderate to poorly differentiated SCC and the L Tonsil was negative, will receive 35 fractions of radiation to his BOT and bilateral neck. He had CT simulation today and will start 10/13, L shoulder replacement 10 yrs ago, pt reports he needs both knees replaced as well, diabetes    Patient Stated Goals to gain info from providers    Currently in Pain? No/denies    Multiple Pain Sites No                                [facility] Adult PT Treatment/Exercise - 12/05/20 0001       Manual Therapy   Manual Lymphatic Drainage (MLD) with pt seated in chair - therapist described each step of the sequence and pt return demonstrated entire sequence and correct skin stretch. His wife was also present for instruction as follows: short neck, 5 diaphragmatic breaths, bilateral axillary nodes, bilateral pectoral nodes, bilateral chest, short neck, posterior, lateral and anterior neck then retracing all steps while instructing pt and spouse in anatomy and physiology of the lymphatic system and proper sequence and skin stretch technique                          PT Long Term Goals - 11/21/20 1022       PT LONG TERM GOAL #1   Title Pt will be independent in self MLD for long term management of lymphedema.    Time 8    Period Weeks    Status New    Target Date 01/16/21      PT LONG TERM GOAL #2   Title Pt will demonstrate full cervical ROM in all directions to allow pt to return to PLOF.    Baseline R lateral flexion and extension are limited    Time 8    Period Weeks    Status New    Target Date 01/16/21      PT LONG TERM GOAL #3   Title Pt will receive trial of FlexiTouch compression pump for long term management of lymphedema.    Time 8    Period Weeks    Status New    Target Date 01/16/21      PT LONG TERM GOAL #4   Title Pt will obtain appropriate compression garment for long term management of head and neck lymphedema.    Time 8    Period Weeks    Status New    Target Date 01/16/21                   Plan - 12/05/20 0850     Clinical Impression Statement Instructed pt and spouse in correct self MLD techinque and had pt return demonstrate entire sequence. Pt required minimal cueing for correct skin stretch and demonstrated good speed. Pt and spouse verbalized a good understanding of the correct sequence and technique. Educated pt to spend at  least 15-20 min on self MLD prior to wearing his chip pack and to wear his chip pack at least 4 hours a day. Pt is currently in the middle of radiation and has completed 6 of 35 treatments.    PT Frequency 1x / week    PT Duration 8 weeks    PT Treatment/Interventions ADLs/Self Care Home Management;Therapeutic exercise;Patient/family education;Manual techniques;Manual  lymph drainage;Compression bandaging;Scar mobilization;Passive range of motion;Taping;Vasopneumatic Device    PT Next Visit Plan how was self MLD? cont  MLD and instruct pt and spouse and issue handout, did flexi send benefit info, neck ROM    PT Home Exercise Plan head and neck ROM exercises, wear chip pack, self MLD    Consulted and Agree with Plan of Care Patient             Patient will benefit from skilled therapeutic intervention in order to improve the following deficits and impairments:  Postural dysfunction, Impaired flexibility, Increased fascial restricitons, Decreased knowledge of precautions, Decreased range of motion, Decreased scar mobility, Increased edema  Visit Diagnosis: Lymphedema, not elsewhere classified  Aftercare following surgery for neoplasm  Abnormal posture  Malignant neoplasm of base of tongue ([place])     Problem List Patient Active Problem List   Diagnosis Date Noted   Malignant neoplasm of base of tongue ([place]) 11/01/2020   Metastatic cancer to cervical lymph nodes ([place]) 10/23/2020   Metastatic squamous cell carcinoma to lymph node ([place]) 09/13/2020   Osteoarthritis, knee 08/29/2019   OSA (obstructive sleep apnea) 02/26/2019   Osteoarthritis of left shoulder 09/29/2018   History of left shoulder replacement 09/29/2018   Diabetes mellitus, type 2 ([place]) 07/29/2015   Essential hypertension 04/29/2015   Hyperlipidemia 04/29/2015   Morbid obesity ([place]) 04/29/2015    Teige Rountree Breedlove [place], PT 12/05/2020, 8:54 AM  [facility] @ [place]  [place], [place], [address] Phone: [phone]   Fax:  [phone]  Name: Gregory Carey MRN: [id number] Date of Birth: 1960-08-03   Manus Gunning, PT 12/05/20 8:54 AM

## 2020-12-06 ENCOUNTER — Ambulatory Visit
Admission: RE | Admit: 2020-12-06 | Discharge: 2020-12-06 | Disposition: A | Discharge status: Home / Self Care | Payer: BC Managed Care – PPO | Source: Ambulatory Visit | Attending: Radiation Oncology | Admitting: Radiation Oncology

## 2020-12-06 DIAGNOSIS — R1310 Dysphagia, unspecified: Secondary | ICD-10-CM | POA: Diagnosis not present

## 2020-12-06 DIAGNOSIS — C770 Secondary and unspecified malignant neoplasm of lymph nodes of head, face and neck: Secondary | ICD-10-CM | POA: Diagnosis not present

## 2020-12-06 DIAGNOSIS — I890 Lymphedema, not elsewhere classified: Secondary | ICD-10-CM | POA: Diagnosis not present

## 2020-12-06 DIAGNOSIS — R293 Abnormal posture: Secondary | ICD-10-CM | POA: Diagnosis not present

## 2020-12-06 DIAGNOSIS — C01 Malignant neoplasm of base of tongue: Secondary | ICD-10-CM | POA: Diagnosis not present

## 2020-12-06 DIAGNOSIS — Z483 Aftercare following surgery for neoplasm: Secondary | ICD-10-CM | POA: Diagnosis not present

## 2020-12-09 ENCOUNTER — Other Ambulatory Visit: Payer: Self-pay | Admitting: Radiation Oncology

## 2020-12-09 ENCOUNTER — Other Ambulatory Visit: Payer: Self-pay

## 2020-12-09 ENCOUNTER — Ambulatory Visit
Admission: RE | Admit: 2020-12-09 | Discharge: 2020-12-09 | Disposition: A | Discharge status: Home / Self Care | Payer: BC Managed Care – PPO | Source: Ambulatory Visit | Attending: Radiation Oncology | Admitting: Radiation Oncology

## 2020-12-09 DIAGNOSIS — C01 Malignant neoplasm of base of tongue: Secondary | ICD-10-CM

## 2020-12-09 DIAGNOSIS — C770 Secondary and unspecified malignant neoplasm of lymph nodes of head, face and neck: Secondary | ICD-10-CM | POA: Diagnosis not present

## 2020-12-09 DIAGNOSIS — I890 Lymphedema, not elsewhere classified: Secondary | ICD-10-CM | POA: Diagnosis not present

## 2020-12-09 DIAGNOSIS — R1310 Dysphagia, unspecified: Secondary | ICD-10-CM | POA: Diagnosis not present

## 2020-12-09 DIAGNOSIS — Z483 Aftercare following surgery for neoplasm: Secondary | ICD-10-CM | POA: Diagnosis not present

## 2020-12-09 DIAGNOSIS — R293 Abnormal posture: Secondary | ICD-10-CM | POA: Diagnosis not present

## 2020-12-09 MED ORDER — LIDOCAINE VISCOUS HCL 2 % MT SOLN: OROMUCOSAL | 3 refills | Status: DC

## 2020-12-10 ENCOUNTER — Ambulatory Visit
Admission: RE | Admit: 2020-12-10 | Discharge: 2020-12-10 | Disposition: A | Discharge status: Home / Self Care | Payer: BC Managed Care – PPO | Source: Ambulatory Visit | Attending: Radiation Oncology | Admitting: Radiation Oncology

## 2020-12-10 ENCOUNTER — Ambulatory Visit: Payer: BC Managed Care – PPO | Admitting: Physical Therapy

## 2020-12-10 DIAGNOSIS — I890 Lymphedema, not elsewhere classified: Secondary | ICD-10-CM | POA: Diagnosis not present

## 2020-12-10 DIAGNOSIS — C01 Malignant neoplasm of base of tongue: Secondary | ICD-10-CM

## 2020-12-10 DIAGNOSIS — R293 Abnormal posture: Secondary | ICD-10-CM

## 2020-12-10 DIAGNOSIS — Z483 Aftercare following surgery for neoplasm: Secondary | ICD-10-CM

## 2020-12-10 DIAGNOSIS — R1310 Dysphagia, unspecified: Secondary | ICD-10-CM | POA: Diagnosis not present

## 2020-12-10 DIAGNOSIS — C770 Secondary and unspecified malignant neoplasm of lymph nodes of head, face and neck: Secondary | ICD-10-CM | POA: Diagnosis not present

## 2020-12-10 NOTE — Therapy (Signed)
[facility] @ [place], [place], [address] Phone: [phone]   Fax:  [phone]  Physical Therapy Treatment  Patient Details  Name: Gregory Carey MRN: [id number] Date of Birth: 1960-11-04 Referring Provider (PT): Reita May Date: 12/10/2020   PT End of Session - 12/10/20 0953     Visit Number 4    Number of Visits 9    Date for PT Re-Evaluation 01/16/21    PT Start Time 0904    PT Stop Time 0949    PT Time Calculation (min) 45 min    Activity Tolerance Patient tolerated treatment well    Behavior During Therapy [facility] for tasks assessed/performed             Past Medical History:  Diagnosis Date   Cancer ([place])    neck   Complication of anesthesia    Diabetes mellitus without complication ([place])    Difficult intubation    Was infomed by MD Anesth his airway was small   Hyperlipidemia    Hypertension    Localized osteoarthritis of left shoulder    Osteoarthritis of left shoulder 09/29/2018   Sleep apnea    uses CPAP    Past Surgical History:  Procedure Laterality Date   BIOPSY  04/23/2020   Procedure: BIOPSY;  Surgeon: Eloise Harman, DO;  [facility];  Service: Endoscopy;;   COLONOSCOPY WITH PROPOFOL N/A 04/23/2020   Procedure: COLONOSCOPY WITH PROPOFOL;  Surgeon: Eloise Harman, DO;  [facility];  Service: Endoscopy;  Laterality: N/A;  am appt   DIRECT LARYNGOSCOPY N/A 10/23/2020   Procedure: DIRECT LARYNGOSCOPY WITH BIOPSIES;  Surgeon: Izora Gala, MD;  Location: [place];  Service: ENT;  Laterality: N/A;   ESOPHAGOSCOPY N/A 10/23/2020   Procedure: ESOPHAGOSCOPY;  Surgeon: Izora Gala, MD;  Location: [place];  Service: ENT;  Laterality: N/A;   KNEE ARTHROSCOPY Right 02/16/2006   POLYPECTOMY  04/23/2020   Procedure: POLYPECTOMY;  Surgeon: Eloise Harman, DO;  [facility];  Service: Endoscopy;;   RADICAL NECK DISSECTION Left 10/23/2020    Procedure: LEFT NECK DISSECTION;  Surgeon: Izora Gala, MD;  Location: [place];  Service: ENT;  Laterality: Left;   SHOULDER ARTHROSCOPY Right 02/17/2000   SKIN CANCER EXCISION     Multiple basal cell and squmous cell leisons removed   TOTAL SHOULDER ARTHROPLASTY Left 09/29/2018   Procedure: TOTAL SHOULDER ARTHROPLASTY;  Surgeon: Nicholes Stairs, MD;  Location: [place];  Service: Orthopedics;  Laterality: Left;  2.5 hrs    There were no vitals filed for this visit.   Subjective Assessment - 12/10/20 0956     Subjective I have been doing the self MLD. Sometimes I get the sequence a little mixed up.    Pertinent History Metastatic SCC to lymph node and positive Base of Tongue SCC Stage 1 (T1, N1, M0, p 16 +), 08/30/20 Biopsy of left submandibular lymph node revealed poorly differentiated carcinoma, 7/25 PET revealing hypermetabolic left-sided level II cervical lymphadenopathy. PET also demonstrated relatively symmetric hypermetabolic FDG uptake identified in the anterior hypopharynx, and no discrete oro pharyngeal hypermetabolic lesion evident. Otherwise, no evidence of hypermetabolic metastatic disease was visualized in the chest, abdomen, or pelvis, 09/11/20 CT neck revealing two pathologic left level 2 lymph nodes as seen on prior PET-CT. No primary lesion or contralateral metastatic node was visualized, 10/23/20 patient underwent direct laryngoscopy and neck dissection by Dr. Constance Holster with biopsies  taken from the L BOT and L Tonsil. L BOT revealed invasive moderate to poorly differentiated SCC and the L Tonsil was negative, will receive 35 fractions of radiation to his BOT and bilateral neck. He had CT simulation today and will start 10/13, L shoulder replacement 10 yrs ago, pt reports he needs both knees replaced as well, diabetes    Patient Stated Goals to gain info from providers    Currently in Pain? No/denies                               [facility] Adult PT Treatment/Exercise -  12/10/20 0001       Manual Therapy   Manual Lymphatic Drainage (MLD) in supine: short neck, 5 diaphragmatic breaths, bilateral axillary nodes, bilateral pectoral nodes, bilateral chest, short neck, posterior, lateral and anterior neck then retracing all steps                          PT Long Term Goals - 11/21/20 1022       PT LONG TERM GOAL #1   Title Pt will be independent in self MLD for long term management of lymphedema.    Time 8    Period Weeks    Status New    Target Date 01/16/21      PT LONG TERM GOAL #2   Title Pt will demonstrate full cervical ROM in all directions to allow pt to return to PLOF.    Baseline R lateral flexion and extension are limited    Time 8    Period Weeks    Status New    Target Date 01/16/21      PT LONG TERM GOAL #3   Title Pt will receive trial of FlexiTouch compression pump for long term management of lymphedema.    Time 8    Period Weeks    Status New    Target Date 01/16/21      PT LONG TERM GOAL #4   Title Pt will obtain appropriate compression garment for long term management of head and neck lymphedema.    Time 8    Period Weeks    Status New    Target Date 01/16/21                   Plan - 12/10/20 0954     Clinical Impression Statement Issued [place] anterior approach head and neck MLD handout today. Pt has been attempting self MLD at home and is swelling is notably improved with increased skin wrinkles noted. After MLD today to anterior neck there ws further softening of tissue and improved  mobility. Pt was educated that the best time to wear his compression garment is after MLD.    PT Frequency 1x / week    PT Duration 8 weeks    PT Treatment/Interventions ADLs/Self Care Home Management;Therapeutic exercise;Patient/family education;Manual techniques;Manual lymph drainage;Compression bandaging;Scar mobilization;Passive range of motion;Taping;Vasopneumatic Device    PT Next Visit Plan how was self  MLD? cont  MLD and instruct pt and spouse and issue handout, did flexi send benefit info, neck ROM    PT Home Exercise Plan head and neck ROM exercises, wear chip pack, self MLD    Consulted and Agree with Plan of Care Patient             Patient will benefit from skilled therapeutic intervention in order to improve the  following deficits and impairments:  Postural dysfunction, Impaired flexibility, Increased fascial restricitons, Decreased knowledge of precautions, Decreased range of motion, Decreased scar mobility, Increased edema  Visit Diagnosis: Lymphedema, not elsewhere classified  Aftercare following surgery for neoplasm  Abnormal posture  Malignant neoplasm of base of tongue ([place])     Problem List Patient Active Problem List   Diagnosis Date Noted   Malignant neoplasm of base of tongue ([place]) 11/01/2020   Metastatic cancer to cervical lymph nodes ([place]) 10/23/2020   Metastatic squamous cell carcinoma to lymph node ([place]) 09/13/2020   Osteoarthritis, knee 08/29/2019   OSA (obstructive sleep apnea) 02/26/2019   Osteoarthritis of left shoulder 09/29/2018   History of left shoulder replacement 09/29/2018   Diabetes mellitus, type 2 ([place]) 07/29/2015   Essential hypertension 04/29/2015   Hyperlipidemia 04/29/2015   Morbid obesity ([place]) 04/29/2015    Saoirse Legere Breedlove [place], PT 12/10/2020, 9:57 AM  [facility] @ [place], [place], [address] Phone: [phone]   Fax:  [phone]  Name: Gregory Carey MRN: [id number] Date of Birth: February 15, 1961   Manus Gunning, PT 12/10/20 9:57 AM

## 2020-12-11 ENCOUNTER — Ambulatory Visit
Admission: RE | Admit: 2020-12-11 | Discharge: 2020-12-11 | Disposition: A | Discharge status: Home / Self Care | Payer: BC Managed Care – PPO | Source: Ambulatory Visit | Attending: Radiation Oncology | Admitting: Radiation Oncology

## 2020-12-11 ENCOUNTER — Other Ambulatory Visit: Payer: Self-pay

## 2020-12-11 DIAGNOSIS — Z483 Aftercare following surgery for neoplasm: Secondary | ICD-10-CM | POA: Diagnosis not present

## 2020-12-11 DIAGNOSIS — C770 Secondary and unspecified malignant neoplasm of lymph nodes of head, face and neck: Secondary | ICD-10-CM | POA: Diagnosis not present

## 2020-12-11 DIAGNOSIS — R293 Abnormal posture: Secondary | ICD-10-CM | POA: Diagnosis not present

## 2020-12-11 DIAGNOSIS — R1310 Dysphagia, unspecified: Secondary | ICD-10-CM | POA: Diagnosis not present

## 2020-12-11 DIAGNOSIS — I890 Lymphedema, not elsewhere classified: Secondary | ICD-10-CM | POA: Diagnosis not present

## 2020-12-11 DIAGNOSIS — C01 Malignant neoplasm of base of tongue: Secondary | ICD-10-CM | POA: Diagnosis not present

## 2020-12-12 ENCOUNTER — Ambulatory Visit
Admission: RE | Admit: 2020-12-12 | Discharge: 2020-12-12 | Disposition: A | Discharge status: Home / Self Care | Payer: BC Managed Care – PPO | Source: Ambulatory Visit | Attending: Radiation Oncology | Admitting: Radiation Oncology

## 2020-12-12 DIAGNOSIS — R1310 Dysphagia, unspecified: Secondary | ICD-10-CM | POA: Diagnosis not present

## 2020-12-12 DIAGNOSIS — C770 Secondary and unspecified malignant neoplasm of lymph nodes of head, face and neck: Secondary | ICD-10-CM | POA: Diagnosis not present

## 2020-12-12 DIAGNOSIS — R293 Abnormal posture: Secondary | ICD-10-CM | POA: Diagnosis not present

## 2020-12-12 DIAGNOSIS — Z483 Aftercare following surgery for neoplasm: Secondary | ICD-10-CM | POA: Diagnosis not present

## 2020-12-12 DIAGNOSIS — I890 Lymphedema, not elsewhere classified: Secondary | ICD-10-CM | POA: Diagnosis not present

## 2020-12-12 DIAGNOSIS — C01 Malignant neoplasm of base of tongue: Secondary | ICD-10-CM | POA: Diagnosis not present

## 2020-12-13 ENCOUNTER — Ambulatory Visit
Admission: RE | Admit: 2020-12-13 | Discharge: 2020-12-13 | Disposition: A | Discharge status: Home / Self Care | Payer: BC Managed Care – PPO | Source: Ambulatory Visit | Attending: Radiation Oncology | Admitting: Radiation Oncology

## 2020-12-13 ENCOUNTER — Other Ambulatory Visit: Payer: Self-pay

## 2020-12-13 ENCOUNTER — Inpatient Hospital Stay: Payer: BC Managed Care – PPO | Admitting: Nutrition

## 2020-12-13 DIAGNOSIS — C770 Secondary and unspecified malignant neoplasm of lymph nodes of head, face and neck: Secondary | ICD-10-CM | POA: Diagnosis not present

## 2020-12-13 DIAGNOSIS — Z483 Aftercare following surgery for neoplasm: Secondary | ICD-10-CM | POA: Diagnosis not present

## 2020-12-13 DIAGNOSIS — R293 Abnormal posture: Secondary | ICD-10-CM | POA: Diagnosis not present

## 2020-12-13 DIAGNOSIS — C01 Malignant neoplasm of base of tongue: Secondary | ICD-10-CM | POA: Diagnosis not present

## 2020-12-13 DIAGNOSIS — R1310 Dysphagia, unspecified: Secondary | ICD-10-CM | POA: Diagnosis not present

## 2020-12-13 DIAGNOSIS — I890 Lymphedema, not elsewhere classified: Secondary | ICD-10-CM | POA: Diagnosis not present

## 2020-12-13 NOTE — Progress Notes (Signed)
Nutrition follow-up completed with patient and wife after radiation therapy for base of tongue cancer. Weight is stable at 267 pounds documented October 24.  Patient weighed 269.6 pounds October 17 and 269 pounds on October 5. There are no new labs. Patient reports he is starting to experience some nutrition impact symptoms.  Reports his throat is beginning to get sore and he is noticing taste alterations. Patient denies diarrhea or constipation.  He also denies nausea and vomiting. He continues to eat much of the same foods he did before despite taste and throat soreness.   He is drinking more water. He has not tried oral nutrition supplements yet.  Nutrition diagnosis: Predicted suboptimal energy intake continues.  Intervention: Educated on strategies for softer more moist foods and liquids. Reviewed strategies for helping with taste alterations. Encourage patient to discuss pain medications with MD as needed. Encouraged patient to continue lidocaine. Provided additional handout on thick saliva as well as recipes. Patient and wife have contact information.  Monitoring, evaluation, goals: Patient will work to maintain adequate calories and protein for weight maintenance.  Next visit: Wednesday, November 2 with Vinnie Level.  **Disclaimer: This note was dictated with voice recognition software. Similar sounding words can inadvertently be transcribed and this note may contain transcription errors which may not have been corrected upon publication of note.**

## 2020-12-16 ENCOUNTER — Ambulatory Visit
Admission: RE | Admit: 2020-12-16 | Discharge: 2020-12-16 | Disposition: A | Discharge status: Home / Self Care | Payer: BC Managed Care – PPO | Source: Ambulatory Visit | Attending: Radiation Oncology | Admitting: Radiation Oncology

## 2020-12-16 ENCOUNTER — Other Ambulatory Visit: Payer: Self-pay

## 2020-12-16 DIAGNOSIS — Z483 Aftercare following surgery for neoplasm: Secondary | ICD-10-CM | POA: Diagnosis not present

## 2020-12-16 DIAGNOSIS — R1310 Dysphagia, unspecified: Secondary | ICD-10-CM | POA: Diagnosis not present

## 2020-12-16 DIAGNOSIS — I890 Lymphedema, not elsewhere classified: Secondary | ICD-10-CM | POA: Diagnosis not present

## 2020-12-16 DIAGNOSIS — R293 Abnormal posture: Secondary | ICD-10-CM | POA: Diagnosis not present

## 2020-12-16 DIAGNOSIS — C01 Malignant neoplasm of base of tongue: Secondary | ICD-10-CM | POA: Diagnosis not present

## 2020-12-16 DIAGNOSIS — C770 Secondary and unspecified malignant neoplasm of lymph nodes of head, face and neck: Secondary | ICD-10-CM | POA: Diagnosis not present

## 2020-12-17 ENCOUNTER — Ambulatory Visit
Admission: RE | Admit: 2020-12-17 | Discharge: 2020-12-17 | Disposition: A | Discharge status: Home / Self Care | Payer: BC Managed Care – PPO | Source: Ambulatory Visit | Attending: Radiation Oncology | Admitting: Radiation Oncology

## 2020-12-17 ENCOUNTER — Encounter: Payer: Self-pay | Admitting: Physical Therapy

## 2020-12-17 ENCOUNTER — Other Ambulatory Visit: Payer: Self-pay | Admitting: Family

## 2020-12-17 ENCOUNTER — Ambulatory Visit: Payer: BC Managed Care – PPO | Attending: Radiation Oncology | Admitting: Physical Therapy

## 2020-12-17 DIAGNOSIS — R293 Abnormal posture: Secondary | ICD-10-CM | POA: Diagnosis not present

## 2020-12-17 DIAGNOSIS — C01 Malignant neoplasm of base of tongue: Secondary | ICD-10-CM | POA: Insufficient documentation

## 2020-12-17 DIAGNOSIS — Z794 Long term (current) use of insulin: Secondary | ICD-10-CM

## 2020-12-17 DIAGNOSIS — R1310 Dysphagia, unspecified: Secondary | ICD-10-CM | POA: Insufficient documentation

## 2020-12-17 DIAGNOSIS — I890 Lymphedema, not elsewhere classified: Secondary | ICD-10-CM | POA: Diagnosis not present

## 2020-12-17 DIAGNOSIS — E1165 Type 2 diabetes mellitus with hyperglycemia: Secondary | ICD-10-CM

## 2020-12-17 DIAGNOSIS — C770 Secondary and unspecified malignant neoplasm of lymph nodes of head, face and neck: Secondary | ICD-10-CM | POA: Insufficient documentation

## 2020-12-17 DIAGNOSIS — Z483 Aftercare following surgery for neoplasm: Secondary | ICD-10-CM | POA: Insufficient documentation

## 2020-12-17 NOTE — Therapy (Signed)
[facility] @ [place], [place], [address] Phone: [phone]   Fax:  [phone]  Physical Therapy Treatment  Patient Details  Name: Gregory Carey MRN: [id number] Date of Birth: [birth year] Referring Provider (PT): Isidore Moos   Encounter Date: 12/17/2020   PT End of Session - 12/17/20 0940     Visit Number 5    Number of Visits 9    Date for PT Re-Evaluation 01/16/21    PT Start Time 0834    PT Stop Time 0925    PT Time Calculation (min) 51 min    Activity Tolerance Patient tolerated treatment well    Behavior During Therapy [facility] for tasks assessed/performed             Past Medical History:  Diagnosis Date   Cancer ([place])    neck   Complication of anesthesia    Diabetes mellitus without complication ([place])    Difficult intubation    Was infomed by MD Anesth his airway was small   Hyperlipidemia    Hypertension    Localized osteoarthritis of left shoulder    Osteoarthritis of left shoulder 09/29/2018   Sleep apnea    uses CPAP    Past Surgical History:  Procedure Laterality Date   BIOPSY  04/23/2020   Procedure: BIOPSY;  Surgeon: Eloise Harman, DO;  [facility];  Service: Endoscopy;;   COLONOSCOPY WITH PROPOFOL N/A 04/23/2020   Procedure: COLONOSCOPY WITH PROPOFOL;  Surgeon: Eloise Harman, DO;  [facility];  Service: Endoscopy;  Laterality: N/A;  am appt   DIRECT LARYNGOSCOPY N/A 10/23/2020   Procedure: DIRECT LARYNGOSCOPY WITH BIOPSIES;  Surgeon: Izora Gala, MD;  Location: [place] [facility];  Service: ENT;  Laterality: N/A;   ESOPHAGOSCOPY N/A 10/23/2020   Procedure: ESOPHAGOSCOPY;  Surgeon: Izora Gala, MD;  Location: [place];  Service: ENT;  Laterality: N/A;   KNEE ARTHROSCOPY Right 02/16/2006   POLYPECTOMY  04/23/2020   Procedure: POLYPECTOMY;  Surgeon: Eloise Harman, DO;  [facility];  Service: Endoscopy;;   RADICAL NECK DISSECTION Left 10/23/2020   Procedure:  LEFT NECK DISSECTION;  Surgeon: Izora Gala, MD;  Location: [place];  Service: ENT;  Laterality: Left;   SHOULDER ARTHROSCOPY Right 02/17/2000   SKIN CANCER EXCISION     Multiple basal cell and squmous cell leisons removed   TOTAL SHOULDER ARTHROPLASTY Left 09/29/2018   Procedure: TOTAL SHOULDER ARTHROPLASTY;  Surgeon: Nicholes Stairs, MD;  Location: [place];  Service: Orthopedics;  Laterality: Left;  2.5 hrs    There were no vitals filed for this visit.   Subjective Assessment - 12/17/20 0837     Subjective It feels like I have a rock in my throat but I am still eating. I am starting to feel fatigue now from the radiation.    Pertinent History Metastatic SCC to lymph node and positive Base of Tongue SCC Stage 1 (T1, N1, M0, p 16 +), 08/30/20 Biopsy of left submandibular lymph node revealed poorly differentiated carcinoma, 7/25 PET revealing hypermetabolic left-sided level II cervical lymphadenopathy. PET also demonstrated relatively symmetric hypermetabolic FDG uptake identified in the anterior hypopharynx, and no discrete oro pharyngeal hypermetabolic lesion evident. Otherwise, no evidence of hypermetabolic metastatic disease was visualized in the chest, abdomen, or pelvis, 09/11/20 CT neck revealing two pathologic left level 2 lymph nodes as seen on prior PET-CT. No primary lesion or contralateral metastatic node was visualized, 10/23/20 patient underwent direct laryngoscopy  and neck dissection by Dr. Constance Holster with biopsies taken from the L BOT and L Tonsil. L BOT revealed invasive moderate to poorly differentiated SCC and the L Tonsil was negative, will receive 35 fractions of radiation to his BOT and bilateral neck. He had CT simulation today and will start 10/13, L shoulder replacement 10 yrs ago, pt reports he needs both knees replaced as well, diabetes    Patient Stated Goals to gain info from providers    Currently in Pain? No/denies    Multiple Pain Sites No                                [facility] Adult PT Treatment/Exercise - 12/17/20 0001       Manual Therapy   Manual Lymphatic Drainage (MLD) in supine: short neck, 5 diaphragmatic breaths, bilateral axillary nodes, bilateral pectoral nodes, bilateral chest, short neck, posterior, lateral and anterior neck then retracing all steps (focused mainly on anterior neck where skin was not compromised)                          PT Long Term Goals - 11/21/20 1022       PT LONG TERM GOAL #1   Title Pt will be independent in self MLD for long term management of lymphedema.    Time 8    Period Weeks    Status New    Target Date 01/16/21      PT LONG TERM GOAL #2   Title Pt will demonstrate full cervical ROM in all directions to allow pt to return to PLOF.    Baseline R lateral flexion and extension are limited    Time 8    Period Weeks    Status New    Target Date 01/16/21      PT LONG TERM GOAL #3   Title Pt will receive trial of FlexiTouch compression pump for long term management of lymphedema.    Time 8    Period Weeks    Status New    Target Date 01/16/21      PT LONG TERM GOAL #4   Title Pt will obtain appropriate compression garment for long term management of head and neck lymphedema.    Time 8    Period Weeks    Status New    Target Date 01/16/21                   Plan - 12/17/20 0948     Clinical Impression Statement Pt's skin is starting to become more red from radiation and the sides of his neck are very dry and starting to peel. No peeling at anterior neck so focused on this area today. Pt reports no increased tenderness with MLD today in this area. Educated pt that if anterior neck worsens or starts peeling that we may have to place PT on hold until after completion of radiation.    PT Frequency 1x / week    PT Duration 8 weeks    PT Treatment/Interventions ADLs/Self Care Home Management;Therapeutic exercise;Patient/family education;Manual  techniques;Manual lymph drainage;Compression bandaging;Scar mobilization;Passive range of motion;Taping;Vasopneumatic Device    PT Next Visit Plan how was self MLD? cont  MLD and instruct pt and spouse and issue handout, did flexi send benefit info, neck ROM    PT Home Exercise Plan head and neck ROM exercises, wear chip pack, self MLD  Consulted and Agree with Plan of Care Patient             Patient will benefit from skilled therapeutic intervention in order to improve the following deficits and impairments:  Postural dysfunction, Impaired flexibility, Increased fascial restricitons, Decreased knowledge of precautions, Decreased range of motion, Decreased scar mobility, Increased edema  Visit Diagnosis: Lymphedema, not elsewhere classified  Aftercare following surgery for neoplasm  Abnormal posture  Malignant neoplasm of base of tongue ([place])     Problem List Patient Active Problem List   Diagnosis Date Noted   Malignant neoplasm of base of tongue ([place]) 11/01/2020   Metastatic cancer to cervical lymph nodes ([place]) 10/23/2020   Metastatic squamous cell carcinoma to lymph node ([place]) 09/13/2020   Osteoarthritis, knee 08/29/2019   OSA (obstructive sleep apnea) 02/26/2019   Osteoarthritis of left shoulder 09/29/2018   History of left shoulder replacement 09/29/2018   Diabetes mellitus, type 2 ([place]) 07/29/2015   Essential hypertension 04/29/2015   Hyperlipidemia 04/29/2015   Morbid obesity ([place]) 04/29/2015    Terrie Grajales Breedlove [place], PT 12/17/2020, 9:55 AM  [facility] @ [place], [place], [address] Phone: [phone]   Fax:  [phone]  Name: SAVIO ALBRECHT MRN: [id number] Date of Birth: 07/04/1960   Manus Gunning, PT 12/17/20 9:56 AM

## 2020-12-18 ENCOUNTER — Inpatient Hospital Stay: Payer: BC Managed Care – PPO | Attending: Hematology and Oncology | Admitting: Dietician

## 2020-12-18 ENCOUNTER — Other Ambulatory Visit: Payer: Self-pay

## 2020-12-18 ENCOUNTER — Ambulatory Visit
Admission: RE | Admit: 2020-12-18 | Discharge: 2020-12-18 | Disposition: A | Discharge status: Home / Self Care | Payer: BC Managed Care – PPO | Source: Ambulatory Visit | Attending: Radiation Oncology | Admitting: Radiation Oncology

## 2020-12-18 DIAGNOSIS — C01 Malignant neoplasm of base of tongue: Secondary | ICD-10-CM | POA: Diagnosis not present

## 2020-12-18 DIAGNOSIS — R293 Abnormal posture: Secondary | ICD-10-CM | POA: Diagnosis not present

## 2020-12-18 DIAGNOSIS — R1310 Dysphagia, unspecified: Secondary | ICD-10-CM | POA: Diagnosis not present

## 2020-12-18 DIAGNOSIS — C770 Secondary and unspecified malignant neoplasm of lymph nodes of head, face and neck: Secondary | ICD-10-CM | POA: Diagnosis not present

## 2020-12-18 DIAGNOSIS — Z483 Aftercare following surgery for neoplasm: Secondary | ICD-10-CM | POA: Diagnosis not present

## 2020-12-18 DIAGNOSIS — I890 Lymphedema, not elsewhere classified: Secondary | ICD-10-CM | POA: Diagnosis not present

## 2020-12-18 NOTE — Progress Notes (Signed)
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   Met with patient and wife in clinic after treatment. Patient reports increased fatigue, he is halfway through treatment but this is the "hard part" His appetite is decreased secondary to diminished taste of foods. Patient reports sweet foods are best tolerated, recalls fruit cocktail with cottage cheese, grapes, butter biscuits with jelly. Wife reports he ate a few bites of cabbage, mac/cheese, and kielbasa last night for supper. Patient reports "it feels like I have a rock in my throat" and having to "clear my throat constantly" Patient denies sore throat or swallowing difficulty. He is using baking soda salt water rinses frequently through out the day. Patient tried Lidocaine mouth rinse but this tasted horrible. He is taking ibuprofen as needed. Patient has not started drinking oral nutrition supplements, he has looked at several stores and unable to find Ensure Complete.    Medications: Metformin, Xylocaine, Trulicity  Labs: No new labs for review  Anthropometrics: Weight 263.6 lb on 10/31 decreased 3.4 lbs (1.3%) in the last week. Patient weighed 267 lb on 10/24 decreased from 269.6 lb on 10/17.    NUTRITION DIAGNOSIS: Predicted suboptimal intake has evolved, patient now with inadequate oral intake related to cancer and associated treatment side effects as evidenced by reported altered taste of foods, decreased appetite, and significant 3.4 lb (1.3%) weight loss in 1 week.     INTERVENTION:  Reinforced importance of adequate calorie and protein energy intake  Reviewed strategies for altered taste Encouraged soft, moist high protein foods Recipes for high calorie, high protein shakes  Suggested patient start drinking oral nutrition supplement, recommend 2 Ensure Enlive/equivalent daily Complimentary Ensure Enlive case provided today Continue baking soda, salt water rinses daily Provided support and  encouragement     MONITORING, EVALUATION, GOAL: weight trends, intake   NEXT VISIT: Tuesday, November 8

## 2020-12-19 ENCOUNTER — Ambulatory Visit: Payer: BC Managed Care – PPO

## 2020-12-19 ENCOUNTER — Ambulatory Visit
Admission: RE | Admit: 2020-12-19 | Discharge: 2020-12-19 | Disposition: A | Discharge status: Home / Self Care | Payer: BC Managed Care – PPO | Source: Ambulatory Visit | Attending: Radiation Oncology | Admitting: Radiation Oncology

## 2020-12-19 DIAGNOSIS — R293 Abnormal posture: Secondary | ICD-10-CM | POA: Diagnosis not present

## 2020-12-19 DIAGNOSIS — C770 Secondary and unspecified malignant neoplasm of lymph nodes of head, face and neck: Secondary | ICD-10-CM | POA: Diagnosis not present

## 2020-12-19 DIAGNOSIS — Z483 Aftercare following surgery for neoplasm: Secondary | ICD-10-CM | POA: Diagnosis not present

## 2020-12-19 DIAGNOSIS — C01 Malignant neoplasm of base of tongue: Secondary | ICD-10-CM | POA: Diagnosis not present

## 2020-12-19 DIAGNOSIS — R1310 Dysphagia, unspecified: Secondary | ICD-10-CM

## 2020-12-19 DIAGNOSIS — I890 Lymphedema, not elsewhere classified: Secondary | ICD-10-CM | POA: Diagnosis not present

## 2020-12-19 NOTE — Therapy (Signed)
[facility] [address], [place], [place], [address] Phone: [phone]   Fax:  [phone]  Speech Language Pathology Treatment  Patient Details  Name: Gregory Carey MRN: [id number] Date of Birth: Dec 15, 1960 Referring Provider (SLP): Eppie Gibson, MD   Encounter Date: 12/19/2020   End of Session - 12/19/20 0855     Visit Number 2    Number of Visits 4    Date for SLP Re-Evaluation 02/19/21    SLP Start Time 0804    SLP Stop Time  0835    SLP Time Calculation (min) 31 min    Activity Tolerance Patient tolerated treatment well             Past Medical History:  Diagnosis Date   Cancer ([place])    neck   Complication of anesthesia    Diabetes mellitus without complication ([place])    Difficult intubation    Was infomed by MD Anesth his airway was small   Hyperlipidemia    Hypertension    Localized osteoarthritis of left shoulder    Osteoarthritis of left shoulder 09/29/2018   Sleep apnea    uses CPAP    Past Surgical History:  Procedure Laterality Date   BIOPSY  04/23/2020   Procedure: BIOPSY;  Surgeon: Eloise Harman, DO;  [facility];  Service: Endoscopy;;   COLONOSCOPY WITH PROPOFOL N/A 04/23/2020   Procedure: COLONOSCOPY WITH PROPOFOL;  Surgeon: Eloise Harman, DO;  [facility];  Service: Endoscopy;  Laterality: N/A;  am appt   DIRECT LARYNGOSCOPY N/A 10/23/2020   Procedure: DIRECT LARYNGOSCOPY WITH BIOPSIES;  Surgeon: Izora Gala, MD;  Location: [facility];  Service: ENT;  Laterality: N/A;   ESOPHAGOSCOPY N/A 10/23/2020   Procedure: ESOPHAGOSCOPY;  Surgeon: Izora Gala, MD;  Location: [facility];  Service: ENT;  Laterality: N/A;   KNEE ARTHROSCOPY Right 02/16/2006   POLYPECTOMY  04/23/2020   Procedure: POLYPECTOMY;  Surgeon: Eloise Harman, DO;  [facility];  Service: Endoscopy;;   RADICAL NECK DISSECTION Left 10/23/2020   Procedure: LEFT NECK DISSECTION;  Surgeon: Izora Gala,  MD;  Location: [facility];  Service: ENT;  Laterality: Left;   SHOULDER ARTHROSCOPY Right 02/17/2000   SKIN CANCER EXCISION     Multiple basal cell and squmous cell leisons removed   TOTAL SHOULDER ARTHROPLASTY Left 09/29/2018   Procedure: TOTAL SHOULDER ARTHROPLASTY;  Surgeon: Nicholes Stairs, MD;  Location: [facility];  Service: Orthopedics;  Laterality: Left;  2.5 hrs    There were no vitals filed for this visit.   Subjective Assessment - 12/19/20 0821     Subjective Pt stated one morning he "got strangled" taking a large sip of water. SLP told pt to have small sips and bites.    Currently in Pain? No/denies                   ADULT SLP TREATMENT - 12/19/20 9924       General Information   Behavior/Cognition Alert;Cooperative;Pleasant mood      Treatment Provided   Treatment provided Dysphagia      Dysphagia Treatment   Temperature Spikes Noted No    Respiratory Status Room air    Oral Cavity - Dentition Adequate natural dentition    Treatment Methods Skilled observation    Patient observed directly with PO's Yes    Type of PO's observed Dysphagia 3 (soft);Thin liquids    Liquids provided via Cup  Oral Phase Signs & Symptoms Prolonged mastication    Pharyngeal Phase Signs & Symptoms Other (comment)   none noted today   Other treatment/comments Pt ate [place] sandwich and drank water without overt s/sx aspiration. Tranell reports completion of HEP piecemeal but getting in all reps during the day. He completed HEP with SBA (tongue protrusion on [place]). SLP reiterated to cycle the HEP when/if swallowing becomes problematic. He told SLP rationale for HEP with SBA (question cue).      Assessment / Recommendations / Plan   Plan Continue with current plan of care      Dysphagia Recommendations   Diet recommendations --   as tolerated   Liquids provided via Cup    Medication Administration --   as tolerated     Progression Toward Goals   Progression toward goals  Progressing toward goals              SLP Education - 12/19/20 0855     Education Details cycle the HEP, may need to gravitate to mushy and puree foods the next 4-6 weeks    Person(s) Educated Patient;Spouse    Methods Explanation    Comprehension Verbalized understanding            SLP SHORT TERM GOAL #1     Title pt will complete HEP with rare min A    Time     Period --   vists, for all STGs    Status Achieved           SLP SHORT TERM GOAL #2    Title pt will tell SLP why pt is completing HEP with modified independence    Time 1    Status Achieved           SLP SHORT TERM GOAL #3    Title pt will describe 3 overt s/s aspiration PNA with modified independence    Time 2    Status Ongoing           SLP SHORT TERM GOAL #4    Title pt will tell SLP how a food journal could hasten return to a more normalized diet    Time 2    Status  Ongoing                    SLP Long Term Goals - 12/19/20 1438                     SLP LONG TERM GOAL #1    Title pt will complete HEP with modified independence over two visits    Time 3    Period --   visits, for all LTGs    Status Ongoing           SLP LONG TERM GOAL #2    Title pt will describe how to modify HEP over time, and the timeline associated with reduction in HEP frequency with modified independence over two sessions    Time 4    Status Ongoing                    Plan - 11/21/20 1440       Clinical Impression Statement At this time pt swallowing is deemed WNL/WFL with soft (dys III) and thin liquids. SLP reviewed pt's individualized HEP for dysphagia and pt completed each exercise on their own independently by session end. There are no overt s/s aspiration reported by pt at this time. Data indicate  that pt's swallow ability will likely decrease over the course of radiation/chemotherapy and could very well decline over time following conclusion of their radiation therapy due to muscle disuse atrophy and/or  muscle fibrosis. Pt will cont to need to be seen by SLP in order to assess safety of PO intake, assess the need for recommending any objective swallow assessment, and ensuring pt correctly completes the individualized HEP.    Speech Therapy Frequency --   once approx every four weeks    Duration --   90 days for this reporting period; overall, approx 7 visits    Treatment/Interventions Aspiration precaution training;Pharyngeal strengthening exercises;Diet toleration management by SLP;Trials of upgraded texture/liquids;Patient/family education;SLP instruction and feedback;Compensatory techniques    Potential to Achieve Goals Good    SLP Home Exercise Plan provided    Consulted and Agree with Plan of Care Patient            Patient will benefit from skilled therapeutic intervention in order to improve the following deficits and impairments:   Dysphagia, unspecified type    Problem List Patient Active Problem List   Diagnosis Date Noted   Malignant neoplasm of base of tongue ([place]) 11/01/2020   Metastatic cancer to cervical lymph nodes ([place]) 10/23/2020   Metastatic squamous cell carcinoma to lymph node ([place]) 09/13/2020   Osteoarthritis, knee 08/29/2019   OSA (obstructive sleep apnea) 02/26/2019   Osteoarthritis of left shoulder 09/29/2018   History of left shoulder replacement 09/29/2018   Diabetes mellitus, type 2 ([place]) 07/29/2015   Essential hypertension 04/29/2015   Hyperlipidemia 04/29/2015   Morbid obesity ([place]) 04/29/2015    [place] ,[place], [place]  12/19/2020, 8:56 AM  [facility] [address] [place], [place], [address] Phone: [phone]   Fax:  [phone]   Name: LUIS NICKLES MRN: [id number] Date of Birth: August 13, 1960

## 2020-12-20 ENCOUNTER — Other Ambulatory Visit: Payer: Self-pay

## 2020-12-20 ENCOUNTER — Ambulatory Visit
Admission: RE | Admit: 2020-12-20 | Discharge: 2020-12-20 | Disposition: A | Discharge status: Home / Self Care | Payer: BC Managed Care – PPO | Source: Ambulatory Visit | Attending: Radiation Oncology | Admitting: Radiation Oncology

## 2020-12-20 DIAGNOSIS — R1310 Dysphagia, unspecified: Secondary | ICD-10-CM | POA: Diagnosis not present

## 2020-12-20 DIAGNOSIS — R293 Abnormal posture: Secondary | ICD-10-CM | POA: Diagnosis not present

## 2020-12-20 DIAGNOSIS — I890 Lymphedema, not elsewhere classified: Secondary | ICD-10-CM | POA: Diagnosis not present

## 2020-12-20 DIAGNOSIS — C770 Secondary and unspecified malignant neoplasm of lymph nodes of head, face and neck: Secondary | ICD-10-CM | POA: Diagnosis not present

## 2020-12-20 DIAGNOSIS — Z483 Aftercare following surgery for neoplasm: Secondary | ICD-10-CM | POA: Diagnosis not present

## 2020-12-20 DIAGNOSIS — C01 Malignant neoplasm of base of tongue: Secondary | ICD-10-CM | POA: Diagnosis not present

## 2020-12-23 ENCOUNTER — Ambulatory Visit
Admission: RE | Admit: 2020-12-23 | Discharge: 2020-12-23 | Disposition: A | Discharge status: Home / Self Care | Payer: BC Managed Care – PPO | Source: Ambulatory Visit | Attending: Radiation Oncology | Admitting: Radiation Oncology

## 2020-12-23 ENCOUNTER — Other Ambulatory Visit: Payer: Self-pay

## 2020-12-23 DIAGNOSIS — C770 Secondary and unspecified malignant neoplasm of lymph nodes of head, face and neck: Secondary | ICD-10-CM | POA: Diagnosis not present

## 2020-12-23 DIAGNOSIS — R293 Abnormal posture: Secondary | ICD-10-CM | POA: Diagnosis not present

## 2020-12-23 DIAGNOSIS — I890 Lymphedema, not elsewhere classified: Secondary | ICD-10-CM | POA: Diagnosis not present

## 2020-12-23 DIAGNOSIS — R1310 Dysphagia, unspecified: Secondary | ICD-10-CM | POA: Diagnosis not present

## 2020-12-23 DIAGNOSIS — Z483 Aftercare following surgery for neoplasm: Secondary | ICD-10-CM | POA: Diagnosis not present

## 2020-12-23 DIAGNOSIS — C01 Malignant neoplasm of base of tongue: Secondary | ICD-10-CM | POA: Diagnosis not present

## 2020-12-24 ENCOUNTER — Inpatient Hospital Stay: Payer: BC Managed Care – PPO | Admitting: Dietician

## 2020-12-24 ENCOUNTER — Ambulatory Visit
Admission: RE | Admit: 2020-12-24 | Discharge: 2020-12-24 | Disposition: A | Discharge status: Home / Self Care | Payer: BC Managed Care – PPO | Source: Ambulatory Visit | Attending: Radiation Oncology | Admitting: Radiation Oncology

## 2020-12-24 DIAGNOSIS — I890 Lymphedema, not elsewhere classified: Secondary | ICD-10-CM | POA: Diagnosis not present

## 2020-12-24 DIAGNOSIS — C01 Malignant neoplasm of base of tongue: Secondary | ICD-10-CM | POA: Diagnosis not present

## 2020-12-24 DIAGNOSIS — R293 Abnormal posture: Secondary | ICD-10-CM | POA: Diagnosis not present

## 2020-12-24 DIAGNOSIS — R1310 Dysphagia, unspecified: Secondary | ICD-10-CM | POA: Diagnosis not present

## 2020-12-24 DIAGNOSIS — C770 Secondary and unspecified malignant neoplasm of lymph nodes of head, face and neck: Secondary | ICD-10-CM | POA: Diagnosis not present

## 2020-12-24 DIAGNOSIS — Z483 Aftercare following surgery for neoplasm: Secondary | ICD-10-CM | POA: Diagnosis not present

## 2020-12-24 NOTE — Progress Notes (Signed)
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   Met with patient and wife after treatment. Patient reports food does not taste good, but continues eating 3 meals plus a bedtime snack. He is unable to eat much for breakfast, drinking Ensure instead. Patient had chicken and dumplings for supper last night. This tasted alright and was able to eat a good portion. He usually eats a sandwich for lunch. Patient is taking small bites with sips of liquids in between. He is having a bowl of ice cream or milkshake for snack. Patient denies nausea, vomiting, diarrhea, reports loose bowel movements related to increased intake of liquids. Patient reports dry mouth, he is using baking soda salt water rinses several times daily. He is drinking ~64 ounces of water.    Medications: reviewed  Labs: reviewed  Anthropometrics: Weight 260.8 lb on 11/7 decreased from 263.6 lb on 10/31 and 267 lb on 10/24.     NUTRITION DIAGNOSIS: Inadequate oral intake continues   INTERVENTION:  Encouraged high calorie, high protein foods  Reviewed strategies for increasing calories and protein Encouraged patient to try shake recipes Continue drinking Ensure En[place]/equivalent, recommend increasing to 3/day Continue lidocaine solution as needed Continue baking soda, salt water rinses Provided recipes for sore mouth and throat Discussed foods with pectin to aid with firming of stool    MONITORING, EVALUATION, GOAL: weight trends, intake   NEXT VISIT: Wednesday November 16

## 2020-12-25 ENCOUNTER — Other Ambulatory Visit: Payer: Self-pay

## 2020-12-25 ENCOUNTER — Ambulatory Visit
Admission: RE | Admit: 2020-12-25 | Discharge: 2020-12-25 | Disposition: A | Discharge status: Home / Self Care | Payer: BC Managed Care – PPO | Source: Ambulatory Visit | Attending: Radiation Oncology | Admitting: Radiation Oncology

## 2020-12-25 DIAGNOSIS — Z483 Aftercare following surgery for neoplasm: Secondary | ICD-10-CM | POA: Diagnosis not present

## 2020-12-25 DIAGNOSIS — I890 Lymphedema, not elsewhere classified: Secondary | ICD-10-CM | POA: Diagnosis not present

## 2020-12-25 DIAGNOSIS — C770 Secondary and unspecified malignant neoplasm of lymph nodes of head, face and neck: Secondary | ICD-10-CM | POA: Diagnosis not present

## 2020-12-25 DIAGNOSIS — R293 Abnormal posture: Secondary | ICD-10-CM | POA: Diagnosis not present

## 2020-12-25 DIAGNOSIS — C01 Malignant neoplasm of base of tongue: Secondary | ICD-10-CM | POA: Diagnosis not present

## 2020-12-25 DIAGNOSIS — R1310 Dysphagia, unspecified: Secondary | ICD-10-CM | POA: Diagnosis not present

## 2020-12-26 ENCOUNTER — Ambulatory Visit: Payer: BC Managed Care – PPO

## 2020-12-26 ENCOUNTER — Ambulatory Visit
Admission: RE | Admit: 2020-12-26 | Discharge: 2020-12-26 | Disposition: A | Discharge status: Home / Self Care | Payer: BC Managed Care – PPO | Source: Ambulatory Visit | Attending: Radiation Oncology | Admitting: Radiation Oncology

## 2020-12-26 DIAGNOSIS — I890 Lymphedema, not elsewhere classified: Secondary | ICD-10-CM | POA: Diagnosis not present

## 2020-12-26 DIAGNOSIS — C770 Secondary and unspecified malignant neoplasm of lymph nodes of head, face and neck: Secondary | ICD-10-CM | POA: Diagnosis not present

## 2020-12-26 DIAGNOSIS — C01 Malignant neoplasm of base of tongue: Secondary | ICD-10-CM

## 2020-12-26 DIAGNOSIS — Z483 Aftercare following surgery for neoplasm: Secondary | ICD-10-CM

## 2020-12-26 DIAGNOSIS — R293 Abnormal posture: Secondary | ICD-10-CM

## 2020-12-26 DIAGNOSIS — R1310 Dysphagia, unspecified: Secondary | ICD-10-CM | POA: Diagnosis not present

## 2020-12-26 NOTE — Therapy (Signed)
[facility] [place], [place], [address] Phone: [phone]   Fax:  [phone]  Physical Therapy Treatment  Patient Details  Name: Gregory Carey MRN: [id number] Date of Birth: 24-Jun-1960 Referring Provider (PT): Isidore Moos   Encounter Date: 12/26/2020   PT End of Session - 12/26/20 1002     Visit Number 6    Number of Visits 9    Date for PT Re-Evaluation 01/16/21    PT Start Time 0905    PT Stop Time 0959    PT Time Calculation (min) 54 min    Activity Tolerance Patient tolerated treatment well    Behavior During Therapy [facility] for tasks assessed/performed             Past Medical History:  Diagnosis Date   Cancer ([place])    neck   Complication of anesthesia    Diabetes mellitus without complication ([place])    Difficult intubation    Was infomed by MD Anesth his airway was small   Hyperlipidemia    Hypertension    Localized osteoarthritis of left shoulder    Osteoarthritis of left shoulder 09/29/2018   Sleep apnea    uses CPAP    Past Surgical History:  Procedure Laterality Date   BIOPSY  04/23/2020   Procedure: BIOPSY;  Surgeon: Eloise Harman, DO;  [facility];  Service: Endoscopy;;   COLONOSCOPY WITH PROPOFOL N/A 04/23/2020   Procedure: COLONOSCOPY WITH PROPOFOL;  Surgeon: Eloise Harman, DO;  [facility];  Service: Endoscopy;  Laterality: N/A;  am appt   DIRECT LARYNGOSCOPY N/A 10/23/2020   Procedure: DIRECT LARYNGOSCOPY WITH BIOPSIES;  Surgeon: Izora Gala, MD;  Location: [facility];  Service: ENT;  Laterality: N/A;   ESOPHAGOSCOPY N/A 10/23/2020   Procedure: ESOPHAGOSCOPY;  Surgeon: Izora Gala, MD;  Location: [facility];  Service: ENT;  Laterality: N/A;   KNEE ARTHROSCOPY Right 02/16/2006   POLYPECTOMY  04/23/2020   Procedure: POLYPECTOMY;  Surgeon: Eloise Harman, DO;  [facility];  Service: Endoscopy;;   RADICAL NECK DISSECTION Left 10/23/2020   Procedure:  LEFT NECK DISSECTION;  Surgeon: Izora Gala, MD;  Location: [facility];  Service: ENT;  Laterality: Left;   SHOULDER ARTHROSCOPY Right 02/17/2000   SKIN CANCER EXCISION     Multiple basal cell and squmous cell leisons removed   TOTAL SHOULDER ARTHROPLASTY Left 09/29/2018   Procedure: TOTAL SHOULDER ARTHROPLASTY;  Surgeon: Nicholes Stairs, MD;  Location: [facility];  Service: Orthopedics;  Laterality: Left;  2.5 hrs    There were no vitals filed for this visit.   Subjective Assessment - 12/26/20 0907     Subjective My throat is starting to feel really tight from the radiation.    Pertinent History Metastatic SCC to lymph node and positive Base of Tongue SCC Stage 1 (T1, N1, M0, p 16 +), 08/30/20 Biopsy of left submandibular lymph node revealed poorly differentiated carcinoma, 7/25 PET revealing hypermetabolic left-sided level II cervical lymphadenopathy. PET also demonstrated relatively symmetric hypermetabolic FDG uptake identified in the anterior hypopharynx, and no discrete oro pharyngeal hypermetabolic lesion evident. Otherwise, no evidence of hypermetabolic metastatic disease was visualized in the chest, abdomen, or pelvis, 09/11/20 CT neck revealing two pathologic left level 2 lymph nodes as seen on prior PET-CT. No primary lesion or contralateral metastatic node was visualized, 10/23/20 patient underwent direct laryngoscopy and neck dissection by Dr. Constance Holster with biopsies taken from the L BOT and  L Tonsil. L BOT revealed invasive moderate to poorly differentiated SCC and the L Tonsil was negative, will receive 35 fractions of radiation to his BOT and bilateral neck. He had CT simulation today and will start 10/13, L shoulder replacement 10 yrs ago, pt reports he needs both knees replaced as well, diabetes    Patient Stated Goals to gain info from providers    Currently in Pain? No/denies                               [facility]/Exercise - 12/26/20 0001        Exercises   Exercises Other Exercises    Other Exercises  Cervical retraction 5x, then cervical retraction with bil UE horz abd 2 x 5 and instructing pt in bil UE er and alt flexion for HEP as well as continuing cervical A/ROM as his skin tolerance allows      Manual Therapy   Manual Therapy Manual Lymphatic Drainage (MLD);Passive ROM    Manual Lymphatic Drainage (MLD) in supine: short neck, superficial and deep abdominals, bilateral axillary nodes, bilateral pectoral nodes, bilateral chest, short neck, posterior, lateral and anterior neck then retracing all steps (focused mainly on anterior neck where skin was not compromised)    Passive ROM In Supine gently into bil cervical rotation and side bending with stabilizing clavicle during for increased stretch. Pt reports feeling skin pulling so stretched very gently and not to full P/ROM due to this                          PT Long Term Goals - 11/21/20 1022       PT LONG TERM GOAL #1   Title Pt will be independent in self MLD for long term management of lymphedema.    Time 8    Period Weeks    Status New    Target Date 01/16/21      PT LONG TERM GOAL #2   Title Pt will demonstrate full cervical ROM in all directions to allow pt to return to PLOF.    Baseline R lateral flexion and extension are limited    Time 8    Period Weeks    Status New    Target Date 01/16/21      PT LONG TERM GOAL #3   Title Pt will receive trial of FlexiTouch compression pump for long term management of lymphedema.    Time 8    Period Weeks    Status New    Target Date 01/16/21      PT LONG TERM GOAL #4   Title Pt will obtain appropriate compression garment for long term management of head and neck lymphedema.    Time 8    Period Weeks    Status New    Target Date 01/16/21                   Plan - 12/26/20 1002     Clinical Impression Statement Continued with manual lymph drainage and included gentle cervical P/ROM but did  not do long as pt reports feeling skin pulling. Added cervical stability with UE A/ROM which pt did well with. Instructed him that we may need to place him on hold as his skin fragility progresses until about 2 weeks after his last radiation treatment (which would be week of Dec 19). Pt verbalized understanding of  this.    Stability/Clinical Decision Making Stable/Uncomplicated    Rehab Potential Good    PT Frequency 1x / week    PT Duration 8 weeks    PT Treatment/Interventions ADLs/Self Care Home Management;Therapeutic exercise;Patient/family education;Manual techniques;Manual lymph drainage;Compression bandaging;Scar mobilization;Passive range of motion;Taping;Vasopneumatic Device    PT Next Visit Plan how was self MLD? cont  MLD and instruct pt and spouse and issue handout, did flexi send benefit info, neck ROM; place pt on hold until after radiation if skin fragility progresses    PT Home Exercise Plan head and neck ROM exercises, wear chip pack, self MLD    Consulted and Agree with Plan of Care Patient             Patient will benefit from skilled therapeutic intervention in order to improve the following deficits and impairments:  Postural dysfunction, Impaired flexibility, Increased fascial restricitons, Decreased knowledge of precautions, Decreased range of motion, Decreased scar mobility, Increased edema  Visit Diagnosis: Lymphedema, not elsewhere classified  Aftercare following surgery for neoplasm  Abnormal posture  Malignant neoplasm of base of tongue ([place])     Problem List Patient Active Problem List   Diagnosis Date Noted   Malignant neoplasm of base of tongue ([place]) 11/01/2020   Metastatic cancer to cervical lymph nodes ([place]) 10/23/2020   Metastatic squamous cell carcinoma to lymph node ([place]) 09/13/2020   Osteoarthritis, knee 08/29/2019   OSA (obstructive sleep apnea) 02/26/2019   Osteoarthritis of left shoulder 09/29/2018   History of left shoulder replacement  09/29/2018   Diabetes mellitus, type 2 ([place]) 07/29/2015   Essential hypertension 04/29/2015   Hyperlipidemia 04/29/2015   Morbid obesity ([place]) 04/29/2015    Otelia Limes, PTA 12/26/2020, 10:05 AM  [facility] [place], [place], [address] Phone: [phone]   Fax:  [phone]  Name: ARDIE MCLENNAN MRN: [id number] Date of Birth: 08/26/1960

## 2020-12-27 ENCOUNTER — Other Ambulatory Visit: Payer: Self-pay

## 2020-12-27 ENCOUNTER — Ambulatory Visit
Admission: RE | Admit: 2020-12-27 | Discharge: 2020-12-27 | Disposition: A | Discharge status: Home / Self Care | Payer: BC Managed Care – PPO | Source: Ambulatory Visit | Attending: Radiation Oncology | Admitting: Radiation Oncology

## 2020-12-27 DIAGNOSIS — C01 Malignant neoplasm of base of tongue: Secondary | ICD-10-CM | POA: Diagnosis not present

## 2020-12-27 DIAGNOSIS — R1310 Dysphagia, unspecified: Secondary | ICD-10-CM | POA: Diagnosis not present

## 2020-12-27 DIAGNOSIS — I890 Lymphedema, not elsewhere classified: Secondary | ICD-10-CM | POA: Diagnosis not present

## 2020-12-27 DIAGNOSIS — Z483 Aftercare following surgery for neoplasm: Secondary | ICD-10-CM | POA: Diagnosis not present

## 2020-12-27 DIAGNOSIS — R293 Abnormal posture: Secondary | ICD-10-CM | POA: Diagnosis not present

## 2020-12-27 DIAGNOSIS — C770 Secondary and unspecified malignant neoplasm of lymph nodes of head, face and neck: Secondary | ICD-10-CM | POA: Diagnosis not present

## 2020-12-29 ENCOUNTER — Ambulatory Visit: Payer: BC Managed Care – PPO

## 2020-12-30 ENCOUNTER — Ambulatory Visit
Admission: RE | Admit: 2020-12-30 | Discharge: 2020-12-30 | Disposition: A | Discharge status: Home / Self Care | Payer: BC Managed Care – PPO | Source: Ambulatory Visit | Attending: Radiation Oncology | Admitting: Radiation Oncology

## 2020-12-30 ENCOUNTER — Ambulatory Visit: Payer: BC Managed Care – PPO | Admitting: Physical Therapy

## 2020-12-30 ENCOUNTER — Other Ambulatory Visit: Payer: Self-pay

## 2020-12-30 DIAGNOSIS — C01 Malignant neoplasm of base of tongue: Secondary | ICD-10-CM

## 2020-12-30 DIAGNOSIS — C770 Secondary and unspecified malignant neoplasm of lymph nodes of head, face and neck: Secondary | ICD-10-CM | POA: Diagnosis not present

## 2020-12-30 DIAGNOSIS — R1310 Dysphagia, unspecified: Secondary | ICD-10-CM | POA: Diagnosis not present

## 2020-12-30 DIAGNOSIS — R293 Abnormal posture: Secondary | ICD-10-CM

## 2020-12-30 DIAGNOSIS — I890 Lymphedema, not elsewhere classified: Secondary | ICD-10-CM

## 2020-12-30 DIAGNOSIS — Z483 Aftercare following surgery for neoplasm: Secondary | ICD-10-CM

## 2020-12-30 NOTE — Therapy (Signed)
[facility] @ [place], [place], [address] Phone: [phone]   Fax:  [phone]  Physical Therapy Treatment  Patient Details  Name: Gregory Carey MRN: [id number] Date of Birth: [birth year] Referring Provider (PT): Isidore Moos   Encounter Date: 12/30/2020   PT End of Session - 12/30/20 0926     Visit Number 6   visit count unchanged due to arrived no charge   Number of Visits 9    Date for PT Re-Evaluation 01/16/21    PT Start Time 0902    PT Stop Time 0920    PT Time Calculation (min) 18 min    Activity Tolerance Treatment limited secondary to medical complications (Comment)   skin breakdown   Behavior During Therapy [facility] for tasks assessed/performed             Past Medical History:  Diagnosis Date   Cancer ([place])    neck   Complication of anesthesia    Diabetes mellitus without complication ([place])    Difficult intubation    Was infomed by MD Anesth his airway was small   Hyperlipidemia    Hypertension    Localized osteoarthritis of left shoulder    Osteoarthritis of left shoulder 09/29/2018   Sleep apnea    uses CPAP    Past Surgical History:  Procedure Laterality Date   BIOPSY  04/23/2020   Procedure: BIOPSY;  Surgeon: Eloise Harman, DO;  [facility];  Service: Endoscopy;;   COLONOSCOPY WITH PROPOFOL N/A 04/23/2020   Procedure: COLONOSCOPY WITH PROPOFOL;  Surgeon: Eloise Harman, DO;  [facility];  Service: Endoscopy;  Laterality: N/A;  am appt   DIRECT LARYNGOSCOPY N/A 10/23/2020   Procedure: DIRECT LARYNGOSCOPY WITH BIOPSIES;  Surgeon: Izora Gala, MD;  Location: [facility];  Service: ENT;  Laterality: N/A;   ESOPHAGOSCOPY N/A 10/23/2020   Procedure: ESOPHAGOSCOPY;  Surgeon: Izora Gala, MD;  Location: [facility];  Service: ENT;  Laterality: N/A;   KNEE ARTHROSCOPY Right 02/16/2006   POLYPECTOMY  04/23/2020   Procedure: POLYPECTOMY;  Surgeon: Eloise Harman, DO;   [facility];  Service: Endoscopy;;   RADICAL NECK DISSECTION Left 10/23/2020   Procedure: LEFT NECK DISSECTION;  Surgeon: Izora Gala, MD;  Location: [facility];  Service: ENT;  Laterality: Left;   SHOULDER ARTHROSCOPY Right 02/17/2000   SKIN CANCER EXCISION     Multiple basal cell and squmous cell leisons removed   TOTAL SHOULDER ARTHROPLASTY Left 09/29/2018   Procedure: TOTAL SHOULDER ARTHROPLASTY;  Surgeon: Nicholes Stairs, MD;  Location: [facility];  Service: Orthopedics;  Laterality: Left;  2.5 hrs    There were no vitals filed for this visit.   Subjective Assessment - 12/30/20 0922     Subjective My neck is starting to ooze a little now.    Pertinent History Metastatic SCC to lymph node and positive Base of Tongue SCC Stage 1 (T1, N1, M0, p 16 +), 08/30/20 Biopsy of left submandibular lymph node revealed poorly differentiated carcinoma, 7/25 PET revealing hypermetabolic left-sided level II cervical lymphadenopathy. PET also demonstrated relatively symmetric hypermetabolic FDG uptake identified in the anterior hypopharynx, and no discrete oro pharyngeal hypermetabolic lesion evident. Otherwise, no evidence of hypermetabolic metastatic disease was visualized in the chest, abdomen, or pelvis, 09/11/20 CT neck revealing two pathologic left level 2 lymph nodes as seen on prior PET-CT. No primary lesion or contralateral metastatic node was visualized, 10/23/20 patient underwent direct laryngoscopy and  neck dissection by Dr. Constance Holster with biopsies taken from the L BOT and L Tonsil. L BOT revealed invasive moderate to poorly differentiated SCC and the L Tonsil was negative, will receive 35 fractions of radiation to his BOT and bilateral neck. He had CT simulation today and will start 10/13, L shoulder replacement 10 yrs ago, pt reports he needs both knees replaced as well, diabetes    Patient Stated Goals to gain info from providers    Currently in Pain? --   pt did not rate his pain today but  reported tenderness in left side of neck in area of radiated skin                                            PT Long Term Goals - 11/21/20 1022       PT LONG TERM GOAL #1   Title Pt will be independent in self MLD for long term management of lymphedema.    Time 8    Period Weeks    Status New    Target Date 01/16/21      PT LONG TERM GOAL #2   Title Pt will demonstrate full cervical ROM in all directions to allow pt to return to PLOF.    Baseline R lateral flexion and extension are limited    Time 8    Period Weeks    Status New    Target Date 01/16/21      PT LONG TERM GOAL #3   Title Pt will receive trial of FlexiTouch compression pump for long term management of lymphedema.    Time 8    Period Weeks    Status New    Target Date 01/16/21      PT LONG TERM GOAL #4   Title Pt will obtain appropriate compression garment for long term management of head and neck lymphedema.    Time 8    Period Weeks    Status New    Target Date 01/16/21                   Plan - 12/30/20 2633     Clinical Impression Statement Pt arrived to appointment but was not treated today secondary to skin breakdown on left side of neck. Pt reports it is beginning to ooze some. Will place pt on hold until 2 weeks after he completes his last radiation treatment. Will resume PT the week of Dec 19th.    PT Frequency 1x / week    PT Duration 8 weeks    PT Treatment/Interventions ADLs/Self Care Home Management;Therapeutic exercise;Patient/family education;Manual techniques;Manual lymph drainage;Compression bandaging;Scar mobilization;Passive range of motion;Taping;Vasopneumatic Device    PT Next Visit Plan on hold until Dec 19th, update POC and cont MLD and cervical ROM    PT Home Exercise Plan head and neck ROM exercises, wear chip pack, self MLD    Consulted and Agree with Plan of Care Patient             Patient will benefit from skilled therapeutic  intervention in order to improve the following deficits and impairments:  Postural dysfunction, Impaired flexibility, Increased fascial restricitons, Decreased knowledge of precautions, Decreased range of motion, Decreased scar mobility, Increased edema  Visit Diagnosis: Lymphedema, not elsewhere classified  Aftercare following surgery for neoplasm  Abnormal posture  Malignant neoplasm of base of tongue ([place])  Problem List Patient Active Problem List   Diagnosis Date Noted   Malignant neoplasm of base of tongue ([place]) 11/01/2020   Metastatic cancer to cervical lymph nodes ([place]) 10/23/2020   Metastatic squamous cell carcinoma to lymph node ([place]) 09/13/2020   Osteoarthritis, knee 08/29/2019   OSA (obstructive sleep apnea) 02/26/2019   Osteoarthritis of left shoulder 09/29/2018   History of left shoulder replacement 09/29/2018   Diabetes mellitus, type 2 ([place]) 07/29/2015   Essential hypertension 04/29/2015   Hyperlipidemia 04/29/2015   Morbid obesity ([place]) 04/29/2015    Ahnesty Finfrock Breedlove Herminie, PT 12/30/2020, 9:28 AM  [facility] @ [place], [place], [address] Phone: [phone]   Fax:  [phone]  Name: Gregory Carey MRN: [id number] Date of Birth: 1960-03-27   Manus Gunning, PT 12/30/20 9:28 AM

## 2020-12-31 ENCOUNTER — Encounter: Payer: Self-pay | Admitting: Physical Therapy

## 2020-12-31 ENCOUNTER — Ambulatory Visit
Admission: RE | Admit: 2020-12-31 | Discharge: 2020-12-31 | Disposition: A | Discharge status: Home / Self Care | Payer: BC Managed Care – PPO | Source: Ambulatory Visit | Attending: Radiation Oncology | Admitting: Radiation Oncology

## 2020-12-31 DIAGNOSIS — R1310 Dysphagia, unspecified: Secondary | ICD-10-CM | POA: Diagnosis not present

## 2020-12-31 DIAGNOSIS — C770 Secondary and unspecified malignant neoplasm of lymph nodes of head, face and neck: Secondary | ICD-10-CM | POA: Diagnosis not present

## 2020-12-31 DIAGNOSIS — R293 Abnormal posture: Secondary | ICD-10-CM | POA: Diagnosis not present

## 2020-12-31 DIAGNOSIS — C01 Malignant neoplasm of base of tongue: Secondary | ICD-10-CM | POA: Diagnosis not present

## 2020-12-31 DIAGNOSIS — I890 Lymphedema, not elsewhere classified: Secondary | ICD-10-CM | POA: Diagnosis not present

## 2020-12-31 DIAGNOSIS — Z483 Aftercare following surgery for neoplasm: Secondary | ICD-10-CM | POA: Diagnosis not present

## 2021-01-01 ENCOUNTER — Inpatient Hospital Stay: Payer: BC Managed Care – PPO | Admitting: Dietician

## 2021-01-01 ENCOUNTER — Other Ambulatory Visit: Payer: Self-pay

## 2021-01-01 ENCOUNTER — Ambulatory Visit
Admission: RE | Admit: 2021-01-01 | Discharge: 2021-01-01 | Disposition: A | Discharge status: Home / Self Care | Payer: BC Managed Care – PPO | Source: Ambulatory Visit | Attending: Radiation Oncology | Admitting: Radiation Oncology

## 2021-01-01 ENCOUNTER — Other Ambulatory Visit: Payer: Self-pay | Admitting: Radiation Oncology

## 2021-01-01 DIAGNOSIS — C01 Malignant neoplasm of base of tongue: Secondary | ICD-10-CM

## 2021-01-01 DIAGNOSIS — Z483 Aftercare following surgery for neoplasm: Secondary | ICD-10-CM | POA: Diagnosis not present

## 2021-01-01 DIAGNOSIS — I890 Lymphedema, not elsewhere classified: Secondary | ICD-10-CM | POA: Diagnosis not present

## 2021-01-01 DIAGNOSIS — R1310 Dysphagia, unspecified: Secondary | ICD-10-CM | POA: Diagnosis not present

## 2021-01-01 DIAGNOSIS — C770 Secondary and unspecified malignant neoplasm of lymph nodes of head, face and neck: Secondary | ICD-10-CM

## 2021-01-01 DIAGNOSIS — R293 Abnormal posture: Secondary | ICD-10-CM | POA: Diagnosis not present

## 2021-01-01 MED ORDER — ONDANSETRON 8 MG PO TBDP: 8.0000 mg | ORAL_TABLET | Freq: Three times a day (TID) | ORAL | 1 refills | Status: DC | PRN

## 2021-01-01 MED ORDER — SONAFINE EX EMUL
1.0000 "application " | Freq: Two times a day (BID) | CUTANEOUS | Status: DC
  Administered 2021-01-01: 1 via TOPICAL

## 2021-01-01 NOTE — Progress Notes (Signed)
Oncology Nurse Navigator Documentation   Gregory Carey reported occasional nausea/vomiting and has requested nausea medicine. Dr. Isidore Moos was made aware and has prescribed zofran. I called Gregory Carey wife and made her aware, she voiced her understanding.  Gregory Asa RN, BSN, OCN Head & Neck Oncology Nurse [place] at [facility] Phone # [phone]  Fax # [phone]

## 2021-01-01 NOTE — Progress Notes (Signed)
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   Met with patient and wife after radiation. He reports sore throat, increased pain to side of neck. He has been having intermittent nausea, states "it just hits him" and "stomach will roll for hours." Patient reports vomiting small amounts on a couple of occassions. Recalls one episode of vomiting after lidocaine rinse, he is unable to tolerate this. Wife asking if he could try using a chloraseptic spray. Messaged nurse navigator who advised patient could try non-alcoholic spray however this may still burn. Patient denies swallowing difficulties, he is having altered taste, reports able to taste sweet foods on the tip of his tongue. Patient has tried shake recipes, reports having a banana milkshake yesterday, said the banana burned his throat. He has decreased appetite, eating soft, moist foods (chicken dumplings, fish, chicken, hushspuppies). Patient had subway breakfast sandwich this morning. Patient reports drinking 3 Ensure Enlive day, he has requested additional complimentary case today.    Anthropometrics: Weight 256.8 lb on 11/14 decreased 4 lbs (1.5%) in one week. This is significant. Patient weighed 260.8 lb on 11/7.   10/31 - 263.6 lb 10/24 - 267 lb  NUTRITION DIAGNOSIS: Inadequate oral intake continues    INTERVENTION:  Continue eating soft moist high protein foods Continue drinking 3 Ensure Enlive/equivalent daily, encouraged increasing to 4 Ensure as needed with decreased intake  Provided one complimentary case of Ensure Enlive  Continue baking soda, salt water rinses several times/day Discussed strategies for nausea, foods best tolerated and foods to limit/avoid Nurse navigator to discuss pt request for nausea medication with MD  Patient has contact information   MONITORING, EVALUATION, GOAL: weight trends, intake    NEXT VISIT: Tuesday, November 22

## 2021-01-02 ENCOUNTER — Ambulatory Visit
Admission: RE | Admit: 2021-01-02 | Discharge: 2021-01-02 | Disposition: A | Discharge status: Home / Self Care | Payer: BC Managed Care – PPO | Source: Ambulatory Visit | Attending: Radiation Oncology | Admitting: Radiation Oncology

## 2021-01-02 DIAGNOSIS — C770 Secondary and unspecified malignant neoplasm of lymph nodes of head, face and neck: Secondary | ICD-10-CM | POA: Diagnosis not present

## 2021-01-02 DIAGNOSIS — C01 Malignant neoplasm of base of tongue: Secondary | ICD-10-CM | POA: Diagnosis not present

## 2021-01-02 DIAGNOSIS — R1310 Dysphagia, unspecified: Secondary | ICD-10-CM | POA: Diagnosis not present

## 2021-01-02 DIAGNOSIS — R293 Abnormal posture: Secondary | ICD-10-CM | POA: Diagnosis not present

## 2021-01-02 DIAGNOSIS — Z483 Aftercare following surgery for neoplasm: Secondary | ICD-10-CM | POA: Diagnosis not present

## 2021-01-02 DIAGNOSIS — I890 Lymphedema, not elsewhere classified: Secondary | ICD-10-CM | POA: Diagnosis not present

## 2021-01-03 ENCOUNTER — Other Ambulatory Visit: Payer: Self-pay

## 2021-01-03 ENCOUNTER — Ambulatory Visit
Admission: RE | Admit: 2021-01-03 | Discharge: 2021-01-03 | Disposition: A | Discharge status: Home / Self Care | Payer: BC Managed Care – PPO | Source: Ambulatory Visit | Attending: Radiation Oncology | Admitting: Radiation Oncology

## 2021-01-03 DIAGNOSIS — C770 Secondary and unspecified malignant neoplasm of lymph nodes of head, face and neck: Secondary | ICD-10-CM | POA: Diagnosis not present

## 2021-01-03 DIAGNOSIS — C01 Malignant neoplasm of base of tongue: Secondary | ICD-10-CM | POA: Diagnosis not present

## 2021-01-03 DIAGNOSIS — R1310 Dysphagia, unspecified: Secondary | ICD-10-CM | POA: Diagnosis not present

## 2021-01-03 DIAGNOSIS — I890 Lymphedema, not elsewhere classified: Secondary | ICD-10-CM | POA: Diagnosis not present

## 2021-01-03 DIAGNOSIS — Z483 Aftercare following surgery for neoplasm: Secondary | ICD-10-CM | POA: Diagnosis not present

## 2021-01-03 DIAGNOSIS — R293 Abnormal posture: Secondary | ICD-10-CM | POA: Diagnosis not present

## 2021-01-05 ENCOUNTER — Ambulatory Visit
Admission: RE | Admit: 2021-01-05 | Discharge: 2021-01-05 | Disposition: A | Discharge status: Home / Self Care | Payer: BC Managed Care – PPO | Source: Ambulatory Visit | Attending: Radiation Oncology | Admitting: Radiation Oncology

## 2021-01-05 DIAGNOSIS — R293 Abnormal posture: Secondary | ICD-10-CM | POA: Diagnosis not present

## 2021-01-05 DIAGNOSIS — C01 Malignant neoplasm of base of tongue: Secondary | ICD-10-CM | POA: Diagnosis not present

## 2021-01-05 DIAGNOSIS — I890 Lymphedema, not elsewhere classified: Secondary | ICD-10-CM | POA: Diagnosis not present

## 2021-01-05 DIAGNOSIS — Z483 Aftercare following surgery for neoplasm: Secondary | ICD-10-CM | POA: Diagnosis not present

## 2021-01-05 DIAGNOSIS — C770 Secondary and unspecified malignant neoplasm of lymph nodes of head, face and neck: Secondary | ICD-10-CM | POA: Diagnosis not present

## 2021-01-05 DIAGNOSIS — R1310 Dysphagia, unspecified: Secondary | ICD-10-CM | POA: Diagnosis not present

## 2021-01-06 ENCOUNTER — Other Ambulatory Visit: Payer: Self-pay

## 2021-01-06 ENCOUNTER — Ambulatory Visit
Admission: RE | Admit: 2021-01-06 | Discharge: 2021-01-06 | Disposition: A | Discharge status: Home / Self Care | Payer: BC Managed Care – PPO | Source: Ambulatory Visit | Attending: Radiation Oncology | Admitting: Radiation Oncology

## 2021-01-06 DIAGNOSIS — R293 Abnormal posture: Secondary | ICD-10-CM | POA: Diagnosis not present

## 2021-01-06 DIAGNOSIS — I890 Lymphedema, not elsewhere classified: Secondary | ICD-10-CM | POA: Diagnosis not present

## 2021-01-06 DIAGNOSIS — C01 Malignant neoplasm of base of tongue: Secondary | ICD-10-CM | POA: Diagnosis not present

## 2021-01-06 DIAGNOSIS — C770 Secondary and unspecified malignant neoplasm of lymph nodes of head, face and neck: Secondary | ICD-10-CM | POA: Diagnosis not present

## 2021-01-06 DIAGNOSIS — Z483 Aftercare following surgery for neoplasm: Secondary | ICD-10-CM | POA: Diagnosis not present

## 2021-01-06 DIAGNOSIS — R1310 Dysphagia, unspecified: Secondary | ICD-10-CM | POA: Diagnosis not present

## 2021-01-07 ENCOUNTER — Ambulatory Visit
Admission: RE | Admit: 2021-01-07 | Discharge: 2021-01-07 | Disposition: A | Discharge status: Home / Self Care | Payer: BC Managed Care – PPO | Source: Ambulatory Visit | Attending: Radiation Oncology | Admitting: Radiation Oncology

## 2021-01-07 ENCOUNTER — Inpatient Hospital Stay: Payer: BC Managed Care – PPO | Admitting: Dietician

## 2021-01-07 DIAGNOSIS — Z483 Aftercare following surgery for neoplasm: Secondary | ICD-10-CM | POA: Diagnosis not present

## 2021-01-07 DIAGNOSIS — R293 Abnormal posture: Secondary | ICD-10-CM | POA: Diagnosis not present

## 2021-01-07 DIAGNOSIS — R1310 Dysphagia, unspecified: Secondary | ICD-10-CM | POA: Diagnosis not present

## 2021-01-07 DIAGNOSIS — C770 Secondary and unspecified malignant neoplasm of lymph nodes of head, face and neck: Secondary | ICD-10-CM | POA: Diagnosis not present

## 2021-01-07 DIAGNOSIS — I890 Lymphedema, not elsewhere classified: Secondary | ICD-10-CM | POA: Diagnosis not present

## 2021-01-07 DIAGNOSIS — C01 Malignant neoplasm of base of tongue: Secondary | ICD-10-CM | POA: Diagnosis not present

## 2021-01-07 NOTE — Progress Notes (Signed)
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   Met with patient in office. He reports increased fatigue, sore throat, swallowing difficulty with breads/cookies. Patient reports foods have no taste and appetite is decreased. Yesterday patient ate a few bites of bacon off of a bacon,egg,cheese biscuit. He reports unable to eat the biscuit and the egg did not resemble an egg so he did not eat it. He drank a large soup bowl of chicken broth (~2 cups), drank 3 Ensure Enlive and 64 oz of water. Patient usually will eat a bowl of ice cream in the evenings, per wife he has not wanted this the last couple nights. He is using baking soda salt water rinses several times daily.    Medications: Zofran  Anthropometrics: Weights continue to trend down. Last weight 252 lb on 11/21 decreased 4.8 lbs (1.9%) in one week. Patient weighed 256.8 lb on 11/14 decreased from 260.8 lb on 11/7. Patient has lost 23 lbs (8.4%) in the last 10 weeks. This is severe.   10/31 - 263.6 lb  10/24 - 267 lb 9/6 - 274 lb 12.8 oz   Nutrition Focused Physical Exam: (Exam completed today limited due to patient being fully dressed) -mild fat depletion to orbital and buccal regions -Mild muscle depletion to temple and dorsal hand regions -No muscle depletion to clavicle, acromion process region  Estimated Energy Needs  Kcals: 8483-5075 Protein: 133-145 Fluid: 2.5 L  NUTRITION DIAGNOSIS: Inadequate oral intake continues.    MALNUTRITION DIAGNOSIS: Patient meets criteria for moderate malnutrition in the context of acute illness (associated side effects of radiation therapy for base of tongue cancer) as evidenced by mild fat and muscle depletion, intake meeting <75% of estimated energy needs >7 days per dietary recall, and 23 lb (8.4%) weight loss in 10 weeks. Patient is at risk for worsening nutritional status given progression of nutrition impact symptoms affecting ability to eat orally.     INTERVENTION:  Reinforced importance of adequate calorie and protein energy intake to maintain weights, strength, nutrition Encouraged soft, smooth high calorie, high protein foods and reviewed strategies for increasing calories/protein with wife  Recommend patient increase to 5 Ensure Enlive/equivalent day (350 kcal, 20 g protein each) Provided complimentary case of Ensure Enlive Continue baking soda salt water rinses several times daily Provided support and encouragement Consider placement of feeding tube if patient weights continue to decrease and oral intake does not improve     MONITORING, EVALUATION, GOAL: weight trends, intake   NEXT VISIT: Tuesday November 29

## 2021-01-08 ENCOUNTER — Ambulatory Visit
Admission: RE | Admit: 2021-01-08 | Discharge: 2021-01-08 | Disposition: A | Discharge status: Home / Self Care | Payer: BC Managed Care – PPO | Source: Ambulatory Visit | Attending: Radiation Oncology | Admitting: Radiation Oncology

## 2021-01-08 DIAGNOSIS — I890 Lymphedema, not elsewhere classified: Secondary | ICD-10-CM | POA: Diagnosis not present

## 2021-01-08 DIAGNOSIS — R1310 Dysphagia, unspecified: Secondary | ICD-10-CM | POA: Diagnosis not present

## 2021-01-08 DIAGNOSIS — C01 Malignant neoplasm of base of tongue: Secondary | ICD-10-CM | POA: Diagnosis not present

## 2021-01-08 DIAGNOSIS — R293 Abnormal posture: Secondary | ICD-10-CM | POA: Diagnosis not present

## 2021-01-08 DIAGNOSIS — C770 Secondary and unspecified malignant neoplasm of lymph nodes of head, face and neck: Secondary | ICD-10-CM | POA: Diagnosis not present

## 2021-01-08 DIAGNOSIS — Z483 Aftercare following surgery for neoplasm: Secondary | ICD-10-CM | POA: Diagnosis not present

## 2021-01-13 ENCOUNTER — Ambulatory Visit
Admission: RE | Admit: 2021-01-13 | Discharge: 2021-01-13 | Disposition: A | Discharge status: Home / Self Care | Payer: BC Managed Care – PPO | Source: Ambulatory Visit | Attending: Radiation Oncology | Admitting: Radiation Oncology

## 2021-01-13 ENCOUNTER — Other Ambulatory Visit: Payer: Self-pay

## 2021-01-13 DIAGNOSIS — R293 Abnormal posture: Secondary | ICD-10-CM | POA: Diagnosis not present

## 2021-01-13 DIAGNOSIS — Z483 Aftercare following surgery for neoplasm: Secondary | ICD-10-CM | POA: Diagnosis not present

## 2021-01-13 DIAGNOSIS — R1310 Dysphagia, unspecified: Secondary | ICD-10-CM | POA: Diagnosis not present

## 2021-01-13 DIAGNOSIS — I890 Lymphedema, not elsewhere classified: Secondary | ICD-10-CM | POA: Diagnosis not present

## 2021-01-13 DIAGNOSIS — C770 Secondary and unspecified malignant neoplasm of lymph nodes of head, face and neck: Secondary | ICD-10-CM | POA: Diagnosis not present

## 2021-01-13 DIAGNOSIS — C01 Malignant neoplasm of base of tongue: Secondary | ICD-10-CM | POA: Diagnosis not present

## 2021-01-14 ENCOUNTER — Ambulatory Visit
Admission: RE | Admit: 2021-01-14 | Discharge: 2021-01-14 | Disposition: A | Discharge status: Home / Self Care | Payer: BC Managed Care – PPO | Source: Ambulatory Visit | Attending: Radiation Oncology | Admitting: Radiation Oncology

## 2021-01-14 ENCOUNTER — Ambulatory Visit: Payer: Self-pay | Admitting: Dietician

## 2021-01-14 ENCOUNTER — Other Ambulatory Visit: Payer: Self-pay | Admitting: Family

## 2021-01-14 DIAGNOSIS — C770 Secondary and unspecified malignant neoplasm of lymph nodes of head, face and neck: Secondary | ICD-10-CM | POA: Diagnosis not present

## 2021-01-14 DIAGNOSIS — C01 Malignant neoplasm of base of tongue: Secondary | ICD-10-CM | POA: Diagnosis not present

## 2021-01-14 DIAGNOSIS — I890 Lymphedema, not elsewhere classified: Secondary | ICD-10-CM | POA: Diagnosis not present

## 2021-01-14 DIAGNOSIS — Z483 Aftercare following surgery for neoplasm: Secondary | ICD-10-CM | POA: Diagnosis not present

## 2021-01-14 DIAGNOSIS — R293 Abnormal posture: Secondary | ICD-10-CM | POA: Diagnosis not present

## 2021-01-14 DIAGNOSIS — E1165 Type 2 diabetes mellitus with hyperglycemia: Secondary | ICD-10-CM

## 2021-01-14 DIAGNOSIS — R1310 Dysphagia, unspecified: Secondary | ICD-10-CM | POA: Diagnosis not present

## 2021-01-14 NOTE — Progress Notes (Signed)
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   Met with patient and wife in clinic. He reports feeling better after having an extra day off from radiation during Thanksgiving holiday. He is looking forward to his final treatment on Wednesday. Patient reports his mouth taste salty and metallic. His throat is sore. He is eating small amounts of soft moist foods. Patient had a few pieces of [place] with gravy for Thanksgiving. Patient has increased intake of oral supplements. He is drinking 3-4 Ensure and 1 CIB mixed with whole milk.    Medications: reviewed  Labs: reviewed  Anthropometrics: Per pt [facility] weight on 11/28 (262 lb) entered in error. Patient reports he weighed 249.8 lb. Weights decreased 2.2 lb in 7 days. Patient has lost 25 lbs (9.1%) in the last 12 weeks. This is severe.    NUTRITION DIAGNOSIS: Inadequate oral intake continues but improving   MALNUTRITION DIAGNOSIS: Moderate malnutrition continues   INTERVENTION:  Educated on ongoing increased calorie and protein needs to support healing post treatment Continue drinking 2 CIB and 3 Ensure Plus daily, Ensure coupons provided Encouraged soft, moist high protein foods Continue baking soda salt water rinses several times/day    MONITORING, EVALUATION, GOAL: weight trends, intake   NEXT VISIT: Wednesday December 21

## 2021-01-15 ENCOUNTER — Ambulatory Visit
Admission: RE | Admit: 2021-01-15 | Discharge: 2021-01-15 | Disposition: A | Discharge status: Home / Self Care | Payer: BC Managed Care – PPO | Source: Ambulatory Visit | Attending: Radiation Oncology | Admitting: Radiation Oncology

## 2021-01-15 ENCOUNTER — Inpatient Hospital Stay: Payer: BC Managed Care – PPO | Admitting: Dietician

## 2021-01-15 ENCOUNTER — Other Ambulatory Visit: Payer: Self-pay

## 2021-01-15 DIAGNOSIS — R1310 Dysphagia, unspecified: Secondary | ICD-10-CM | POA: Diagnosis not present

## 2021-01-15 DIAGNOSIS — Z483 Aftercare following surgery for neoplasm: Secondary | ICD-10-CM | POA: Diagnosis not present

## 2021-01-15 DIAGNOSIS — C770 Secondary and unspecified malignant neoplasm of lymph nodes of head, face and neck: Secondary | ICD-10-CM | POA: Diagnosis not present

## 2021-01-15 DIAGNOSIS — I890 Lymphedema, not elsewhere classified: Secondary | ICD-10-CM | POA: Diagnosis not present

## 2021-01-15 DIAGNOSIS — C01 Malignant neoplasm of base of tongue: Secondary | ICD-10-CM | POA: Diagnosis not present

## 2021-01-15 DIAGNOSIS — R293 Abnormal posture: Secondary | ICD-10-CM | POA: Diagnosis not present

## 2021-01-16 ENCOUNTER — Ambulatory Visit: Payer: BC Managed Care – PPO

## 2021-01-16 ENCOUNTER — Encounter: Payer: Self-pay | Admitting: Radiation Oncology

## 2021-01-16 ENCOUNTER — Ambulatory Visit
Admission: RE | Admit: 2021-01-16 | Discharge: 2021-01-16 | Disposition: A | Discharge status: Home / Self Care | Payer: BC Managed Care – PPO | Source: Ambulatory Visit | Attending: Radiation Oncology | Admitting: Radiation Oncology

## 2021-01-16 DIAGNOSIS — C01 Malignant neoplasm of base of tongue: Secondary | ICD-10-CM | POA: Insufficient documentation

## 2021-01-16 DIAGNOSIS — C770 Secondary and unspecified malignant neoplasm of lymph nodes of head, face and neck: Secondary | ICD-10-CM | POA: Diagnosis not present

## 2021-01-16 NOTE — Progress Notes (Signed)
Oncology Nurse Navigator Documentation   Met with Gregory Carey after final RT to offer support and to celebrate end of radiation treatment.   Provided verbal/written post-RT guidance: Importance of keeping all follow-up appts, especially those with Nutrition and SLP. Importance of protecting treatment area from sun. Continuation of Sonafine application 2-3 times daily, application of antibiotic ointment to areas of raw skin; when supply of Sonafine exhausted transition to OTC lotion with vitamin E. Provided/reviewed Epic calendar of upcoming appts. Explained my role as navigator will continue for several more months, encouraged him to call me with needs/concerns.    Harlow Asa RN, BSN, OCN Head & Neck Oncology Nurse [place] at [facility] Phone # [phone]  Fax # [phone]

## 2021-01-17 ENCOUNTER — Ambulatory Visit: Payer: BC Managed Care – PPO

## 2021-01-23 ENCOUNTER — Ambulatory Visit: Payer: BC Managed Care – PPO | Attending: Radiation Oncology

## 2021-01-23 ENCOUNTER — Other Ambulatory Visit: Payer: Self-pay

## 2021-01-23 DIAGNOSIS — R1310 Dysphagia, unspecified: Secondary | ICD-10-CM | POA: Insufficient documentation

## 2021-01-23 NOTE — Patient Instructions (Signed)
Signs of Aspiration Pneumonia   Chest pain/tightness Fever (can be low grade) Cough  With foul-smelling phlegm (sputum) With sputum containing pus or blood With greenish sputum Fatigue  Shortness of breath  Wheezing   **IF YOU HAVE THESE SIGNS, CONTACT YOUR DOCTOR OR GO TO THE EMERGENCY DEPARTMENT OR URGENT CARE AS SOON AS POSSIBLE**     Information about food journaling was also provided

## 2021-01-23 NOTE — Therapy (Signed)
[facility] [address], [place], [place], [address] Phone: [phone]   Fax:  [phone]  Speech Language Pathology Treatment  Patient Details  Name: Gregory Carey MRN: [id number] Date of Birth: 10-May-1960 Referring Provider (SLP): Eppie Gibson, MD   Encounter Date: 01/23/2021   End of Session - 01/23/21 1241     Visit Number 3    Number of Visits 4    Date for SLP Re-Evaluation 02/19/21    SLP Start Time 1020    SLP Stop Time  1100    SLP Time Calculation (min) 40 min    Activity Tolerance Patient tolerated treatment well             Past Medical History:  Diagnosis Date   Cancer ([place])    neck   Complication of anesthesia    Diabetes mellitus without complication ([place])    Difficult intubation    Was infomed by MD Anesth his airway was small   Hyperlipidemia    Hypertension    Localized osteoarthritis of left shoulder    Osteoarthritis of left shoulder 09/29/2018   Sleep apnea    uses CPAP    Past Surgical History:  Procedure Laterality Date   BIOPSY  04/23/2020   Procedure: BIOPSY;  Surgeon: Eloise Harman, DO;  [facility];  Service: Endoscopy;;   COLONOSCOPY WITH PROPOFOL N/A 04/23/2020   Procedure: COLONOSCOPY WITH PROPOFOL;  Surgeon: Eloise Harman, DO;  [facility];  Service: Endoscopy;  Laterality: N/A;  am appt   DIRECT LARYNGOSCOPY N/A 10/23/2020   Procedure: DIRECT LARYNGOSCOPY WITH BIOPSIES;  Surgeon: Izora Gala, MD;  Location: [facility];  Service: ENT;  Laterality: N/A;   ESOPHAGOSCOPY N/A 10/23/2020   Procedure: ESOPHAGOSCOPY;  Surgeon: Izora Gala, MD;  Location: [facility];  Service: ENT;  Laterality: N/A;   KNEE ARTHROSCOPY Right 02/16/2006   POLYPECTOMY  04/23/2020   Procedure: POLYPECTOMY;  Surgeon: Eloise Harman, DO;  [facility];  Service: Endoscopy;;   RADICAL NECK DISSECTION Left 10/23/2020   Procedure: LEFT NECK DISSECTION;  Surgeon: Izora Gala,  MD;  Location: [facility];  Service: ENT;  Laterality: Left;   SHOULDER ARTHROSCOPY Right 02/17/2000   SKIN CANCER EXCISION     Multiple basal cell and squmous cell leisons removed   TOTAL SHOULDER ARTHROPLASTY Left 09/29/2018   Procedure: TOTAL SHOULDER ARTHROPLASTY;  Surgeon: Nicholes Stairs, MD;  Location: [facility];  Service: Orthopedics;  Laterality: Left;  2.5 hrs    There were no vitals filed for this visit.   Subjective Assessment - 01/23/21 1027     Currently in Pain? Yes    Pain Score 4     Pain Location Throat    Pain Orientation Right;Left    Pain Descriptors / Indicators Sore    Pain Type Acute pain    Pain Radiating Towards ears - if lets Tylenol wear off    Pain Onset 1 to 4 weeks ago    Pain Frequency Constant                   ADULT SLP TREATMENT - 01/23/21 1029       General Information   Behavior/Cognition Alert;Cooperative;Pleasant mood      Treatment Provided   Treatment provided Dysphagia      Dysphagia Treatment   Temperature Spikes Noted No    Oral Cavity - Dentition Adequate natural dentition    Treatment  Methods Skilled observation    Type of PO's observed Dysphagia 1 (puree);Thin liquids    Liquids provided via Cup    Oral Phase Signs & Symptoms Other (comment)   none noted   Pharyngeal Phase Signs & Symptoms Other (comment)    Other treatment/comments In the last week pt has had bites of cheeseburger and ham and cheese sandwiches (minus 1/2 or whole bun), dark meat chicken. Reports coughing with talking or "just hits me". Pt clearing throat throughout session. Denies any coughing with POs. SLP educated about food journal and aspiration PNA s/sx with handout. Pt voiced understanding. He req'd min A rarely with HEP. Was performing reduced scope and frequency (3x/week, rare supraglottic, no/rare CTAR). SLP also educated about pills in puree to aid pharyngeal transit.      Assessment / Recommendations / Plan   Plan Continue with current plan  of care      Dysphagia Recommendations   Diet recommendations --   as tolerated   Medication Administration --   as tolerated     Progression Toward Goals   Progression toward goals Progressing toward goals              SLP Education - 01/23/21 1240     Education Details s/sx aspiration PNA, food journal, see "skilled intervention" for more details    Person(s) Educated Patient;Caregiver(s)    Methods Explanation;Handout    Comprehension Verbalized understanding;Returned demonstration;Need further instruction             SLP Short Term Goals - 01/23/21 SLP SHORT TERM GOAL #1      Title pt will complete HEP with rare min A    Time      Period --   vists, for all STGs    Status Achieved           SLP SHORT TERM GOAL #2    Title pt will tell SLP why pt is completing HEP with modified independence    Time     Status Achieved           SLP SHORT TERM GOAL #3    Title pt will describe 3 overt s/s aspiration PNA with modified independence    Time     Status Achieved           SLP SHORT TERM GOAL #4    Title pt will tell SLP how a food journal could hasten return to a more normalized diet    Time     Status  Achieved                    SLP Long Term Goals - 01/23/21                      SLP LONG TERM GOAL #1    Title pt will complete HEP with modified independence over two visits    Time 2    Period --   visits, for all LTGs    Status Ongoing           SLP LONG TERM GOAL #2    Title pt will describe how to modify HEP over time, and the timeline associated with reduction in HEP frequency with modified independence over two sessions    Time 3    Status Ongoing                    Plan - 01/23/21  Clinical Impression Statement At this time pt swallowing is deemed WNL/WFL with dys I-II-III (as tolerated) and thin liquids. SLP reviewed pt's individualized HEP for dysphagia and pt completed each exercise on their own independently by session end.  There are no overt s/s aspiration reported by pt at this time, although pt cleared throat throughout session- thick saliva(?). Data indicate that pt's swallow ability will likely decrease over the course of radiation/chemotherapy and could very well decline over time following conclusion of their radiation therapy due to muscle disuse atrophy and/or muscle fibrosis. Pt will cont to need to be seen by SLP in order to assess safety of PO intake, assess the need for recommending any objective swallow assessment, and ensuring pt correctly completes the individualized HEP.    Speech Therapy Frequency --   once approx every four weeks    Duration --   90 days for this reporting period; overall, approx 7 visits    Treatment/Interventions Aspiration precaution training;Pharyngeal strengthening exercises;Diet toleration management by SLP;Trials of upgraded texture/liquids;Patient/family education;SLP instruction and feedback;Compensatory techniques    Potential to Achieve Goals Good    SLP Home Exercise Plan provided    Consulted and Agree with Plan of Care Patient             Patient will benefit from skilled therapeutic intervention in order to improve the following deficits and impairments:   Dysphagia, unspecified type    Problem List Patient Active Problem List   Diagnosis Date Noted   Malignant neoplasm of base of tongue ([place]) 11/01/2020   Metastatic cancer to cervical lymph nodes ([place]) 10/23/2020   Metastatic squamous cell carcinoma to lymph node ([place]) 09/13/2020   Osteoarthritis, knee 08/29/2019   OSA (obstructive sleep apnea) 02/26/2019   Osteoarthritis of left shoulder 09/29/2018   History of left shoulder replacement 09/29/2018   Diabetes mellitus, type 2 ([place]) 07/29/2015   Essential hypertension 04/29/2015   Hyperlipidemia 04/29/2015   Morbid obesity ([place]) 04/29/2015    [place], [place] 01/23/2021, 12:41 PM  [facility] [address], [place], [place], [address] Phone: [phone]   Fax:  [phone]   Name: Gregory Carey MRN: [id number] Date of Birth: [birth year]

## 2021-01-29 ENCOUNTER — Ambulatory Visit (INDEPENDENT_AMBULATORY_CARE_PROVIDER_SITE_OTHER): Payer: BC Managed Care – PPO | Admitting: Family

## 2021-01-29 ENCOUNTER — Encounter: Payer: Self-pay | Admitting: Family

## 2021-01-29 VITALS — BP 109/66 | HR 87 | Temp 97.2°F | Ht 70.0 in | Wt 240.4 lb

## 2021-01-29 DIAGNOSIS — I10 Essential (primary) hypertension: Secondary | ICD-10-CM | POA: Diagnosis not present

## 2021-01-29 DIAGNOSIS — Z23 Encounter for immunization: Secondary | ICD-10-CM | POA: Diagnosis not present

## 2021-01-29 DIAGNOSIS — Z794 Long term (current) use of insulin: Secondary | ICD-10-CM | POA: Diagnosis not present

## 2021-01-29 DIAGNOSIS — E1165 Type 2 diabetes mellitus with hyperglycemia: Secondary | ICD-10-CM

## 2021-01-29 DIAGNOSIS — C01 Malignant neoplasm of base of tongue: Secondary | ICD-10-CM | POA: Diagnosis not present

## 2021-01-29 DIAGNOSIS — E785 Hyperlipidemia, unspecified: Secondary | ICD-10-CM

## 2021-01-29 LAB — BAYER DCA HB A1C WAIVED: HB A1C (BAYER DCA - WAIVED): 6.2 % — ABNORMAL HIGH (ref 4.8–5.6)

## 2021-01-29 NOTE — Patient Instructions (Signed)

## 2021-01-29 NOTE — Progress Notes (Signed)
Subjective:    Patient ID: Gregory Carey, male    DOB: 01-03-1961, 60 y.o.   MRN: [id number]  Chief Complaint  Patient presents with   Medical Management of Chronic Issues   Pt presents today for chronic follow up. He was recently diagnosed with metastatic squamous neck cancer. He had surgery on 10/23/20 for laryngoscopy and biopsy. He completed radiation. He is followed by Oncologists.  Hypertension This is a chronic problem. The current episode started more than 1 year ago. The problem has been resolved since onset. The problem is controlled. Pertinent negatives include no blurred vision, malaise/fatigue, peripheral edema or shortness of breath. Risk factors for coronary artery disease include dyslipidemia, obesity and male gender. The current treatment provides moderate improvement.  Diabetes He presents for his follow-up diabetic visit. He has type 2 diabetes mellitus. Pertinent negatives for diabetes include no blurred vision and no foot paresthesias. Symptoms are stable. Risk factors for coronary artery disease include dyslipidemia, diabetes mellitus, male sex, hypertension and sedentary lifestyle. He is following a generally healthy diet. His overall blood glucose range is 90-110 mg/dl.  Hyperlipidemia This is a chronic problem. The current episode started more than 1 year ago. Exacerbating diseases include obesity. Pertinent negatives include no shortness of breath. Current antihyperlipidemic treatment includes statins. The current treatment provides moderate improvement of lipids. Risk factors for coronary artery disease include dyslipidemia, diabetes mellitus, male sex, hypertension and a sedentary lifestyle.  Arthritis Presents for follow-up visit. He complains of pain and stiffness. The symptoms have been stable. Affected locations include the right knee and left knee. His pain is at a severity of 2/10.     Review of Systems  Constitutional:  Negative for malaise/fatigue.  Eyes:   Negative for blurred vision.  Respiratory:  Negative for shortness of breath.   Musculoskeletal:  Positive for arthritis and stiffness.  All other systems reviewed and are negative.     Objective:   Physical Exam Vitals reviewed.  Constitutional:      General: He is not in acute distress.    Appearance: He is well-developed. He is obese.  HENT:     Head: Normocephalic.     Right Ear: Tympanic membrane normal.     Left Ear: Tympanic membrane normal.  Eyes:     General:        Right eye: No discharge.        Left eye: No discharge.     Pupils: Pupils are equal, round, and reactive to light.  Neck:     Thyroid: No thyromegaly.  Cardiovascular:     Rate and Rhythm: Normal rate and regular rhythm.     Heart sounds: Normal heart sounds. No murmur heard. Pulmonary:     Effort: Pulmonary effort is normal. No respiratory distress.     Breath sounds: Normal breath sounds. No wheezing.  Abdominal:     General: Bowel sounds are normal. There is no distension.     Palpations: Abdomen is soft.     Tenderness: There is no abdominal tenderness.  Musculoskeletal:        General: No tenderness. Normal range of motion.     Cervical back: Normal range of motion and neck supple.  Skin:    General: Skin is warm and dry.     Findings: No erythema or rash.  Neurological:     Mental Status: He is alert and oriented to person, place, and time.     Cranial Nerves: No cranial nerve deficit.  Deep Tendon Reflexes: Reflexes are normal and symmetric.  Psychiatric:        Behavior: Behavior normal.        Thought Content: Thought content normal.        Judgment: Judgment normal.       BP 109/66    Pulse 87    Temp (!) 97.2 F (36.2 C) (Temporal)    Ht 5' 10"  (1.778 m)    Wt 240 lb 6.4 oz (109 kg)    BMI 34.49 kg/m   Assessment & Plan:  Gregory Carey comes in today with chief complaint of Medical Management of Chronic Issues   Diagnosis and orders addressed:  1. Essential  hypertension - CMP14+EGFR - CBC with Differential/Platelet  2. Malignant neoplasm of base of tongue (HCC) - CMP14+EGFR - CBC with Differential/Platelet  3. Type 2 diabetes mellitus with hyperglycemia, with long-term current use of insulin (HCC) - Bayer DCA Hb A1c Waived - CMP14+EGFR - CBC with Differential/Platelet  4. Morbid obesity ([place]) - CMP14+EGFR - CBC with Differential/Platelet  5. Hyperlipidemia, unspecified hyperlipidemia type - CMP14+EGFR - CBC with Differential/Platelet   Labs pending Health Maintenance reviewed Diet and exercise encouraged  Follow up plan: 6 months    Evelina Dun, FNP

## 2021-01-30 LAB — CMP14+EGFR
ALT: 26 IU/L (ref 0–44)
AST: 22 IU/L (ref 0–40)
Albumin/Globulin Ratio: 2.8 — ABNORMAL HIGH (ref 1.2–2.2)
Albumin: 4.8 g/dL (ref 3.8–4.9)
Alkaline Phosphatase: 65 IU/L (ref 44–121)
BUN/Creatinine Ratio: 15 (ref 9–20)
BUN: 16 mg/dL (ref 6–24)
Bilirubin Total: 0.8 mg/dL (ref 0.0–1.2)
CO2: 28 mmol/L (ref 20–29)
Calcium: 10.1 mg/dL (ref 8.7–10.2)
Chloride: 99 mmol/L (ref 96–106)
Creatinine, Ser: 1.05 mg/dL (ref 0.76–1.27)
Globulin, Total: 1.7 g/dL (ref 1.5–4.5)
Glucose: 99 mg/dL (ref 70–99)
Potassium: 4 mmol/L (ref 3.5–5.2)
Sodium: 144 mmol/L (ref 134–144)
Total Protein: 6.5 g/dL (ref 6.0–8.5)
eGFR: 82 mL/min/{1.73_m2} (ref >59)

## 2021-01-30 LAB — CBC WITH DIFFERENTIAL/PLATELET
Basophils Absolute: 0 10*3/uL (ref 0.0–0.2)
Basos: 1 %
EOS (ABSOLUTE): 0.2 10*3/uL (ref 0.0–0.4)
Eos: 3 %
Hematocrit: 43.9 % (ref 37.5–51.0)
Hemoglobin: 15.2 g/dL (ref 13.0–17.7)
Immature Grans (Abs): 0 10*3/uL (ref 0.0–0.1)
Immature Granulocytes: 0 %
Lymphocytes Absolute: 0.6 10*3/uL — ABNORMAL LOW (ref 0.7–3.1)
Lymphs: 10 %
MCH: 29.7 pg (ref 26.6–33.0)
MCHC: 34.6 g/dL (ref 31.5–35.7)
MCV: 86 fL (ref 79–97)
Monocytes Absolute: 0.6 10*3/uL (ref 0.1–0.9)
Monocytes: 10 %
Neutrophils Absolute: 4.6 10*3/uL (ref 1.4–7.0)
Neutrophils: 76 %
Platelets: 252 10*3/uL (ref 150–450)
RBC: 5.12 x10E6/uL (ref 4.14–5.80)
RDW: 13.4 % (ref 11.6–15.4)
WBC: 6 10*3/uL (ref 3.4–10.8)

## 2021-01-31 DIAGNOSIS — C109 Malignant neoplasm of oropharynx, unspecified: Secondary | ICD-10-CM | POA: Diagnosis not present

## 2021-01-31 DIAGNOSIS — Z923 Personal history of irradiation: Secondary | ICD-10-CM | POA: Diagnosis not present

## 2021-02-03 ENCOUNTER — Other Ambulatory Visit: Payer: Self-pay

## 2021-02-03 ENCOUNTER — Ambulatory Visit: Payer: BC Managed Care – PPO | Attending: Radiation Oncology

## 2021-02-03 DIAGNOSIS — Z483 Aftercare following surgery for neoplasm: Secondary | ICD-10-CM | POA: Insufficient documentation

## 2021-02-03 DIAGNOSIS — R293 Abnormal posture: Secondary | ICD-10-CM | POA: Insufficient documentation

## 2021-02-03 DIAGNOSIS — C01 Malignant neoplasm of base of tongue: Secondary | ICD-10-CM | POA: Diagnosis not present

## 2021-02-03 DIAGNOSIS — I890 Lymphedema, not elsewhere classified: Secondary | ICD-10-CM | POA: Insufficient documentation

## 2021-02-03 NOTE — Therapy (Signed)
[facility] @ [place], [place], [address] Phone: [phone]   Fax:  [phone]  Physical Therapy Treatment  Patient Details  Name: Gregory Carey MRN: [id number] Date of Birth: 06-26-1960 Referring Provider (PT): Isidore Moos   Encounter Date: 02/03/2021   PT End of Session - 02/03/21 1629     Visit Number 7    Number of Visits 17    Date for PT Re-Evaluation 03/03/21    PT Start Time 1606    PT Stop Time 1700    PT Time Calculation (min) 54 min    Activity Tolerance Patient tolerated treatment well    Behavior During Therapy [facility] for tasks assessed/performed             Past Medical History:  Diagnosis Date   Cancer ([place])    neck   Complication of anesthesia    Diabetes mellitus without complication ([place])    Difficult intubation    Was infomed by MD Anesth his airway was small   Hyperlipidemia    Hypertension    Localized osteoarthritis of left shoulder    Osteoarthritis of left shoulder 09/29/2018   Sleep apnea    uses CPAP    Past Surgical History:  Procedure Laterality Date   BIOPSY  04/23/2020   Procedure: BIOPSY;  Surgeon: Eloise Harman, DO;  [facility];  Service: Endoscopy;;   COLONOSCOPY WITH PROPOFOL N/A 04/23/2020   Procedure: COLONOSCOPY WITH PROPOFOL;  Surgeon: Eloise Harman, DO;  [facility];  Service: Endoscopy;  Laterality: N/A;  am appt   DIRECT LARYNGOSCOPY N/A 10/23/2020   Procedure: DIRECT LARYNGOSCOPY WITH BIOPSIES;  Surgeon: Izora Gala, MD;  Location: [facility];  Service: ENT;  Laterality: N/A;   ESOPHAGOSCOPY N/A 10/23/2020   Procedure: ESOPHAGOSCOPY;  Surgeon: Izora Gala, MD;  Location: [facility];  Service: ENT;  Laterality: N/A;   KNEE ARTHROSCOPY Right 02/16/2006   POLYPECTOMY  04/23/2020   Procedure: POLYPECTOMY;  Surgeon: Eloise Harman, DO;  [facility];  Service: Endoscopy;;   RADICAL NECK DISSECTION Left 10/23/2020    Procedure: LEFT NECK DISSECTION;  Surgeon: Izora Gala, MD;  Location: [facility];  Service: ENT;  Laterality: Left;   SHOULDER ARTHROSCOPY Right 02/17/2000   SKIN CANCER EXCISION     Multiple basal cell and squmous cell leisons removed   TOTAL SHOULDER ARTHROPLASTY Left 09/29/2018   Procedure: TOTAL SHOULDER ARTHROPLASTY;  Surgeon: Nicholes Stairs, MD;  Location: [facility];  Service: Orthopedics;  Laterality: Left;  2.5 hrs    There were no vitals filed for this visit.   Subjective Assessment - 02/03/21 1609     Subjective I finished radiation on 01/16/21. They gave me silvadene which we used alot and that has helped my skin heal well. My lymphedema doesn't feel quite as bad as it did before and my neck ROM is slowly improving. I just feel some tightness on the inside of the Lt>Rt side of the front of my neck. My ENT said he's very pleased with how supple my skin is. I'm putting vitamin E oil on my skin now.    Pertinent History Metastatic SCC to lymph node and positive Base of Tongue SCC Stage 1 (T1, N1, M0, p 16 +), 08/30/20 Biopsy of left submandibular lymph node revealed poorly differentiated carcinoma, 7/25 PET revealing hypermetabolic left-sided level II cervical lymphadenopathy. PET also demonstrated relatively symmetric hypermetabolic FDG uptake identified in the anterior hypopharynx,  and no discrete oro pharyngeal hypermetabolic lesion evident. Otherwise, no evidence of hypermetabolic metastatic disease was visualized in the chest, abdomen, or pelvis, 09/11/20 CT neck revealing two pathologic left level 2 lymph nodes as seen on prior PET-CT. No primary lesion or contralateral metastatic node was visualized, 10/23/20 patient underwent direct laryngoscopy and neck dissection by Dr. Constance Holster with biopsies taken from the L BOT and L Tonsil. L BOT revealed invasive moderate to poorly differentiated SCC and the L Tonsil was negative, will receive 35 fractions of radiation to his BOT and bilateral neck. He had  CT simulation today and will start 10/13, L shoulder replacement 10 yrs ago, pt reports he needs both knees replaced as well, diabetes    Patient Stated Goals to gain info from providers    Currently in Pain? No/denies   just some tenderness with swallowing               [facility] PT Assessment - 02/03/21 0001       AROM   Cervical Flexion WFL    Cervical Extension 10% limited    Cervical - Right Side Bend WFL    Cervical - Left Side Bend 25% limited    Cervical - Right Rotation WFL    Cervical - Left Rotation [facility]                           [facility] Adult PT Treatment/Exercise - 02/03/21 0001       Self-Care   Self-Care Other Self-Care Comments    Other Self-Care Comments  Discussed pts current functional status since completing radiation. He does not wish to pursue radiation at this time due to his lymphedema is much improved since completing radiation and is also not going to pursue a head/neck compression garment for same reason. Reassessed goals and pt would like to resume physical therapy as his skin is much improved and he can tolerate manual therapy again.      Manual Therapy   Manual Therapy Soft tissue mobilization;Passive ROM    Soft tissue mobilization With cocoa butter to Lt>Rt cervical area and at anterior throat, avoided area where blisters are still present, though they are healing well    Passive ROM In Supine into bil cervical rotation and side bending, suboccipital release; also gentle traction                          PT Long Term Goals - 02/03/21 1614       PT LONG TERM GOAL #1   Title Pt will be independent in self MLD for long term management of lymphedema.    Baseline Pt is independent with self MLD - 02/03/21    Status Achieved      PT LONG TERM GOAL #2   Title Pt will demonstrate full cervical ROM in all directions to allow pt to return to PLOF.    Baseline R lateral flexion and extension are limited; Lt side bend ~25%  and extension ~10% limited since completing radiation - 02/03/21    Status On-going      PT LONG TERM GOAL #3   Title Pt will receive trial of FlexiTouch compression pump for long term management of lymphedema.    Baseline Pt has decided not to puruse this at this time as his swelling is improving since ending radiation but knows how to get this if he changes his mind - 02/03/21  Status Achieved      PT LONG TERM GOAL #4   Title Pt will obtain appropriate compression garment for long term management of head and neck lymphedema.    Baseline Also decided not to pursue this as his neck is improving - 02/03/21    Status Achieved                   Plan - 02/03/21 1705     Clinical Impression Statement Pt returns to physical therapy after having completed his radiation 01/16/21. Overall his skin integrity is much improved and has received good reports from his rad onc and ENT. He has a small area inferior to his TMJ that has healing blisters present. These are scabbed over but did not apply any STM here. This is one of the areas that he feels is the most tender though so did work on stretching as he did not feel a pull at his skin and tolerated that well. Renewal done today for 2x/wk x4 more weeks though pt may be ready to reduce freq after a few more sessions due to him feeling so well. His cervical A/ROM is some limited (see goals) with Lt side bend and extension but without pain, just tightness. He will benefit from continued therapiy at this time to resume manual therapy to work on decreasing radiation fibrosis at [place] anterior throat as able and improve cervical A/ROM to [facility]. Pt is agreeable to this.    Stability/Clinical Decision Making Stable/Uncomplicated    Rehab Potential Good    PT Frequency 1x / week    PT Duration 8 weeks    PT Treatment/Interventions ADLs/Self Care Home Management;Therapeutic exercise;Patient/family education;Manual techniques;Manual lymph drainage;Compression  bandaging;Scar mobilization;Passive range of motion;Taping;Vasopneumatic Device    PT Next Visit Plan Renewed today for 2x/wk x 4 more weeks; any increase lymphedema sympotms after today? cont manual therapy to Lt>Rt cervical area including STM and P/ROM.    PT Home Exercise Plan head and neck ROM exercises, wear chip pack, self MLD prn    Consulted and Agree with Plan of Care Patient             Patient will benefit from skilled therapeutic intervention in order to improve the following deficits and impairments:  Postural dysfunction, Impaired flexibility, Increased fascial restricitons, Decreased knowledge of precautions, Decreased range of motion, Decreased scar mobility, Increased edema  Visit Diagnosis: Lymphedema, not elsewhere classified  Aftercare following surgery for neoplasm  Abnormal posture  Malignant neoplasm of base of tongue ([place])     Problem List Patient Active Problem List   Diagnosis Date Noted   Malignant neoplasm of base of tongue ([place]) 11/01/2020   Metastatic cancer to cervical lymph nodes ([place]) 10/23/2020   Metastatic squamous cell carcinoma to lymph node ([facility]) 09/13/2020   Osteoarthritis, knee 08/29/2019   OSA (obstructive sleep apnea) 02/26/2019   Osteoarthritis of left shoulder 09/29/2018   History of left shoulder replacement 09/29/2018   Diabetes mellitus, type 2 ([place]) 07/29/2015   Essential hypertension 04/29/2015   Hyperlipidemia 04/29/2015   Morbid obesity ([place]) 04/29/2015    Otelia Limes, PTA 02/03/2021, 5:13 PM  [facility] @ [place], [place], [address] Phone: [phone]   Fax:  [phone]  Name: Gregory Carey MRN: [id number] Date of Birth: October 27, 1960

## 2021-02-04 NOTE — Progress Notes (Signed)
Gregory Carey presents for follow up after completing radiation to oropharynx, patient  completed treatment on 01/16/21   Pain issues, if any: 2 oropharynx Using a feeding tube?: N/A Weight changes, if any: Yes 5 to 6 lbs in one month Swallowing issues, if any: Very mild difficulty. Water helps. Smoking or chewing tobacco? No Using fluoride trays daily? No but doing salt-water/ baking soda rinses.  Last ENT visit was on: 2 wks ago Other notable issues, if any: N/A

## 2021-02-04 NOTE — Addendum Note (Signed)
Addended by: Manus Gunning L on: 02/04/2021 09:10 AM   Modules accepted: Orders

## 2021-02-05 ENCOUNTER — Encounter: Payer: BC Managed Care – PPO | Admitting: Physical Therapy

## 2021-02-05 ENCOUNTER — Other Ambulatory Visit: Payer: Self-pay

## 2021-02-05 ENCOUNTER — Encounter: Payer: Self-pay | Admitting: Radiation Oncology

## 2021-02-05 ENCOUNTER — Inpatient Hospital Stay: Payer: BC Managed Care – PPO | Attending: Hematology and Oncology | Admitting: Dietician

## 2021-02-05 ENCOUNTER — Telehealth: Payer: Self-pay

## 2021-02-05 ENCOUNTER — Ambulatory Visit
Admission: RE | Admit: 2021-02-05 | Discharge: 2021-02-05 | Disposition: A | Discharge status: Home / Self Care | Payer: BC Managed Care – PPO | Source: Ambulatory Visit | Attending: Radiation Oncology | Admitting: Radiation Oncology

## 2021-02-05 VITALS — BP 112/77 | HR 65 | Temp 97.8°F | Resp 20 | Ht 70.0 in | Wt 239.6 lb

## 2021-02-05 DIAGNOSIS — C01 Malignant neoplasm of base of tongue: Secondary | ICD-10-CM | POA: Diagnosis not present

## 2021-02-05 NOTE — Telephone Encounter (Signed)
Mr. Gregory Carey was in for follow-up visit with Dr. Isidore Moos.  Requested letter to return to work which was typed and Dr. Isidore Moos signed.  Patient and wife was appreciative of hospitality of staff.  Nothing else follows.

## 2021-02-05 NOTE — Progress Notes (Signed)
Nutrition Follow-up:  Patient completed radiation therapy for base of tongue cancer on 12/1.  Met with patient and wife in clinic. He reports doing "really well." His taste has come back and is eating a variety of foods. Patient reports dysphagia with breads. He has met with SLP and PT in the past week. Patient is completed HEP exercises. He denies nausea, vomiting, diarrhea, constipation. Patient reports he tires more quickly, but feels ready to go back to work.     Medications: reviewed   Labs: reviewed   Anthropometrics: Weight 239 lb 9.6 oz today stable  12/14 - 240 lb 6.4 oz 11/29 - 249.8 lb    NUTRITION DIAGNOSIS: Inadequate oral intake resolved    INTERVENTION:  Continue eating high calorie high protein foods for weight maintenance     MONITORING, EVALUATION, GOAL: weight trends, intake   NEXT VISIT: No follow-up scheduled at this time, pt encouraged to contact as needed

## 2021-02-05 NOTE — Progress Notes (Signed)
Radiation Oncology         [phone]) [phone] ________________________________  Name: Gregory Carey MRN: [id number]  Date: 02/05/2021  DOB: [birth year]  Follow-Up Visit Note  CC: Sharion Balloon, FNP  Izora Gala, MD  Diagnosis and Prior Radiotherapy:       ICD-10-CM   1. Malignant neoplasm of base of tongue ([place])  C01       Cancer Staging  Metastatic squamous cell carcinoma to lymph node ([place]) Staging form: Pharynx - HPV-Mediated Oropharynx, AJCC 8th Edition - Clinical stage from 11/01/2020: Stage I (cT1, cN1, cM0, p16+) - Signed by Eppie Gibson, MD on 11/01/2020 Stage prefix: Initial diagnosis   CHIEF COMPLAINT:  Here for follow-up and surveillance of throat cancer  Narrative:  The patient returns today for routine follow-up.  Gregory Carey presents for follow up after completing radiation to oropharynx, patient  completed treatment on 01/16/21  Doing really well  Pain issues, if any: 2 oropharynx Using a feeding tube?: N/A Weight changes, if any: Yes 5 to 6 lbs in one month Swallowing issues, if any: Very mild difficulty. Water helps. Smoking or chewing tobacco? No Using fluoride trays daily? No but doing salt-water/ baking soda rinses.  Last ENT visit was on: 2 wks ago Other notable issues, if any: N/A                        ALLERGIES:  has No Known Allergies.  Meds: Current Outpatient Medications  Medication Sig Dispense Refill   amLODipine (NORVASC) 5 MG tablet Take 1 tablet (5 mg total) by mouth daily. 90 tablet 3   aspirin EC 81 MG tablet Take 1 tablet (81 mg total) by mouth daily. 90 tablet 1   blood glucose meter kit and supplies Dispense based on patient and insurance preference. Use up to four times daily as directed. (FOR ICD-9 250.00, 250.01). 1 each 0   busPIRone (BUSPAR) 5 MG tablet Take 1 tablet (5 mg total) by mouth 3 (three) times daily as needed. 90 tablet 1   CONTOUR NEXT TEST test strip USE AS DIRECTED TWICE DAILY 100 each 0   lidocaine (XYLOCAINE) 2  % solution Patient: Mix 1part 2% viscous lidocaine, 1part H20. Swallow 100m of diluted mixture, 367m before meals and at bedtime, up to QID (Patient not taking: Reported on 01/29/2021) 200 mL 3   lisinopril-hydrochlorothiazide (ZESTORETIC) 20-12.5 MG tablet Take 2 tablets by mouth daily. 180 tablet 2   metFORMIN (GLUCOPHAGE-XR) 750 MG 24 hr tablet Take 1 tablet by mouth once daily with breakfast 90 tablet 0   ondansetron (ZOFRAN ODT) 8 MG disintegrating tablet Take 1 tablet (8 mg total) by mouth every 8 (eight) hours as needed for nausea or vomiting. 20 tablet 1   rosuvastatin (CRESTOR) 20 MG tablet Take 1 tablet (20 mg total) by mouth daily. 90 tablet 3   TRULICITY 0.7.62GGB/1.5VVOPN INJECT 1 DOSE (0.75MG TOTAL) SUBCUTANEOUSLY ONCE A WEEK 4 mL 0   No current facility-administered medications for this encounter.    Physical Findings: The patient is in no acute distress. Patient is alert and oriented. Wt Readings from Last 3 Encounters:  02/05/21 239 lb 9.6 oz (108.7 kg)  01/29/21 240 lb 6.4 oz (109 kg)  11/20/20 269 lb (122 kg)    height is 5' 10"  (1.778 m) and weight is 239 lb 9.6 oz (108.7 kg). His temporal temperature is 97.8 F (36.6 C). His blood pressure is 112/77 and his pulse is  65. His respiration is 20 and oxygen saturation is 97%. .  General: Alert and oriented, in no acute distress HEENT: Head is normocephalic. Extraocular movements are intact. Oropharynx is notable for no lesions Neck: Neck is notable for no masses Skin: Skin in treatment fields shows satisfactory healing  Lymphatics: see Neck Exam Psychiatric: Judgment and insight are intact. Affect is appropriate.   Lab Findings: Lab Results  Component Value Date   WBC 6.0 01/29/2021   HGB 15.2 01/29/2021   HCT 43.9 01/29/2021   MCV 86 01/29/2021   PLT 252 01/29/2021    Lab Results  Component Value Date   TSH 0.538 11/20/2020    Radiographic Findings: No results found.  Impression/Plan:    1) Head and  Neck Cancer Status: healing well from RT  2) Nutritional Status:  Wt Readings from Last 3 Encounters:  02/05/21 239 lb 9.6 oz (108.7 kg)  01/29/21 240 lb 6.4 oz (109 kg)  11/20/20 269 lb (122 kg)  Stabilizing  3) PEG tube?: none  4) Swallowing: good function, continues slp exercises  5) Dental: Encouraged to continue regular followup with dentistry, and dental hygiene including fluoride rinses. Referring back to DR [facility]  6) Thyroid function: check annually Lab Results  Component Value Date   TSH 0.538 11/20/2020    7) Other: f/u in March with CT restaging. Letter written to allow return to work. The patient was encouraged to call with any issues or questions before then.  On date of service, in total, I spent 25 minutes on this encounter. Patient was seen in person. _____________________________________   Eppie Gibson, MD

## 2021-02-06 ENCOUNTER — Telehealth: Payer: Self-pay

## 2021-02-06 NOTE — Telephone Encounter (Signed)
Notified Patient of completion of Long Term Disability Form. Request for Medical Records forwarded to [facility] [employer] with signed Release of Information Form. Copy of Forms mailed to patient as requested.Patient advised that release of medical records may take up to 30 days. Understanding verbalized. No other needs voiced at this time.

## 2021-02-07 NOTE — Progress Notes (Signed)
° °                                                                                                                                                          °  Patient Name: Gregory Carey MRN: [id number] DOB: [birth year] Referring Physician: Izora Gala (Profile Not Attached) Date of Service: 01/16/2021 [facility], [place]                                                        End Of Treatment Note  Diagnoses: C77.0-Secondary and unspecified malignant neoplasm of lymph nodes of head, face and neck  Cancer Staging:  Cancer Staging  Metastatic squamous cell carcinoma to lymph node ([place]) Staging form: Pharynx - HPV-Mediated Oropharynx, AJCC 8th Edition - Clinical stage from 11/01/2020: Stage I (cT1, cN1, cM0, p16+) - Signed by Eppie Gibson, MD on 11/01/2020 Stage prefix: Initial diagnosis   Intent: Curative  Radiation Treatment Dates: 11/28/2020 through 01/16/2021 Site Technique Total Dose (Gy) Dose per Fx (Gy) Completed Fx Beam Energies  Oropharynx: HN_BOT IMRT 70/70 2 35/35 6X   Narrative: The patient tolerated radiation therapy relatively well.   Plan: The patient will follow-up with radiation oncology in 2-3 wks.   -----------------------------------  Eppie Gibson, MD

## 2021-02-11 ENCOUNTER — Other Ambulatory Visit: Payer: Self-pay | Admitting: Family

## 2021-02-11 DIAGNOSIS — Z794 Long term (current) use of insulin: Secondary | ICD-10-CM

## 2021-02-12 ENCOUNTER — Ambulatory Visit: Payer: BC Managed Care – PPO

## 2021-02-12 ENCOUNTER — Other Ambulatory Visit: Payer: Self-pay

## 2021-02-12 DIAGNOSIS — R293 Abnormal posture: Secondary | ICD-10-CM

## 2021-02-12 DIAGNOSIS — C01 Malignant neoplasm of base of tongue: Secondary | ICD-10-CM

## 2021-02-12 DIAGNOSIS — Z483 Aftercare following surgery for neoplasm: Secondary | ICD-10-CM | POA: Diagnosis not present

## 2021-02-12 DIAGNOSIS — I890 Lymphedema, not elsewhere classified: Secondary | ICD-10-CM | POA: Diagnosis not present

## 2021-02-12 NOTE — Therapy (Signed)
[place] @ [address] [place], [place], [address] Phone: [phone]   Fax:  [phone]  Physical Therapy Treatment  Patient Details  Name: Gregory Carey MRN: [id number] Date of Birth: 1961/01/11 Referring Provider (PT): Isidore Moos   Encounter Date: 02/12/2021   PT End of Session - 02/12/21 1106     Visit Number 8    Number of Visits 17    Date for PT Re-Evaluation 03/03/21    PT Start Time 1006    PT Stop Time 1101    PT Time Calculation (min) 55 min    Activity Tolerance Patient tolerated treatment well    Behavior During Therapy [facility] for tasks assessed/performed             Past Medical History:  Diagnosis Date   Cancer ([place])    neck   Complication of anesthesia    Diabetes mellitus without complication ([place])    Difficult intubation    Was infomed by MD Anesth his airway was small   Hyperlipidemia    Hypertension    Localized osteoarthritis of left shoulder    Osteoarthritis of left shoulder 09/29/2018   Sleep apnea    uses CPAP    Past Surgical History:  Procedure Laterality Date   BIOPSY  04/23/2020   Procedure: BIOPSY;  Surgeon: Eloise Harman, DO;  [facility];  Service: Endoscopy;;   COLONOSCOPY WITH PROPOFOL N/A 04/23/2020   Procedure: COLONOSCOPY WITH PROPOFOL;  Surgeon: Eloise Harman, DO;  [facility];  Service: Endoscopy;  Laterality: N/A;  am appt   DIRECT LARYNGOSCOPY N/A 10/23/2020   Procedure: DIRECT LARYNGOSCOPY WITH BIOPSIES;  Surgeon: Izora Gala, MD;  Location: [place];  Service: ENT;  Laterality: N/A;   ESOPHAGOSCOPY N/A 10/23/2020   Procedure: ESOPHAGOSCOPY;  Surgeon: Izora Gala, MD;  Location: [place];  Service: ENT;  Laterality: N/A;   KNEE ARTHROSCOPY Right 02/16/2006   POLYPECTOMY  04/23/2020   Procedure: POLYPECTOMY;  Surgeon: Eloise Harman, DO;  [facility];  Service: Endoscopy;;   RADICAL NECK DISSECTION Left 10/23/2020    Procedure: LEFT NECK DISSECTION;  Surgeon: Izora Gala, MD;  Location: [place];  Service: ENT;  Laterality: Left;   SHOULDER ARTHROSCOPY Right 02/17/2000   SKIN CANCER EXCISION     Multiple basal cell and squmous cell leisons removed   TOTAL SHOULDER ARTHROPLASTY Left 09/29/2018   Procedure: TOTAL SHOULDER ARTHROPLASTY;  Surgeon: Nicholes Stairs, MD;  Location: [place];  Service: Orthopedics;  Laterality: Left;  2.5 hrs    There were no vitals filed for this visit.   Subjective Assessment - 02/12/21 1008     Subjective I think the lymphedema is doing much better. I've only had one morning that I woke up with a little increased edema but that went down during the day. The tightness seems a little worse at the left side this week.    Pertinent History Metastatic SCC to lymph node and positive Base of Tongue SCC Stage 1 (T1, N1, M0, p 16 +), 08/30/20 Biopsy of left submandibular lymph node revealed poorly differentiated carcinoma, 7/25 PET revealing hypermetabolic left-sided level II cervical lymphadenopathy. PET also demonstrated relatively symmetric hypermetabolic FDG uptake identified in the anterior hypopharynx, and no discrete oro pharyngeal hypermetabolic lesion evident. Otherwise, no evidence of hypermetabolic metastatic disease was visualized in the chest, abdomen, or pelvis, 09/11/20 CT neck revealing two pathologic left level 2 lymph nodes as seen on  prior PET-CT. No primary lesion or contralateral metastatic node was visualized, 10/23/20 patient underwent direct laryngoscopy and neck dissection by Dr. Constance Holster with biopsies taken from the L BOT and L Tonsil. L BOT revealed invasive moderate to poorly differentiated SCC and the L Tonsil was negative, will receive 35 fractions of radiation to his BOT and bilateral neck. He had CT simulation today and will start 10/13, L shoulder replacement 10 yrs ago, pt reports he needs both knees replaced as well, diabetes    Patient Stated Goals to gain info from  providers    Currently in Pain? No/denies                               [facility] Adult PT Treatment/Exercise - 02/12/21 0001       Exercises   Other Exercises  Briefly instructed pt at end of session with him returning demo for each in chin tuck, and then same in 45 degree and 90 degrees of rotation bil, also "kiss the sky" exercise protracting mandible and add gentle skin stretch over clavicle for MFR to Lt side of neck      Manual Therapy   Soft tissue mobilization With cocoa butter to Lt>Rt cervical area and at anterior throat, was able to work entire Lt side of neck today but used light pressure and was mindful to try to avoid prolonged pressure where skin still healing, mostly around Lt SCM insertion    Passive ROM In Supine into bil cervical rotation and side bending, suboccipital release; also gentle traction                     PT Education - 02/12/21 1100     Education Details Cervical strengthening exercises    Person(s) Educated Patient;Spouse    Methods Explanation;Demonstration;Handout    Comprehension Verbalized understanding;Returned demonstration                 PT Long Term Goals - 02/03/21 1614       PT LONG TERM GOAL #1   Title Pt will be independent in self MLD for long term management of lymphedema.    Baseline Pt is independent with self MLD - 02/03/21    Status Achieved      PT LONG TERM GOAL #2   Title Pt will demonstrate full cervical ROM in all directions to allow pt to return to PLOF.    Baseline R lateral flexion and extension are limited; Lt side bend ~25% and extension ~10% limited since completing radiation - 02/03/21    Status On-going      PT LONG TERM GOAL #3   Title Pt will receive trial of FlexiTouch compression pump for long term management of lymphedema.    Baseline Pt has decided not to puruse this at this time as his swelling is improving since ending radiation but knows how to get this if he changes  his mind - 02/03/21    Status Achieved      PT LONG TERM GOAL #4   Title Pt will obtain appropriate compression garment for long term management of head and neck lymphedema.    Baseline Also decided not to pursue this as his neck is improving - 02/03/21    Status Achieved                   Plan - 02/12/21 1716     Clinical Impression Statement Pt  reports noting his lymphedema symptoms mostly gone at this time. He did report feelings of increased fascial tightness at Lt lateral to anterior throat so focused manual therapy here today. Continued with use of cocoa butter to decrease friction over healing skin though this appeard much better today with less redness and less dryness/peeling. He reports tendnerness much improved at [facility] insertion as well since last week. Still was mindful of avoiding prolonged deep tissue pressure at same area as he is still healing from radiation. Progressed HEP to include cervical strengthening with isomertics today as well.    Stability/Clinical Decision Making Stable/Uncomplicated    Rehab Potential Good    PT Frequency 2x / week    PT Duration 4 weeks    PT Treatment/Interventions ADLs/Self Care Home Management;Therapeutic exercise;Patient/family education;Manual techniques;Manual lymph drainage;Compression bandaging;Scar mobilization;Passive range of motion;Taping;Vasopneumatic Device    PT Next Visit Plan Cont manual therapy to Lt>Rt cervical area including STM and P/ROM and progress cervical strength/stability HEP prn    PT Home Exercise Plan head and neck ROM exercises, wear chip pack, self MLD prn; cervical strengthening    Consulted and Agree with Plan of Care Patient    Family Member Consulted spouse             Patient will benefit from skilled therapeutic intervention in order to improve the following deficits and impairments:  Postural dysfunction, Impaired flexibility, Increased fascial restricitons, Decreased knowledge of  precautions, Decreased range of motion, Decreased scar mobility, Increased edema  Visit Diagnosis: Lymphedema, not elsewhere classified  Aftercare following surgery for neoplasm  Abnormal posture  Malignant neoplasm of base of tongue ([place])     Problem List Patient Active Problem List   Diagnosis Date Noted   Malignant neoplasm of base of tongue ([place]) 11/01/2020   Metastatic cancer to cervical lymph nodes ([place]) 10/23/2020   Metastatic squamous cell carcinoma to lymph node ([place]) 09/13/2020   Osteoarthritis, knee 08/29/2019   OSA (obstructive sleep apnea) 02/26/2019   Osteoarthritis of left shoulder 09/29/2018   History of left shoulder replacement 09/29/2018   Diabetes mellitus, type 2 ([place]) 07/29/2015   Essential hypertension 04/29/2015   Hyperlipidemia 04/29/2015   Morbid obesity ([place]) 04/29/2015    Otelia Limes, PTA 02/12/2021, 5:23 PM  [place] @ [place], [place], [address] Phone: [phone]   Fax:  [phone]  Name: DEMYAN FUGATE MRN: [id number] Date of Birth: 1960/04/11

## 2021-02-13 ENCOUNTER — Ambulatory Visit: Payer: BC Managed Care – PPO

## 2021-02-13 DIAGNOSIS — C01 Malignant neoplasm of base of tongue: Secondary | ICD-10-CM

## 2021-02-13 DIAGNOSIS — I890 Lymphedema, not elsewhere classified: Secondary | ICD-10-CM

## 2021-02-13 DIAGNOSIS — R293 Abnormal posture: Secondary | ICD-10-CM | POA: Diagnosis not present

## 2021-02-13 DIAGNOSIS — Z483 Aftercare following surgery for neoplasm: Secondary | ICD-10-CM

## 2021-02-13 NOTE — Therapy (Signed)
[place] @ [place] [address] [place], [place], [address] Phone: [phone]   Fax:  [phone]  Physical Therapy Treatment  Patient Details  Name: Gregory Carey MRN: [id number] Date of Birth: 1960-09-21 Referring Provider (PT): Isidore Moos   Encounter Date: 02/13/2021   PT End of Session - 02/13/21 1003     Visit Number 9    Number of Visits 17    Date for PT Re-Evaluation 03/03/21    PT Start Time 0906    PT Stop Time 0959    PT Time Calculation (min) 53 min    Activity Tolerance Patient tolerated treatment well    Behavior During Therapy [facility] for tasks assessed/performed             Past Medical History:  Diagnosis Date   Cancer ([place])    neck   Complication of anesthesia    Diabetes mellitus without complication ([place])    Difficult intubation    Was infomed by MD Anesth his airway was small   Hyperlipidemia    Hypertension    Localized osteoarthritis of left shoulder    Osteoarthritis of left shoulder 09/29/2018   Sleep apnea    uses CPAP    Past Surgical History:  Procedure Laterality Date   BIOPSY  04/23/2020   Procedure: BIOPSY;  Surgeon: Eloise Harman, DO;  [facility];  Service: Endoscopy;;   COLONOSCOPY WITH PROPOFOL N/A 04/23/2020   Procedure: COLONOSCOPY WITH PROPOFOL;  Surgeon: Eloise Harman, DO;  [facility];  Service: Endoscopy;  Laterality: N/A;  am appt   DIRECT LARYNGOSCOPY N/A 10/23/2020   Procedure: DIRECT LARYNGOSCOPY WITH BIOPSIES;  Surgeon: Izora Gala, MD;  Location: [place];  Service: ENT;  Laterality: N/A;   ESOPHAGOSCOPY N/A 10/23/2020   Procedure: ESOPHAGOSCOPY;  Surgeon: Izora Gala, MD;  Location: [place];  Service: ENT;  Laterality: N/A;   KNEE ARTHROSCOPY Right 02/16/2006   POLYPECTOMY  04/23/2020   Procedure: POLYPECTOMY;  Surgeon: Eloise Harman, DO;  [facility];  Service: Endoscopy;;   RADICAL NECK DISSECTION Left 10/23/2020    Procedure: LEFT NECK DISSECTION;  Surgeon: Izora Gala, MD;  Location: [place];  Service: ENT;  Laterality: Left;   SHOULDER ARTHROSCOPY Right 02/17/2000   SKIN CANCER EXCISION     Multiple basal cell and squmous cell leisons removed   TOTAL SHOULDER ARTHROPLASTY Left 09/29/2018   Procedure: TOTAL SHOULDER ARTHROPLASTY;  Surgeon: Nicholes Stairs, MD;  Location: [place];  Service: Orthopedics;  Laterality: Left;  2.5 hrs    There were no vitals filed for this visit.   Subjective Assessment - 02/13/21 0911     Subjective I feel pretty good after yesterday. I can just tell I'm a little tender at the side of my neck where you worked yesterday.    Pertinent History Metastatic SCC to lymph node and positive Base of Tongue SCC Stage 1 (T1, N1, M0, p 16 +), 08/30/20 Biopsy of left submandibular lymph node revealed poorly differentiated carcinoma, 7/25 PET revealing hypermetabolic left-sided level II cervical lymphadenopathy. PET also demonstrated relatively symmetric hypermetabolic FDG uptake identified in the anterior hypopharynx, and no discrete oro pharyngeal hypermetabolic lesion evident. Otherwise, no evidence of hypermetabolic metastatic disease was visualized in the chest, abdomen, or pelvis, 09/11/20 CT neck revealing two pathologic left level 2 lymph nodes as seen on prior PET-CT. No primary lesion or contralateral metastatic node was visualized, 10/23/20 patient underwent direct laryngoscopy and  neck dissection by Dr. Constance Holster with biopsies taken from the L BOT and L Tonsil. L BOT revealed invasive moderate to poorly differentiated SCC and the L Tonsil was negative, will receive 35 fractions of radiation to his BOT and bilateral neck. He had CT simulation today and will start 10/13, L shoulder replacement 10 yrs ago, pt reports he needs both knees replaced as well, diabetes    Patient Stated Goals to gain info from providers    Currently in Pain? No/denies                                [facility] Adult PT Treatment/Exercise - 02/13/21 0001       Manual Therapy   Soft tissue mobilization With cocoa butter to Lt>Rt cervical area and at Lt anterior throat, was able to work entire Lt side of neck today but used light pressure and was mindful to try to avoid prolonged pressure where skin still healing, mostly around Lt SCM insertion    Passive ROM In Supine into bil cervical rotation and side bending with and without cervical depression, suboccipital release; also gentle traction                          PT Long Term Goals - 02/03/21 1614       PT LONG TERM GOAL #1   Title Pt will be independent in self MLD for long term management of lymphedema.    Baseline Pt is independent with self MLD - 02/03/21    Status Achieved      PT LONG TERM GOAL #2   Title Pt will demonstrate full cervical ROM in all directions to allow pt to return to PLOF.    Baseline R lateral flexion and extension are limited; Lt side bend ~25% and extension ~10% limited since completing radiation - 02/03/21    Status On-going      PT LONG TERM GOAL #3   Title Pt will receive trial of FlexiTouch compression pump for long term management of lymphedema.    Baseline Pt has decided not to puruse this at this time as his swelling is improving since ending radiation but knows how to get this if he changes his mind - 02/03/21    Status Achieved      PT LONG TERM GOAL #4   Title Pt will obtain appropriate compression garment for long term management of head and neck lymphedema.    Baseline Also decided not to pursue this as his neck is improving - 02/03/21    Status Achieved                   Plan - 02/13/21 1004     Clinical Impression Statement Pt reports having some increased soreness where manual therapy performed yesterday as it feels like an area that has not been able to be worked on for quite some time. Other than increased soreness pt did  not seem to have negative affect from manual therapy yesterday so continued with manual therapy here today but also continued to avoid prolonged deep pressure due to recently completing radiation. Pts cervical P/ROM has improved by end of each session and his SCM continues to feel more supple after each session.    Stability/Clinical Decision Making Stable/Uncomplicated    Rehab Potential Good    PT Frequency 2x / week    PT Duration 4 weeks  PT Treatment/Interventions ADLs/Self Care Home Management;Therapeutic exercise;Patient/family education;Manual techniques;Manual lymph drainage;Compression bandaging;Scar mobilization;Passive range of motion;Taping;Vasopneumatic Device    PT Next Visit Plan Trial of 1x/wk for next 2 weeks as pt returns to work and is improving well; Cont manual therapy to Lt>Rt cervical area including STM and P/ROM and progress cervical strength/stability HEP prn    PT Home Exercise Plan head and neck ROM exercises, wear chip pack, self MLD prn; cervical strengthening    Consulted and Agree with Plan of Care Patient             Patient will benefit from skilled therapeutic intervention in order to improve the following deficits and impairments:  Postural dysfunction, Impaired flexibility, Increased fascial restricitons, Decreased knowledge of precautions, Decreased range of motion, Decreased scar mobility, Increased edema  Visit Diagnosis: Lymphedema, not elsewhere classified  Aftercare following surgery for neoplasm  Abnormal posture  Malignant neoplasm of base of tongue ([place])     Problem List Patient Active Problem List   Diagnosis Date Noted   Malignant neoplasm of base of tongue ([place]) 11/01/2020   Metastatic cancer to cervical lymph nodes ([place]) 10/23/2020   Metastatic squamous cell carcinoma to lymph node ([place]) 09/13/2020   Osteoarthritis, knee 08/29/2019   OSA (obstructive sleep apnea) 02/26/2019   Osteoarthritis of left shoulder 09/29/2018    History of left shoulder replacement 09/29/2018   Diabetes mellitus, type 2 ([place]) 07/29/2015   Essential hypertension 04/29/2015   Hyperlipidemia 04/29/2015   Morbid obesity ([place]) 04/29/2015    Otelia Limes, PTA 02/13/2021, 10:19 AM  [facility] @ [place], [place], [address] Phone: [phone]   Fax:  [phone]  Name: ANGUS AMINI MRN: [id number] Date of Birth: 1960-02-22

## 2021-02-18 ENCOUNTER — Encounter: Payer: Self-pay | Admitting: Physical Therapy

## 2021-02-20 ENCOUNTER — Other Ambulatory Visit: Payer: Self-pay

## 2021-02-20 ENCOUNTER — Ambulatory Visit: Payer: BC Managed Care – PPO

## 2021-02-20 DIAGNOSIS — C01 Malignant neoplasm of base of tongue: Secondary | ICD-10-CM

## 2021-02-20 DIAGNOSIS — I890 Lymphedema, not elsewhere classified: Secondary | ICD-10-CM

## 2021-02-20 DIAGNOSIS — R293 Abnormal posture: Secondary | ICD-10-CM

## 2021-02-20 DIAGNOSIS — Z483 Aftercare following surgery for neoplasm: Secondary | ICD-10-CM | POA: Insufficient documentation

## 2021-02-20 DIAGNOSIS — R1310 Dysphagia, unspecified: Secondary | ICD-10-CM | POA: Diagnosis not present

## 2021-02-20 NOTE — Therapy (Signed)
[facility] @ [place], [place], [address] Phone: [phone]   Fax:  [phone]  Physical Therapy Treatment  Patient Details  Name: Gregory Carey MRN: [id number] Date of Birth: [birth year] Referring Provider (PT): Isidore Moos   Encounter Date: 02/20/2021   PT End of Session - 02/20/21 1009     Visit Number 10    Number of Visits 17    Date for PT Re-Evaluation 03/03/21    PT Start Time 0907    PT Stop Time 1005    PT Time Calculation (min) 58 min    Activity Tolerance Patient tolerated treatment well    Behavior During Therapy [facility] for tasks assessed/performed             Past Medical History:  Diagnosis Date   Cancer ([place])    neck   Complication of anesthesia    Diabetes mellitus without complication ([place])    Difficult intubation    Was infomed by MD Anesth his airway was small   Hyperlipidemia    Hypertension    Localized osteoarthritis of left shoulder    Osteoarthritis of left shoulder 09/29/2018   Sleep apnea    uses CPAP    Past Surgical History:  Procedure Laterality Date   BIOPSY  04/23/2020   Procedure: BIOPSY;  Surgeon: Eloise Harman, DO;  [facility];  Service: Endoscopy;;   COLONOSCOPY WITH PROPOFOL N/A 04/23/2020   Procedure: COLONOSCOPY WITH PROPOFOL;  Surgeon: Eloise Harman, DO;  [facility];  Service: Endoscopy;  Laterality: N/A;  am appt   DIRECT LARYNGOSCOPY N/A 10/23/2020   Procedure: DIRECT LARYNGOSCOPY WITH BIOPSIES;  Surgeon: Izora Gala, MD;  Location: [facility];  Service: ENT;  Laterality: N/A;   ESOPHAGOSCOPY N/A 10/23/2020   Procedure: ESOPHAGOSCOPY;  Surgeon: Izora Gala, MD;  Location: [facility];  Service: ENT;  Laterality: N/A;   KNEE ARTHROSCOPY Right 02/16/2006   POLYPECTOMY  04/23/2020   Procedure: POLYPECTOMY;  Surgeon: Eloise Harman, DO;  [facility];  Service: Endoscopy;;   RADICAL NECK DISSECTION Left 10/23/2020   Procedure:  LEFT NECK DISSECTION;  Surgeon: Izora Gala, MD;  Location: [place];  Service: ENT;  Laterality: Left;   SHOULDER ARTHROSCOPY Right 02/17/2000   SKIN CANCER EXCISION     Multiple basal cell and squmous cell leisons removed   TOTAL SHOULDER ARTHROPLASTY Left 09/29/2018   Procedure: TOTAL SHOULDER ARTHROPLASTY;  Surgeon: Nicholes Stairs, MD;  Location: [facility];  Service: Orthopedics;  Laterality: Left;  2.5 hrs    There were no vitals filed for this visit.   Subjective Assessment - 02/20/21 0910     Subjective I started back to work Monday and other than the fatigue after work I'm doing really well. I'm not noticing any increased neck tightness and the lymphedema seems to be doing fine. My wife notices a little increased edema at times under my jaw line but nothing that stays.    Pertinent History Metastatic SCC to lymph node and positive Base of Tongue SCC Stage 1 (T1, N1, M0, p 16 +), 08/30/20 Biopsy of left submandibular lymph node revealed poorly differentiated carcinoma, 7/25 PET revealing hypermetabolic left-sided level II cervical lymphadenopathy. PET also demonstrated relatively symmetric hypermetabolic FDG uptake identified in the anterior hypopharynx, and no discrete oro pharyngeal hypermetabolic lesion evident. Otherwise, no evidence of hypermetabolic metastatic disease was visualized in the chest, abdomen, or pelvis, 09/11/20 CT neck revealing two pathologic  left level 2 lymph nodes as seen on prior PET-CT. No primary lesion or contralateral metastatic node was visualized, 10/23/20 patient underwent direct laryngoscopy and neck dissection by Dr. Constance Holster with biopsies taken from the L BOT and L Tonsil. L BOT revealed invasive moderate to poorly differentiated SCC and the L Tonsil was negative, will receive 35 fractions of radiation to his BOT and bilateral neck. He had CT simulation today and will start 10/13, L shoulder replacement 10 yrs ago, pt reports he needs both knees replaced as well,  diabetes    Patient Stated Goals to gain info from providers    Currently in Pain? No/denies                               [facility] Adult PT Treatment/Exercise - 02/20/21 0001       Self-Care   Other Self-Care Comments  Spent beginning of session assessing pts current functional status. He reports only limitation he felt since returning to work this week was full cervical extension when looking [place]. He could feel pull at Lt anterior throat. Also his fatigue has increased some by end of work day so encouraged him to work on walking about 30 mins/day to improve his endurance. Pt able to verbalize good understanding of this.      Manual Therapy   Soft tissue mobilization With cocoa butter to Lt cervical area and at Lt anterior throat, was able to work entire Lt side of neck today just being mindful of prolonged pressure at insertion of SCM where pt was most tender from radiation    Manual Lymphatic Drainage (MLD) in supine: short neck, 5 diaphragmatic breaths, bilateral axillary nodes, bilateral pectoral nodes, bilateral chest, short and long neck, lateral and anterior neck and submandibular nodes then retracing all steps    Passive ROM In Supine into bil cervical rotation and side bending with and without cervical depression                          PT Long Term Goals - 02/03/21 1614       PT LONG TERM GOAL #1   Title Pt will be independent in self MLD for long term management of lymphedema.    Baseline Pt is independent with self MLD - 02/03/21    Status Achieved      PT LONG TERM GOAL #2   Title Pt will demonstrate full cervical ROM in all directions to allow pt to return to PLOF.    Baseline R lateral flexion and extension are limited; Lt side bend ~25% and extension ~10% limited since completing radiation - 02/03/21    Status On-going      PT LONG TERM GOAL #3   Title Pt will receive trial of FlexiTouch compression pump for long term management of  lymphedema.    Baseline Pt has decided not to puruse this at this time as his swelling is improving since ending radiation but knows how to get this if he changes his mind - 02/03/21    Status Achieved      PT LONG TERM GOAL #4   Title Pt will obtain appropriate compression garment for long term management of head and neck lymphedema.    Baseline Also decided not to pursue this as his neck is improving - 02/03/21    Status Achieved  Plan - 02/20/21 1009     Clinical Impression Statement Overall pt is progressing very well since completing radiation. He reports only feeling limited wiht cervical A/ROM with full extension which he was having to do to look up at a transmission at work and even then he was able to do it. Just could feel pull from tight muscles at end of motion. He is experiencing some increased fatigue at end of work shift but not so much that it's limiting him, he just reports being able to sleep good at night due to this where he used to toss and turn before cancer treatments. He does still have residual myofascial tightness at Lt anterior to later throat so focused manual therapy here today with MFR, then with STM using cocoa butter. Also included MLD today to work out any residual fluid that may be interacting with fascial restrictions. Pt feels he may be ready for D/C at next session as he is progressing so well.    Stability/Clinical Decision Making Stable/Uncomplicated    Rehab Potential Good    PT Frequency 2x / week    PT Duration 4 weeks    PT Treatment/Interventions ADLs/Self Care Home Management;Therapeutic exercise;Patient/family education;Manual techniques;Manual lymph drainage;Compression bandaging;Scar mobilization;Passive range of motion;Taping;Vasopneumatic Device    PT Next Visit Plan Pt may be ready for D/C at next session; Cont manual therapy to Lt cervical area including STM and P/ROM and progress cervical strength/stability HEP prn     PT Home Exercise Plan head and neck ROM exercises, wear chip pack, self MLD prn; cervical strengthening    Consulted and Agree with Plan of Care Patient             Patient will benefit from skilled therapeutic intervention in order to improve the following deficits and impairments:  Postural dysfunction, Impaired flexibility, Increased fascial restricitons, Decreased knowledge of precautions, Decreased range of motion, Decreased scar mobility, Increased edema  Visit Diagnosis: Lymphedema, not elsewhere classified  Aftercare following surgery for neoplasm  Abnormal posture  Malignant neoplasm of base of tongue ([place])     Problem List Patient Active Problem List   Diagnosis Date Noted   Malignant neoplasm of base of tongue ([place]) 11/01/2020   Metastatic cancer to cervical lymph nodes ([place]) 10/23/2020   Metastatic squamous cell carcinoma to lymph node ([place]) 09/13/2020   Osteoarthritis, knee 08/29/2019   OSA (obstructive sleep apnea) 02/26/2019   Osteoarthritis of left shoulder 09/29/2018   History of left shoulder replacement 09/29/2018   Diabetes mellitus, type 2 ([place]) 07/29/2015   Essential hypertension 04/29/2015   Hyperlipidemia 04/29/2015   Morbid obesity ([place]) 04/29/2015    Otelia Limes, PTA 02/20/2021, 10:21 AM  [facility] @ [place], [place], [address] Phone: [phone]   Fax:  [phone]  Name: Gregory Carey MRN: [id number] Date of Birth: Apr 13, 1960

## 2021-02-25 ENCOUNTER — Other Ambulatory Visit: Payer: Self-pay

## 2021-02-25 ENCOUNTER — Encounter: Payer: Self-pay | Admitting: Physical Therapy

## 2021-02-25 ENCOUNTER — Ambulatory Visit: Payer: BC Managed Care – PPO | Admitting: Physical Therapy

## 2021-02-25 ENCOUNTER — Ambulatory Visit: Payer: BC Managed Care – PPO | Attending: Radiation Oncology

## 2021-02-25 DIAGNOSIS — R1310 Dysphagia, unspecified: Secondary | ICD-10-CM | POA: Diagnosis not present

## 2021-02-25 DIAGNOSIS — Z483 Aftercare following surgery for neoplasm: Secondary | ICD-10-CM

## 2021-02-25 DIAGNOSIS — I890 Lymphedema, not elsewhere classified: Secondary | ICD-10-CM

## 2021-02-25 DIAGNOSIS — R293 Abnormal posture: Secondary | ICD-10-CM

## 2021-02-25 DIAGNOSIS — C01 Malignant neoplasm of base of tongue: Secondary | ICD-10-CM

## 2021-02-25 NOTE — Therapy (Signed)
[facility] [address], [place], [place], [address] Phone: [phone]   Fax:  [phone]  Speech Language Pathology Treatment/ Renewal summary  Patient Details  Name: Gregory Carey MRN: [id number] Date of Birth: January 22, 1961 Referring Provider (SLP): Eppie Gibson, MD   Encounter Date: 02/25/2021   End of Session - 02/25/21 1101     Visit Number 4    Number of Visits 7    Date for SLP Re-Evaluation 05/26/21    SLP Start Time 95    SLP Stop Time  6384    SLP Time Calculation (min) 31 min    Activity Tolerance Patient tolerated treatment well             Past Medical History:  Diagnosis Date   Cancer ([place])    neck   Complication of anesthesia    Diabetes mellitus without complication ([place])    Difficult intubation    Was infomed by MD Anesth his airway was small   Hyperlipidemia    Hypertension    Localized osteoarthritis of left shoulder    Osteoarthritis of left shoulder 09/29/2018   Sleep apnea    uses CPAP    Past Surgical History:  Procedure Laterality Date   BIOPSY  04/23/2020   Procedure: BIOPSY;  Surgeon: Eloise Harman, DO;  [facility];  Service: Endoscopy;;   COLONOSCOPY WITH PROPOFOL N/A 04/23/2020   Procedure: COLONOSCOPY WITH PROPOFOL;  Surgeon: Eloise Harman, DO;  [facility];  Service: Endoscopy;  Laterality: N/A;  am appt   DIRECT LARYNGOSCOPY N/A 10/23/2020   Procedure: DIRECT LARYNGOSCOPY WITH BIOPSIES;  Surgeon: Izora Gala, MD;  Location: [facility];  Service: ENT;  Laterality: N/A;   ESOPHAGOSCOPY N/A 10/23/2020   Procedure: ESOPHAGOSCOPY;  Surgeon: Izora Gala, MD;  Location: [facility];  Service: ENT;  Laterality: N/A;   KNEE ARTHROSCOPY Right 02/16/2006   POLYPECTOMY  04/23/2020   Procedure: POLYPECTOMY;  Surgeon: Eloise Harman, DO;  [facility];  Service: Endoscopy;;   RADICAL NECK DISSECTION Left 10/23/2020   Procedure: LEFT NECK DISSECTION;   Surgeon: Izora Gala, MD;  Location: [facility];  Service: ENT;  Laterality: Left;   SHOULDER ARTHROSCOPY Right 02/17/2000   SKIN CANCER EXCISION     Multiple basal cell and squmous cell leisons removed   TOTAL SHOULDER ARTHROPLASTY Left 09/29/2018   Procedure: TOTAL SHOULDER ARTHROPLASTY;  Surgeon: Nicholes Stairs, MD;  Location: [facility];  Service: Orthopedics;  Laterality: Left;  2.5 hrs    There were no vitals filed for this visit.   Subjective Assessment - 02/25/21 1020     Subjective "Pretty much (eating) anything I want." Pop Tarts, biscuits, eggs, broccoli chicken calzone. taste is returning    Currently in Pain? No/denies                   ADULT SLP TREATMENT - 02/25/21 1022       General Information   Behavior/Cognition Alert;Cooperative;Pleasant mood      Treatment Provided   Treatment provided Dysphagia      Dysphagia Treatment   Temperature Spikes Noted No    Oral Cavity - Dentition Adequate natural dentition    Treatment Methods Skilled observation    Patient observed directly with PO's Yes    Type of PO's observed Dysphagia 3 (soft);Thin liquids    Liquids provided via Cup    Oral Phase Signs & Symptoms Other (comment)  none noted   Pharyngeal Phase Signs & Symptoms Other (comment)   none noted today   Other treatment/comments Heartburn reported after pt has a meal, not a snack, Pt not adhering to recommended scope nor frequency of HEP. SLP again reiterated rationale after pt told SLP rationale corrrectly. SLP told pt that swallowing status will decrease from good (like now) to worse if muscle fibrosis sets in, which has greatest chance of doing this in the next 5 months according to research. Pt going to leave a sheet of exercises at his desk at work.      Assessment / Recommendations / Plan   Plan Continue with current plan of care      Dysphagia Recommendations   Diet recommendations --   as tolerated   Medication Administration --   as tolerated      Progression Toward Goals   Progression toward goals Not progressing toward goals (comment)   pt not choosing to perform HEP as directed             SLP Education - 02/25/21 1100     Education Details rationale for HEP    Person(s) Educated Patient    Methods Explanation    Comprehension Verbalized understanding           SLP Short Term Goals - 02-25-21     SLP SHORT TERM GOAL #1      Title pt will complete HEP with rare min A    Time      Period --   vists, for all STGs    Status Achieved           SLP SHORT TERM GOAL #2    Title pt will tell SLP why pt is completing HEP with modified independence    Time      Status Achieved           SLP SHORT TERM GOAL #3    Title pt will describe 3 overt s/s aspiration PNA with modified independence    Time      Status Achieved           SLP SHORT TERM GOAL #4    Title pt will tell SLP how a food journal could hasten return to a more normalized diet    Time      Status  Achieved                    SLP Long Term Goals - 02-25-21                      SLP LONG TERM GOAL #1    Title pt will complete HEP with modified independence over two visits    Time 1    Period --   visits, for all LTGs    Status Ongoing           SLP LONG TERM GOAL #2    Title pt will describe how to modify HEP over time, and the timeline associated with reduction in HEP frequency with modified independence over two sessions    Time 2    Status Ongoing                    Plan - 02-25-21      Clinical Impression Statement At this time pt swallowing is deemed WNL/WFL with dys I-II-III (as tolerated) and thin liquids. SLP reviewed pt's individualized HEP for dysphagia and pt completed each exercise  on their own independently by session end. There are no overt s/s aspiration reported by pt at this time, although pt cleared throat throughout session- thick saliva(?). Data indicate that pt's swallow ability will likely decrease over the  course of radiation/chemotherapy and could very well decline over time following conclusion of their radiation therapy due to muscle disuse atrophy and/or muscle fibrosis. Currently pt is not performing HEP to recommended frequency/ with recommended scope and concern exists for muscle fibrosis. *Pt will cont to need to be seen by SLP in order to assess safety of PO intake, assess the need for recommending any objective swallow assessment, and ensuring pt correctly completes the individualized HEP.*    Speech Therapy Frequency --   once approx every four weeks    Duration --   90 days for this reporting period; overall, approx 7 visits    Treatment/Interventions Aspiration precaution training;Pharyngeal strengthening exercises;Diet toleration management by SLP;Trials of upgraded texture/liquids;Patient/family education;SLP instruction and feedback;Compensatory techniques    Potential to Achieve Goals Good    SLP Home Exercise Plan provided    Consulted and Agree with Plan of Care Patient               Patient will benefit from skilled therapeutic intervention in order to improve the following deficits and impairments:   Dysphagia, unspecified type    Problem List Patient Active Problem List   Diagnosis Date Noted   Malignant neoplasm of base of tongue ([place]) 11/01/2020   Metastatic cancer to cervical lymph nodes ([place]) 10/23/2020   Metastatic squamous cell carcinoma to lymph node ([place]) 09/13/2020   Osteoarthritis, knee 08/29/2019   OSA (obstructive sleep apnea) 02/26/2019   Osteoarthritis of left shoulder 09/29/2018   History of left shoulder replacement 09/29/2018   Diabetes mellitus, type 2 ([place]) 07/29/2015   Essential hypertension 04/29/2015   Hyperlipidemia 04/29/2015   Morbid obesity ([place]) 04/29/2015    [place], [place] 02/25/2021, 11:03 AM  [facility] [address], [place], [place], [address] Phone: [phone]    Fax:  [phone]   Name: Gregory Carey MRN: [id number] Date of Birth: 1960/12/13

## 2021-02-25 NOTE — Therapy (Signed)
[facility] @ [place], [place], [address] Phone: [phone]   Fax:  [phone]  Physical Therapy Treatment  Patient Details  Name: Gregory Carey MRN: [id number] Date of Birth: 10-Mar-1960 Referring Provider (PT): Isidore Moos   Encounter Date: 02/25/2021   PT End of Session - 02/25/21 0956     Visit Number 11    Number of Visits 17    Date for PT Re-Evaluation 03/03/21    PT Start Time 0904    PT Stop Time 0953    PT Time Calculation (min) 49 min    Activity Tolerance Patient tolerated treatment well    Behavior During Therapy [facility] for tasks assessed/performed             Past Medical History:  Diagnosis Date   Cancer ([place])    neck   Complication of anesthesia    Diabetes mellitus without complication ([place])    Difficult intubation    Was infomed by MD Anesth his airway was small   Hyperlipidemia    Hypertension    Localized osteoarthritis of left shoulder    Osteoarthritis of left shoulder 09/29/2018   Sleep apnea    uses CPAP    Past Surgical History:  Procedure Laterality Date   BIOPSY  04/23/2020   Procedure: BIOPSY;  Surgeon: Eloise Harman, DO;  [facility];  Service: Endoscopy;;   COLONOSCOPY WITH PROPOFOL N/A 04/23/2020   Procedure: COLONOSCOPY WITH PROPOFOL;  Surgeon: Eloise Harman, DO;  [facility];  Service: Endoscopy;  Laterality: N/A;  am appt   DIRECT LARYNGOSCOPY N/A 10/23/2020   Procedure: DIRECT LARYNGOSCOPY WITH BIOPSIES;  Surgeon: Izora Gala, MD;  Location: [facility];  Service: ENT;  Laterality: N/A;   ESOPHAGOSCOPY N/A 10/23/2020   Procedure: ESOPHAGOSCOPY;  Surgeon: Izora Gala, MD;  Location: [facility];  Service: ENT;  Laterality: N/A;   KNEE ARTHROSCOPY Right 02/16/2006   POLYPECTOMY  04/23/2020   Procedure: POLYPECTOMY;  Surgeon: Eloise Harman, DO;  [facility];  Service: Endoscopy;;   RADICAL NECK DISSECTION Left 10/23/2020    Procedure: LEFT NECK DISSECTION;  Surgeon: Izora Gala, MD;  Location: [facility];  Service: ENT;  Laterality: Left;   SHOULDER ARTHROSCOPY Right 02/17/2000   SKIN CANCER EXCISION     Multiple basal cell and squmous cell leisons removed   TOTAL SHOULDER ARTHROPLASTY Left 09/29/2018   Procedure: TOTAL SHOULDER ARTHROPLASTY;  Surgeon: Nicholes Stairs, MD;  Location: [facility];  Service: Orthopedics;  Laterality: Left;  2.5 hrs    There were no vitals filed for this visit.   Subjective Assessment - 02/25/21 0905     Subjective The tightness in my  neck is getting better. I think I am ok to discharge at this time.    Pertinent History Metastatic SCC to lymph node and positive Base of Tongue SCC Stage 1 (T1, N1, M0, p 16 +), 08/30/20 Biopsy of left submandibular lymph node revealed poorly differentiated carcinoma, 7/25 PET revealing hypermetabolic left-sided level II cervical lymphadenopathy. PET also demonstrated relatively symmetric hypermetabolic FDG uptake identified in the anterior hypopharynx, and no discrete oro pharyngeal hypermetabolic lesion evident. Otherwise, no evidence of hypermetabolic metastatic disease was visualized in the chest, abdomen, or pelvis, 09/11/20 CT neck revealing two pathologic left level 2 lymph nodes as seen on prior PET-CT. No primary lesion or contralateral metastatic node was visualized, 10/23/20 patient underwent direct laryngoscopy and neck dissection by Dr. Constance Holster  with biopsies taken from the L BOT and L Tonsil. L BOT revealed invasive moderate to poorly differentiated SCC and the L Tonsil was negative, will receive 35 fractions of radiation to his BOT and bilateral neck. He had CT simulation today and will start 10/13, L shoulder replacement 10 yrs ago, pt reports he needs both knees replaced as well, diabetes    Patient Stated Goals to gain info from providers    Currently in Pain? No/denies    Pain Score 0-No pain                [facility] PT Assessment - 02/25/21  0001       AROM   Cervical Flexion WFL    Cervical Extension 10% limited    Cervical - Right Side Bend WFL    Cervical - Left Side Bend WFL    Cervical - Right Rotation WFL    Cervical - Left Rotation [facility]                           [facility] Adult PT Treatment/Exercise - 02/25/21 0001       Manual Therapy   Soft tissue mobilization With cocoa butter to Lt cervical area and at Lt anterior throat, was able to work entire Lt side of neck today    Manual Lymphatic Drainage (MLD) in supine: short neck, 5 diaphragmatic breaths, bilateral axillary nodes, bilateral pectoral nodes, bilateral chest, short and long neck, lateral and anterior neck and submandibular nodes then retracing all steps    Passive ROM In Supine into bil cervical rotation and side bending with and without clavicula depression                          PT Long Term Goals - 02/25/21 0905       PT LONG TERM GOAL #1   Title Pt will be independent in self MLD for long term management of lymphedema.    Baseline Pt is independent with self MLD - 02/03/21    Time 8    Period Weeks    Status Achieved      PT LONG TERM GOAL #2   Title Pt will demonstrate full cervical ROM in all directions to allow pt to return to PLOF.    Baseline R lateral flexion and extension are limited; Lt side bend ~25% and extension ~10% limited since completing radiation - 02/03/21; 02/25/21- all WFL but extension is 10% limited but it was 25% limited prior to treatment    Time 8    Period Weeks    Status Achieved      PT LONG TERM GOAL #3   Title Pt will receive trial of FlexiTouch compression pump for long term management of lymphedema.    Baseline Pt has decided not to puruse this at this time as his swelling is improving since ending radiation but knows how to get this if he changes his mind - 02/03/21    Time 8    Period Weeks    Status Achieved      PT LONG TERM GOAL #4   Title Pt will obtain appropriate  compression garment for long term management of head and neck lymphedema.    Baseline Also decided not to pursue this as his neck is improving - 02/03/21    Time 8    Period Weeks    Status Achieved  Plan - 02/25/21 0957     Clinical Impression Statement Assessed pt's progress towards goals in therapy. Pt has now met all goals for therapy. He has returned to baseline cervical ROM. He has some tightness just under left jaw line but feels it is improving and he reports his wife helps him with massage to this area. His lymphedema is decreasing and is now able to manage this independently. Pt will be discharged from skilled PT services at this time.    PT Frequency 2x / week    PT Duration 4 weeks    PT Treatment/Interventions ADLs/Self Care Home Management;Therapeutic exercise;Patient/family education;Manual techniques;Manual lymph drainage;Compression bandaging;Scar mobilization;Passive range of motion;Taping;Vasopneumatic Device    PT Next Visit Plan d/c this session    PT Home Exercise Plan head and neck ROM exercises, wear chip pack, self MLD prn; cervical strengthening    Consulted and Agree with Plan of Care Patient             Patient will benefit from skilled therapeutic intervention in order to improve the following deficits and impairments:  Postural dysfunction, Impaired flexibility, Increased fascial restricitons, Decreased knowledge of precautions, Decreased range of motion, Decreased scar mobility, Increased edema  Visit Diagnosis: Lymphedema, not elsewhere classified  Aftercare following surgery for neoplasm  Abnormal posture  Malignant neoplasm of base of tongue ([place])     Problem List Patient Active Problem List   Diagnosis Date Noted   Malignant neoplasm of base of tongue ([place]) 11/01/2020   Metastatic cancer to cervical lymph nodes ([place]) 10/23/2020   Metastatic squamous cell carcinoma to lymph node ([place]) 09/13/2020   Osteoarthritis,  knee 08/29/2019   OSA (obstructive sleep apnea) 02/26/2019   Osteoarthritis of left shoulder 09/29/2018   History of left shoulder replacement 09/29/2018   Diabetes mellitus, type 2 ([place]) 07/29/2015   Essential hypertension 04/29/2015   Hyperlipidemia 04/29/2015   Morbid obesity ([place]) 04/29/2015    Lachell Rochette Breedlove Felton, PT 02/25/2021, 9:59 AM  [facility] @ [place] [place], [place], [address] Phone: [phone]   Fax:  [phone]  Name: Gregory Carey MRN: [id number] Date of Birth: Jul 16, 1960   PHYSICAL THERAPY DISCHARGE SUMMARY  Visits from Start of Care: 11  Current functional level related to goals / functional outcomes: All goals met   Remaining deficits: Some tightness remains just under jaw line on L side but pt reports this is improving   Education / Equipment: HEP, self MLD, compression   Patient agrees to discharge. Patient goals were met. Patient is being discharged due to meeting the stated rehab goals.  [employer], PT 02/25/21 10:00 AM

## 2021-02-27 ENCOUNTER — Encounter: Payer: Self-pay | Admitting: Physical Therapy

## 2021-03-03 ENCOUNTER — Encounter: Payer: Self-pay | Admitting: Physical Therapy

## 2021-03-05 ENCOUNTER — Encounter: Payer: Self-pay | Admitting: Family Medicine

## 2021-03-07 ENCOUNTER — Ambulatory Visit (INDEPENDENT_AMBULATORY_CARE_PROVIDER_SITE_OTHER): Payer: Self-pay | Admitting: Dentistry

## 2021-03-07 ENCOUNTER — Other Ambulatory Visit: Payer: Self-pay

## 2021-03-07 VITALS — BP 119/89 | HR 62 | Temp 98.4°F | Wt 233.0 lb

## 2021-03-07 DIAGNOSIS — Z923 Personal history of irradiation: Secondary | ICD-10-CM

## 2021-03-07 DIAGNOSIS — Y842 Radiological procedure and radiotherapy as the cause of abnormal reaction of the patient, or of later complication, without mention of misadventure at the time of the procedure: Secondary | ICD-10-CM

## 2021-03-07 DIAGNOSIS — R634 Abnormal weight loss: Secondary | ICD-10-CM | POA: Insufficient documentation

## 2021-03-07 DIAGNOSIS — K117 Disturbances of salivary secretion: Secondary | ICD-10-CM

## 2021-03-07 MED ORDER — SODIUM FLUORIDE 1.1 % DT GEL: DENTAL | 11 refills | Status: DC

## 2021-03-07 NOTE — Progress Notes (Signed)
Department of Dental Medicine    Service Date:   03/07/2021  Patient Name:  Gregory Carey Date of Birth:   1961-02-12 Medical Record Number: [id number]   LIMITED EXAM PLAN/RECOMMENDATIONS   ASSESSMENT: Xerostomia, weight loss Caries  RECOMMENDATIONS: Brush after meals & at bedtime. Use fluoride at bedtime. Use trismus exercises as directed. Use CLoSYS & warm salt water/baking soda rinses as needed. Take multiple sips of water as needed.  PLAN: Follow-up as needed.  Rx:  Sodium fluoride 1.1% gel to use with fluoride trays. Return to primary dentist  for routine dental care including replacement of missing teeth as needed, cleanings/periodontal therapy and exams. Call if any questions or concerns arise.       03/07/2021 CONSULT NOTE:    COVID-19 SCREENING:  The patient denies symptoms concerning for COVID-19 infection including fever, chills, cough, or newly developed shortness of breath.   HISTORY OF PRESENT ILLNESS: Gregory Carey is a 61 y.o. male who presents today for an oral examination after radiation therapy for base of tongue cancer (squamous cell carcinoma, metastatic to lymph nodes) and for upper/lower fluoride tray delivery.   The patient has completed 35 of 35 radiation treatments from 11/28/2020 to 01/16/2021.  They did not have any chemotherapy treatments.   REVIEW OF CHIEF COMPLAINTS: Dry mouth: Yes but not too bad Hard to swallow: No Hurts to swallow:  No Taste changes:  Taste has mostly returned, but some things occasionally will still taste different Sores in mouth:  No Limited opening:  Denies any symptoms Weight loss:  Yes 233 lbs down from 277 lbs   AT- HOME ORAL HYGIENE REGIMEN: Brushing:  Twice a day Flossing:  Occasionally Rinsing:  He has been using CLoSYS as needed Fluoride:  No; delivering fluoride trays today. Trismus:  No, he has not lost any jaw opening since first visit. Maximum Interincisal Opening:  40 mm   Patient Active  Problem List   Diagnosis Date Noted   Malignant neoplasm of base of tongue ([place]) 11/01/2020   Metastatic cancer to cervical lymph nodes ([place]) 10/23/2020   Metastatic squamous cell carcinoma to lymph node ([place]) 09/13/2020   Osteoarthritis, knee 08/29/2019   OSA (obstructive sleep apnea) 02/26/2019   Osteoarthritis of left shoulder 09/29/2018   History of left shoulder replacement 09/29/2018   Diabetes mellitus, type 2 ([place]) 07/29/2015   Essential hypertension 04/29/2015   Hyperlipidemia 04/29/2015   Morbid obesity ([place]) 04/29/2015   Past Medical History:  Diagnosis Date   Cancer ([place])    neck   Complication of anesthesia    Diabetes mellitus without complication ([place])    Difficult intubation    Was infomed by MD Anesth his airway was small   Hyperlipidemia    Hypertension    Localized osteoarthritis of left shoulder    Osteoarthritis of left shoulder 09/29/2018   Sleep apnea    uses CPAP   Past Surgical History:  Procedure Laterality Date   BIOPSY  04/23/2020   Procedure: BIOPSY;  Surgeon: Eloise Harman, DO;  [facility];  Service: Endoscopy;;   COLONOSCOPY WITH PROPOFOL N/A 04/23/2020   Procedure: COLONOSCOPY WITH PROPOFOL;  Surgeon: Eloise Harman, DO;  [facility];  Service: Endoscopy;  Laterality: N/A;  am appt   DIRECT LARYNGOSCOPY N/A 10/23/2020   Procedure: DIRECT LARYNGOSCOPY WITH BIOPSIES;  Surgeon: Izora Gala, MD;  Location: [facility];  Service: ENT;  Laterality: N/A;   ESOPHAGOSCOPY N/A 10/23/2020   Procedure: ESOPHAGOSCOPY;  Surgeon: Izora Gala, MD;  Location: [place];  Service: ENT;  Laterality: N/A;   KNEE ARTHROSCOPY Right 02/16/2006   POLYPECTOMY  04/23/2020   Procedure: POLYPECTOMY;  Surgeon: Eloise Harman, DO;  [facility];  Service: Endoscopy;;   RADICAL NECK DISSECTION Left 10/23/2020   Procedure: LEFT NECK DISSECTION;  Surgeon: Izora Gala, MD;  Location: [facility];  Service: ENT;  Laterality: Left;   SHOULDER  ARTHROSCOPY Right 02/17/2000   SKIN CANCER EXCISION     Multiple basal cell and squmous cell leisons removed   TOTAL SHOULDER ARTHROPLASTY Left 09/29/2018   Procedure: TOTAL SHOULDER ARTHROPLASTY;  Surgeon: Nicholes Stairs, MD;  Location: [facility];  Service: Orthopedics;  Laterality: Left;  2.5 hrs   Current Outpatient Medications  Medication Sig Dispense Refill   amLODipine (NORVASC) 5 MG tablet Take 1 tablet (5 mg total) by mouth daily. 90 tablet 3   aspirin EC 81 MG tablet Take 1 tablet (81 mg total) by mouth daily. 90 tablet 1   blood glucose meter kit and supplies Dispense based on patient and insurance preference. Use up to four times daily as directed. (FOR ICD-9 250.00, 250.01). 1 each 0   busPIRone (BUSPAR) 5 MG tablet Take 1 tablet (5 mg total) by mouth 3 (three) times daily as needed. 90 tablet 1   CONTOUR NEXT TEST test strip USE AS DIRECTED TWICE DAILY 100 each 0   lidocaine (XYLOCAINE) 2 % solution Patient: Mix 1part 2% viscous lidocaine, 1part H20. Swallow 29m of diluted mixture, 342m before meals and at bedtime, up to QID (Patient not taking: Reported on 01/29/2021) 200 mL 3   lisinopril-hydrochlorothiazide (ZESTORETIC) 20-12.5 MG tablet Take 2 tablets by mouth daily. 180 tablet 2   metFORMIN (GLUCOPHAGE-XR) 750 MG 24 hr tablet Take 1 tablet by mouth once daily with breakfast 90 tablet 0   ondansetron (ZOFRAN ODT) 8 MG disintegrating tablet Take 1 tablet (8 mg total) by mouth every 8 (eight) hours as needed for nausea or vomiting. 20 tablet 1   rosuvastatin (CRESTOR) 20 MG tablet Take 1 tablet (20 mg total) by mouth daily. 90 tablet 3   TRULICITY 0.9.02GIO/9.7DZOPN INJECT 1 DOSE (0.75MG TOTAL) SUBCUTANEOUSLY ONCE A WEEK 4 mL 0   No current facility-administered medications for this visit.   No Known Allergies   VITALS: BP 119/89 (BP Location: Left Arm, Patient Position: Sitting, Cuff Size: Normal)    Pulse 62    Temp 98.4 F (36.9 C) (Oral)    Wt 233 lb (105.7 kg)     BMI 33.43 kg/m    DENTAL EXAM: Oral hygiene (plaque):  No Mucositis:  No Description of saliva:  Thick, viscous saliva Exposed bone:  No Other watched areas:  #2, #17 and #18 caries; clinically have not progressed since first visit   ASSESSMENT/DIAGNOSES: Patient with history of radiation therapy to the head and neck region.  Xerostomia:  He does not report severe symptoms, but does notice dry mouth worsens at night and during work when he has to talk more.  Recommend continued use of CLoSYS and taking multiple sips of water to manage symptoms.  Weight Loss:  He did lose quite a bit of weight, but is maintaining weight at this time and continues to improve.   PROCEDURES: Delivery of upper and lower fluoride trays. Appliances were tried in and adjusted as needed.  Polished. Postoperative instructions were provided in a written and verbal format concerning the use and care of appliances.  PLAN AND RECOMMENDATIONS: Brush after meals and at bedtime.  Use fluoride at bedtime. Use trismus exercises as directed. Use CLoSYS and salt water/baking soda rinses as needed. Take multiple sips of water as needed.  Return to regular dentist for routine dental care including cleanings, periodic exams and replacement of missing teeth as needed. Follow-up as needed in the hospital clinic.   Call if any problems or concerns arise.  All questions and concerns were invited and addressed.  The patient tolerated today's visit well and departed in stable condition.  Charlaine Dalton, D.M.D.

## 2021-03-07 NOTE — Patient Instructions (Signed)
[facility] Department of Dental Medicine Dr. Debe Coder B. Benson [place], D.M.D. Phone: [phone] Fax: [phone]      It was a pleasure seeing you today!  Please refer to the information below regarding your dental visit with [name].  Please do not hesitate to give [name] a call if any questions or concerns come up after you leave.     Thank you for letting us provide care for you.  If there is anything we can do for you, please let us know.    RECOMMENDATIONS: Brush after meals and at bedtime.  Floss once a day, and use fluoride at bedtime. Use trismus exercises as directed below. Use CLoSYS for dry mouth and salt water/baking soda rinses as needed. Take multiple sips of water as needed.  Stay hydrated. Return to your regular dentist for routine dental care including cleanings and periodic exams.  If you do not have a regular dentist, it is important to establish care at an outside dental office of your choice.  Call us if any problems or concerns arise.   TRISMUS:  Trismus is a condition where the jaw does not allow the mouth to open as wide as it usually does.  This can happen almost suddenly, or in other cases the process is so slow, it is hard to notice it-until it is too far along.  When the jaw joints and/or muscles have been exposed to radiation treatments, the onset of trismus is very slow.  This is because the muscles are losing their stretching ability over a long period of time, as long as 2 YEARS after the end of radiation.  It is therefore important to exercise these muscles and joints.  TRISMUS EXERCISES: Use the stack of tongue depressors measuring the same or a little less than your maximum opening that was recorded at your first dental visit. Place the stack in your mouth, supporting the other end with your hand. Allow 30 seconds for muscle stretching. Rest for a few seconds. Repeat 3-5 times. For all radiation patients, this exercise is  recommended in the mornings and evenings unless otherwise instructed. The exercises should be done for a period of 2 YEARS after the end of radiation. Maximum jaw opening should be checked routinely on recall dental visits by your general dentist. Report any changes, soreness, or difficulties encountered when doing the exercises to your dentist.    [place]? The best time to use your Fluoride is in the morning after breakfast, or right before bed time.  You must brush your teeth very well and floss before using the Fluoride in order to get the best use out of the Fluoride treatments. Place 1 drop of Fluoride in each tooth space in the tray.  You do not need to use more. Place the tray(s) over your teeth.  Make sure the trays are seated all the way.  Remember, they only fit one way on your teeth. Clench your teeth together a few times with light chewing motions to distribute the fluoride. Leave the trays in place for a full 5 minutes.  You may drool a bit, so keep a tissue handy. At the end of the 5 minutes, take the trays out.  SPIT OUT the remaining fluoride, but DO NOT RINSE. DO NOT eat or drink after treatments for at least 30 minutes.  This is why the best time for your treatments is before bedtime.  HOW DO I CARE FOR MY FLUORIDE TRAYS? Clean the inside of your Fluoride trays using cold water and a toothbrush. In order to keep your trays from discoloring and free from odors, soak them overnight in denture cleaners or soap and water.  Do not use bleach or non-denture products. Store the trays in a safe dry place AWAY from any heat until your next treatment.  Finally, be sure to let your pharmacy know when you are close to needing a new refill for your fluoride prescription so they have it ready for you without interruption of fluoride use.  If anything happens to your fluoride trays, or they don't fit as well after any dental work,  please let us know as soon as possible.    Questions?  Call our office during office hours at [phone].

## 2021-03-10 ENCOUNTER — Other Ambulatory Visit: Payer: Self-pay | Admitting: Family

## 2021-03-10 DIAGNOSIS — E1165 Type 2 diabetes mellitus with hyperglycemia: Secondary | ICD-10-CM

## 2021-03-15 ENCOUNTER — Other Ambulatory Visit: Payer: Self-pay | Admitting: Family

## 2021-03-15 DIAGNOSIS — E1165 Type 2 diabetes mellitus with hyperglycemia: Secondary | ICD-10-CM

## 2021-03-25 ENCOUNTER — Ambulatory Visit: Payer: BC Managed Care – PPO | Attending: Radiation Oncology

## 2021-03-25 ENCOUNTER — Other Ambulatory Visit: Payer: Self-pay

## 2021-03-25 DIAGNOSIS — R1310 Dysphagia, unspecified: Secondary | ICD-10-CM | POA: Insufficient documentation

## 2021-03-25 NOTE — Therapy (Signed)
[facility] [address] [place], [place], [address] Phone: [phone]   Fax:  [phone]  Speech Language Pathology Treatment  Patient Details  Name: Gregory HOLSINGER MRN: [id number] Date of Birth: [birth year] Referring Provider (SLP): Eppie Gibson, MD   Encounter Date: 03/25/2021   End of Session - 03/25/21 1113     Visit Number 5    Number of Visits 7    Date for SLP Re-Evaluation 05/26/21    SLP Start Time 0850    SLP Stop Time  0926    SLP Time Calculation (min) 36 min    Activity Tolerance Patient tolerated treatment well             Past Medical History:  Diagnosis Date   Cancer ([place])    neck   Complication of anesthesia    Diabetes mellitus without complication ([place])    Difficult intubation    Was infomed by MD Anesth his airway was small   Hyperlipidemia    Hypertension    Localized osteoarthritis of left shoulder    Osteoarthritis of left shoulder 09/29/2018   Sleep apnea    uses CPAP    Past Surgical History:  Procedure Laterality Date   BIOPSY  04/23/2020   Procedure: BIOPSY;  Surgeon: Eloise Harman, DO;  [facility];  Service: Endoscopy;;   COLONOSCOPY WITH PROPOFOL N/A 04/23/2020   Procedure: COLONOSCOPY WITH PROPOFOL;  Surgeon: Eloise Harman, DO;  [facility];  Service: Endoscopy;  Laterality: N/A;  am appt   DIRECT LARYNGOSCOPY N/A 10/23/2020   Procedure: DIRECT LARYNGOSCOPY WITH BIOPSIES;  Surgeon: Izora Gala, MD;  Location: [place];  Service: ENT;  Laterality: N/A;   ESOPHAGOSCOPY N/A 10/23/2020   Procedure: ESOPHAGOSCOPY;  Surgeon: Izora Gala, MD;  Location: [place];  Service: ENT;  Laterality: N/A;   KNEE ARTHROSCOPY Right 02/16/2006   POLYPECTOMY  04/23/2020   Procedure: POLYPECTOMY;  Surgeon: Eloise Harman, DO;  [facility];  Service: Endoscopy;;   RADICAL NECK DISSECTION Left 10/23/2020   Procedure: LEFT NECK DISSECTION;  Surgeon: Izora Gala,  MD;  Location: [place];  Service: ENT;  Laterality: Left;   SHOULDER ARTHROSCOPY Right 02/17/2000   SKIN CANCER EXCISION     Multiple basal cell and squmous cell leisons removed   TOTAL SHOULDER ARTHROPLASTY Left 09/29/2018   Procedure: TOTAL SHOULDER ARTHROPLASTY;  Surgeon: Nicholes Stairs, MD;  Location: [facility] [place];  Service: Orthopedics;  Laterality: Left;  2.5 hrs    There were no vitals filed for this visit.   Subjective Assessment - 03/25/21 0850     Subjective Pt had steak Sunday, with french fries.    Currently in Pain? No/denies                   ADULT SLP TREATMENT - 03/25/21 0850       General Information   Behavior/Cognition Alert;Cooperative;Pleasant mood      Treatment Provided   Treatment provided Dysphagia      Dysphagia Treatment   Temperature Spikes Noted No    Oral Cavity - Dentition Adequate natural dentition    Treatment Methods Skilled observation    Patient observed directly with PO's Yes    Type of PO's observed Regular;Thin liquids    Liquids provided via Cup    Oral Phase Signs & Symptoms --   small bites   Pharyngeal Phase Signs & Symptoms Other (comment)  throat clear consistent last 5 minutes of cracker/water. Told pt to effortful swallow and double swallow.   Other treatment/comments Heartburn is less now, pt states. Pt not performing HEP to frequency and scope recommended to him. SLP encrouaged him to do HEP on his way to/home from work. Pt independent with HEP procedure.      Assessment / Recommendations / Plan   Plan Other (Comment)   reduce frequency to once every 8 weeks due to HEP procedure done correctly, and pt eating safely (demonstrated today)     Dysphagia Recommendations   Diet recommendations Regular;Thin liquid    Medication Administration --   as tolerated     Progression Toward Goals   Progression toward goals Progressing toward goals   however choosing to perform HEP with less frequency and scope than recommended              SLP Education - 03/25/21 1113     Education Details rationale for HEP as prescribed    Person(s) Educated Patient    Methods Explanation    Comprehension Verbalized understanding             SLP Short Term Goals - 02-25-21        SLP SHORT TERM GOAL #1      Title pt will complete HEP with rare min A    Time      Period --   vists, for all STGs    Status Achieved           SLP SHORT TERM GOAL #2    Title pt will tell SLP why pt is completing HEP with modified independence    Time      Status Achieved           SLP SHORT TERM GOAL #3    Title pt will describe 3 overt s/s aspiration PNA with modified independence    Time      Status Achieved           SLP SHORT TERM GOAL #4    Title pt will tell SLP how a food journal could hasten return to a more normalized diet    Time      Status  Achieved                    SLP Long Term Goals - 02-25-21                      SLP LONG TERM GOAL #1    Title pt will complete HEP with modified independence over two visits    Time 1    Period --   visits, for all LTGs    Status Ongoing           SLP LONG TERM GOAL #2    Title pt will describe how to modify HEP over time, and the timeline associated with reduction in HEP frequency with modified independence over two sessions    Time 2    Status Ongoing                    Plan - 03-25-21      Clinical Impression Statement At this time pt swallowing is deemed WNL/WFL with regular diet (as tolerated) and thin liquids using effortful swallow. SLP reviewed pt's individualized HEP for dysphagia and pt completed each exercise on their own independently. There are no overt s/s aspiration reported by pt at this time. Data  indicate that pt's swallow ability could decline over time following conclusion of their radiation therapy due to muscle disuse atrophy and/or muscle fibrosis. Currently pt cont not performing HEP to recommended frequency/ with recommended scope  and concern exists for muscle fibrosis. SLP voiced these concerns to pt today in session.  *Pt will cont to need to be seen by SLP in order to assess safety of PO intake, assess the need for recommending any objective swallow assessment, and ensuring pt correctly completes the individualized HEP.* Pt will decr ST frequency due to independent with HEP procedure and WNL/WFL swallow ability.    Speech Therapy Frequency --   once approx every eight weeks    Duration --   90 days for this reporting period; overall, approx 7 visits    Treatment/Interventions Aspiration precaution training;Pharyngeal strengthening exercises;Diet toleration management by SLP;Trials of upgraded texture/liquids;Patient/family education;SLP instruction and feedback;Compensatory techniques    Potential to Achieve Goals Good    SLP Home Exercise Plan provided    Consulted and Agree with Plan of Care Patient           Patient will benefit from skilled therapeutic intervention in order to improve the following deficits and impairments:   Dysphagia, unspecified type    Problem List Patient Active Problem List   Diagnosis Date Noted   History of radiation to head and neck region 03/07/2021   Xerostomia due to radiotherapy 03/07/2021   Loss of weight 03/07/2021   Malignant neoplasm of base of tongue ([place]) 11/01/2020   Metastatic cancer to cervical lymph nodes ([place]) 10/23/2020   Metastatic squamous cell carcinoma to lymph node ([place]) 09/13/2020   Osteoarthritis, knee 08/29/2019   OSA (obstructive sleep apnea) 02/26/2019   Osteoarthritis of left shoulder 09/29/2018   History of left shoulder replacement 09/29/2018   Diabetes mellitus, type 2 ([place]) 07/29/2015   Essential hypertension 04/29/2015   Hyperlipidemia 04/29/2015   Morbid obesity ([place]) 04/29/2015    [place], [place] 03/25/2021, 11:14 AM  [facility] [address], [place], [place], [address] Phone:  [phone]   Fax:  [phone]   Name: SHERMAINE BRIGHAM MRN: [id number] Date of Birth: 03-09-1960

## 2021-04-23 ENCOUNTER — Encounter (HOSPITAL_COMMUNITY): Payer: Self-pay

## 2021-04-23 ENCOUNTER — Ambulatory Visit (HOSPITAL_COMMUNITY)
Admission: RE | Admit: 2021-04-23 | Discharge: 2021-04-23 | Disposition: A | Discharge status: Home / Self Care | Payer: BC Managed Care – PPO | Source: Ambulatory Visit | Attending: Radiation Oncology | Admitting: Radiation Oncology

## 2021-04-23 ENCOUNTER — Other Ambulatory Visit: Payer: Self-pay

## 2021-04-23 DIAGNOSIS — Z85818 Personal history of malignant neoplasm of other sites of lip, oral cavity, and pharynx: Secondary | ICD-10-CM | POA: Diagnosis not present

## 2021-04-23 DIAGNOSIS — I6529 Occlusion and stenosis of unspecified carotid artery: Secondary | ICD-10-CM | POA: Diagnosis not present

## 2021-04-23 DIAGNOSIS — C01 Malignant neoplasm of base of tongue: Secondary | ICD-10-CM | POA: Insufficient documentation

## 2021-04-23 DIAGNOSIS — R918 Other nonspecific abnormal finding of lung field: Secondary | ICD-10-CM | POA: Diagnosis not present

## 2021-04-23 DIAGNOSIS — I700 Atherosclerosis of aorta: Secondary | ICD-10-CM | POA: Diagnosis not present

## 2021-04-23 DIAGNOSIS — Z9889 Other specified postprocedural states: Secondary | ICD-10-CM | POA: Diagnosis not present

## 2021-04-23 DIAGNOSIS — R911 Solitary pulmonary nodule: Secondary | ICD-10-CM | POA: Diagnosis not present

## 2021-04-23 DIAGNOSIS — M47812 Spondylosis without myelopathy or radiculopathy, cervical region: Secondary | ICD-10-CM | POA: Diagnosis not present

## 2021-04-23 LAB — POCT I-STAT CREATININE: Creatinine, Ser: 0.9 mg/dL (ref 0.61–1.24)

## 2021-04-23 MED ORDER — IOHEXOL 300 MG/ML  SOLN
75.0000 mL | Freq: Once | INTRAMUSCULAR | Status: AC | PRN
  Administered 2021-04-23: 75 mL via INTRAVENOUS

## 2021-04-23 MED ORDER — SODIUM CHLORIDE (PF) 0.9 % IJ SOLN
INTRAMUSCULAR | Status: AC
  Filled 2021-04-23: qty 50

## 2021-04-24 ENCOUNTER — Other Ambulatory Visit: Payer: Self-pay | Admitting: Family

## 2021-04-24 DIAGNOSIS — E1165 Type 2 diabetes mellitus with hyperglycemia: Secondary | ICD-10-CM

## 2021-04-30 ENCOUNTER — Other Ambulatory Visit: Payer: Self-pay

## 2021-04-30 ENCOUNTER — Ambulatory Visit
Admission: RE | Admit: 2021-04-30 | Discharge: 2021-04-30 | Disposition: A | Discharge status: Home / Self Care | Payer: BC Managed Care – PPO | Source: Ambulatory Visit | Attending: Radiation Oncology | Admitting: Radiation Oncology

## 2021-04-30 VITALS — BP 126/83 | HR 67 | Temp 97.9°F | Resp 20 | Ht 70.0 in | Wt 230.0 lb

## 2021-04-30 DIAGNOSIS — C01 Malignant neoplasm of base of tongue: Secondary | ICD-10-CM

## 2021-04-30 DIAGNOSIS — I719 Aortic aneurysm of unspecified site, without rupture: Secondary | ICD-10-CM | POA: Insufficient documentation

## 2021-04-30 DIAGNOSIS — C770 Secondary and unspecified malignant neoplasm of lymph nodes of head, face and neck: Secondary | ICD-10-CM

## 2021-04-30 DIAGNOSIS — R918 Other nonspecific abnormal finding of lung field: Secondary | ICD-10-CM | POA: Insufficient documentation

## 2021-04-30 DIAGNOSIS — Z923 Personal history of irradiation: Secondary | ICD-10-CM | POA: Insufficient documentation

## 2021-04-30 DIAGNOSIS — Z79899 Other long term (current) drug therapy: Secondary | ICD-10-CM | POA: Diagnosis not present

## 2021-04-30 DIAGNOSIS — Z7985 Long-term (current) use of injectable non-insulin antidiabetic drugs: Secondary | ICD-10-CM | POA: Diagnosis not present

## 2021-04-30 DIAGNOSIS — I700 Atherosclerosis of aorta: Secondary | ICD-10-CM | POA: Diagnosis not present

## 2021-04-30 DIAGNOSIS — Z85810 Personal history of malignant neoplasm of tongue: Secondary | ICD-10-CM | POA: Diagnosis not present

## 2021-04-30 DIAGNOSIS — Z7984 Long term (current) use of oral hypoglycemic drugs: Secondary | ICD-10-CM | POA: Diagnosis not present

## 2021-04-30 DIAGNOSIS — Z7982 Long term (current) use of aspirin: Secondary | ICD-10-CM | POA: Diagnosis not present

## 2021-04-30 DIAGNOSIS — I890 Lymphedema, not elsewhere classified: Secondary | ICD-10-CM | POA: Diagnosis not present

## 2021-04-30 DIAGNOSIS — M47812 Spondylosis without myelopathy or radiculopathy, cervical region: Secondary | ICD-10-CM | POA: Insufficient documentation

## 2021-04-30 DIAGNOSIS — Z08 Encounter for follow-up examination after completed treatment for malignant neoplasm: Secondary | ICD-10-CM | POA: Diagnosis not present

## 2021-04-30 NOTE — Progress Notes (Signed)
Gregory Carey presents today for follow-up after completing radiation to his oropharynx on 01/16/2021 ? ?Pain issues, if any: Denies any pain to mouth or throat. Does report lingering soreness/stiffness to his neck, but states it's manageable (and doesn't interfere with his range of motion) ?Using a feeding tube?: N/A ?Weight changes, if any:  ?Wt Readings from Last 3 Encounters:  ?04/30/21 230 lb (104.3 kg)  ?03/07/21 233 lb (105.7 kg)  ?02/05/21 239 lb 9.6 oz (108.7 kg)  ? ?Swallowing issues, if any: Denies aslong as he chew solid food thorougly and has something to drink while he's eating ?Smoking or chewing tobacco? None ?Using fluoride trays daily? N/A--denies any new dental concerns or oral sores, and reports he has resumed seeing his community dentist regularly  ?Last ENT visit was on: 01/31/2021 Saw Dr. Izora Gala  ?"--Physical Exam:  ?Healthy-appearing gentleman in no distress. Breathing and voice are clear. Nasal exam is unremarkable. Oral cavity and pharynx reveals no mucosal masses identified. Tonsils are absent. Indirect exam of the oropharynx hypopharynx and larynx reveals no mucosal lesions. Typical supraglottic edema. No palpable neck masses. Scar has healed nicely ?--Impression & Plans:  ?Stable, no evidence of persistent disease. Follow-up 3 months or sooner as needed" ? ?Patient reports he's scheduled to see Dr. Constance Holster again at the end of this month ? ?Other notable issues, if any: Denies any ear or jaw pain, and is diligent about doing his trismus exercises. Reports occasional lymphedema (usually first thing in the monring), but states it resolves by the end of the day as he massages the area. Reports altered sense of taste is slowly returning to baseline. Overall, reports he feels well and is pleased with his continued progress ? ? ? ?

## 2021-05-01 ENCOUNTER — Encounter: Payer: Self-pay | Admitting: Radiation Oncology

## 2021-05-01 DIAGNOSIS — L570 Actinic keratosis: Secondary | ICD-10-CM | POA: Diagnosis not present

## 2021-05-01 DIAGNOSIS — L814 Other melanin hyperpigmentation: Secondary | ICD-10-CM | POA: Diagnosis not present

## 2021-05-01 DIAGNOSIS — L738 Other specified follicular disorders: Secondary | ICD-10-CM | POA: Diagnosis not present

## 2021-05-01 DIAGNOSIS — D225 Melanocytic nevi of trunk: Secondary | ICD-10-CM | POA: Diagnosis not present

## 2021-05-01 DIAGNOSIS — L821 Other seborrheic keratosis: Secondary | ICD-10-CM | POA: Diagnosis not present

## 2021-05-01 NOTE — Progress Notes (Signed)
?Radiation Oncology         [phone] ?________________________________ ? ?Name: TIGHE GITTO MRN: [id number]  ?Date: 04/30/2021  DOB: 06-Jun-1960 ? ?Follow-Up Visit Note ? ?CC: Sharion Balloon, FNP  Izora Gala, MD ? ?Diagnosis and Prior Radiotherapy:     ?  ICD-10-CM   ?1. Metastatic cancer to cervical lymph nodes ([place])  C77.0   ?  ?2. Malignant neoplasm of base of tongue ([facility])  C01   ?  ? ? Cancer Staging  ?Metastatic squamous cell carcinoma to lymph node ([place]) ?Staging form: Pharynx - HPV-Mediated Oropharynx, AJCC 8th Edition ?- Clinical stage from 11/01/2020: Stage I (cT1, cN1, cM0, p16+) - Signed by Eppie Gibson, MD on 11/01/2020 ?Stage prefix: Initial diagnosis ? ? ?CHIEF COMPLAINT:  Here for follow-up and surveillance of throat cancer ? ?Narrative:   Mr. Gregory Carey presents today for follow-up after completing radiation to his oropharynx on 01/16/2021 ? ?Pain issues, if any: Denies any pain to mouth or throat. Does report lingering soreness/stiffness to his neck, but states it's manageable (and doesn't interfere with his range of motion) ?Using a feeding tube?: N/A ?Weight changes, if any:  ?Wt Readings from Last 3 Encounters:  ?04/30/21 230 lb (104.3 kg)  ?03/07/21 233 lb (105.7 kg)  ?02/05/21 239 lb 9.6 oz (108.7 kg)  ? ?Swallowing issues, if any: Denies aslong as he chew solid food thorougly and has something to drink while he's eating ?Smoking or chewing tobacco? None ?Using fluoride trays daily? N/A--denies any new dental concerns or oral sores, and reports he has resumed seeing his community dentist regularly  ?Last ENT visit was on: 01/31/2021 Saw Dr. Izora Gala  ?"--Physical Exam:  ?Healthy-appearing gentleman in no distress. Breathing and voice are clear. Nasal exam is unremarkable. Oral cavity and pharynx reveals no mucosal masses identified. Tonsils are absent. Indirect exam of the oropharynx hypopharynx and larynx reveals no mucosal lesions. Typical supraglottic edema. No palpable neck masses.  Scar has healed nicely ?--Impression & Plans:  ?Stable, no evidence of persistent disease. Follow-up 3 months or sooner as needed" ? ?Patient reports he's scheduled to see Dr. Constance Holster again at the end of this month ? ?Other notable issues, if any: Denies any ear or jaw pain, and is diligent about doing his trismus exercises. Reports occasional lymphedema (usually first thing in the monring), but states it resolves by the end of the day as he massages the area. Reports altered sense of taste is slowly returning to baseline. Overall, reports he feels well and is pleased with his continued progress ? ?                    ? ?ALLERGIES:  has No Known Allergies. ? ?Meds: ?Current Outpatient Medications  ?Medication Sig Dispense Refill  ? amLODipine (NORVASC) 5 MG tablet Take 1 tablet (5 mg total) by mouth daily. 90 tablet 3  ? aspirin EC 81 MG tablet Take 1 tablet (81 mg total) by mouth daily. 90 tablet 1  ? blood glucose meter kit and supplies Dispense based on patient and insurance preference. Use up to four times daily as directed. (FOR ICD-9 250.00, 250.01). 1 each 0  ? busPIRone (BUSPAR) 5 MG tablet Take 1 tablet (5 mg total) by mouth 3 (three) times daily as needed. 90 tablet 1  ? CONTOUR NEXT TEST test strip USE AS DIRECTED TWICE DAILY 100 each 0  ? Dulaglutide (TRULICITY) 8.76 OT/1.5BW SOPN INJECT 0.75 MG  SUBCUTANEOUSLY ONCE A WEEK 6 mL  0  ? lisinopril-hydrochlorothiazide (ZESTORETIC) 20-12.5 MG tablet Take 2 tablets by mouth daily. 180 tablet 2  ? metFORMIN (GLUCOPHAGE-XR) 750 MG 24 hr tablet Take 1 tablet by mouth once daily with breakfast 90 tablet 0  ? rosuvastatin (CRESTOR) 20 MG tablet Take 1 tablet (20 mg total) by mouth daily. 90 tablet 3  ? sodium fluoride (SODIUM FLUORIDE 5000 PPM) 1.1 % GEL dental gel Place 1 pea-size drop into each tooth space of fluoride trays once a day at bedtime.  Leave trays in for 5 minutes and then remove.  Spit out excess fluoride, but DO NOT rinse with water, eat or drink for  at least 30 minutes after use. 120 mL 11  ? ?No current facility-administered medications for this encounter.  ? ? ?Physical Findings: ?The patient is in no acute distress. Patient is alert and oriented. ?Wt Readings from Last 3 Encounters:  ?04/30/21 230 lb (104.3 kg)  ?03/07/21 233 lb (105.7 kg)  ?02/05/21 239 lb 9.6 oz (108.7 kg)  ? ? height is 5' 10"  (1.778 m) and weight is 230 lb (104.3 kg). His temperature is 97.9 ?F (36.6 ?C). His blood pressure is 126/83 and his pulse is 67. His respiration is 20 and oxygen saturation is 98%. Marland Kitchen  ?General: Alert and oriented, in no acute distress ?HEENT: Head is normocephalic. Extraocular movements are intact. Oropharynx is notable for no lesions ?Neck: Neck is notable for no masses ?Skin: Skin in treatment fields shows satisfactory healing  ?Lymphatics: see Neck Exam ?Psychiatric: Judgment and insight are intact. Affect is appropriate. ?HEART RRR ?CHEST CTAB ? ?Lab Findings: ?Lab Results  ?Component Value Date  ? WBC 6.0 01/29/2021  ? HGB 15.2 01/29/2021  ? HCT 43.9 01/29/2021  ? MCV 86 01/29/2021  ? PLT 252 01/29/2021  ? ? ?Lab Results  ?Component Value Date  ? TSH 0.538 11/20/2020  ? ? ?Radiographic Findings: ?CT Soft Tissue Neck W Contrast ? ?Result Date: 04/23/2021 ?CLINICAL DATA:  History of metastatic squamous cell carcinoma of oropharynx, radiation completed 01/16/2021. EXAM: CT NECK WITH CONTRAST TECHNIQUE: Multidetector CT imaging of the neck was performed using the standard protocol following the bolus administration of intravenous contrast. RADIATION DOSE REDUCTION: This exam was performed according to the departmental dose-optimization program which includes automated exposure control, adjustment of the mA and/or kV according to patient size and/or use of iterative reconstruction technique. CONTRAST:  11m OMNIPAQUE IOHEXOL 300 MG/ML  SOLN COMPARISON:  CT neck 09/11/2020 FINDINGS: Pharynx and larynx: The nasopharynx and nasal cavity are unremarkable. No suspicious  lesions or enhancement is seen in the oral cavity or oropharynx. There is mild prominence of the lingual tonsils with no discrete lesion identified. There is mild edema in the oropharyngeal mucosa and epiglottis, favored to reflect posttreatment change. There is a small amount of fluid in the left retropharynx. The vocal folds are unremarkable in appearance. Salivary glands: The parotid glands are unremarkable. The left submandibular gland is surgically absent. The right submandibular gland is unremarkable. Thyroid: Unremarkable. Lymph nodes: Previously seen enlarged left level II lymph nodes are no longer identified. There is no pathologic lymphadenopathy in the neck on the current study. Vascular: The left internal jugular vein is now surgically absent. There is minimal calcified plaque in the carotid bulbs. The major vessels of the neck are otherwise unremarkable. Limited intracranial: The imaged intracranial compartment is unremarkable. Visualized orbits: The imaged globes and orbits are unremarkable. Mastoids and visualized paranasal sinuses: The imaged paranasal sinuses and mastoid air cells are  clear. Skeleton: There is multilevel degenerative change of the cervical spine, most advanced at C4-C5 and C6-C7. A sclerotic lesion in the right aspect of the C2 vertebral body is unchanged, most likely reflecting a bone island. There is no acute osseous abnormality or aggressive osseous lesion. Upper chest: The lungs are assessed on the separately dictated CT chest. Other: Postsurgical changes reflecting left neck dissection are seen. There is soft tissue thickening throughout the left neck with mild stranding in the subcutaneous fat. IMPRESSION: 1. Postsurgical changes reflecting interval left neck dissection. Soft tissue thickening and stranding in the subcutaneous fat throughout the left neck is favored to reflect postsurgical/postradiation change. Additionally, oropharyngeal/retropharyngeal edema and edema of the  epiglottis is favored to reflect posttreatment change. No suspicious lesion or enhancement. 2. No pathologic lymphadenopathy in the neck. Electronically Signed   By: Valetta Mole M.D.   On: 04/23/2021

## 2021-05-05 ENCOUNTER — Other Ambulatory Visit: Payer: Self-pay

## 2021-05-05 DIAGNOSIS — Z1329 Encounter for screening for other suspected endocrine disorder: Secondary | ICD-10-CM

## 2021-05-15 DIAGNOSIS — Z923 Personal history of irradiation: Secondary | ICD-10-CM | POA: Diagnosis not present

## 2021-05-15 DIAGNOSIS — C109 Malignant neoplasm of oropharynx, unspecified: Secondary | ICD-10-CM | POA: Diagnosis not present

## 2021-05-19 ENCOUNTER — Ambulatory Visit: Payer: BC Managed Care – PPO

## 2021-06-17 DIAGNOSIS — Z85828 Personal history of other malignant neoplasm of skin: Secondary | ICD-10-CM | POA: Diagnosis not present

## 2021-06-17 DIAGNOSIS — L578 Other skin changes due to chronic exposure to nonionizing radiation: Secondary | ICD-10-CM | POA: Diagnosis not present

## 2021-06-17 DIAGNOSIS — Z08 Encounter for follow-up examination after completed treatment for malignant neoplasm: Secondary | ICD-10-CM | POA: Diagnosis not present

## 2021-07-17 ENCOUNTER — Other Ambulatory Visit: Payer: Self-pay | Admitting: Family

## 2021-07-17 DIAGNOSIS — I10 Essential (primary) hypertension: Secondary | ICD-10-CM

## 2021-08-06 ENCOUNTER — Other Ambulatory Visit: Payer: Self-pay | Admitting: Family

## 2021-08-06 DIAGNOSIS — E1165 Type 2 diabetes mellitus with hyperglycemia: Secondary | ICD-10-CM

## 2021-08-07 ENCOUNTER — Other Ambulatory Visit: Payer: Self-pay | Admitting: Family

## 2021-08-07 DIAGNOSIS — I10 Essential (primary) hypertension: Secondary | ICD-10-CM

## 2021-08-07 NOTE — Telephone Encounter (Signed)
[place]. NTBS 30 days given 07/17/21

## 2021-08-27 ENCOUNTER — Other Ambulatory Visit: Payer: Self-pay | Admitting: Family

## 2021-08-27 DIAGNOSIS — E1165 Type 2 diabetes mellitus with hyperglycemia: Secondary | ICD-10-CM

## 2021-09-05 ENCOUNTER — Encounter: Payer: Self-pay | Admitting: Family

## 2021-09-05 ENCOUNTER — Ambulatory Visit (INDEPENDENT_AMBULATORY_CARE_PROVIDER_SITE_OTHER): Payer: BC Managed Care – PPO | Admitting: Family

## 2021-09-05 VITALS — BP 102/64 | HR 62 | Temp 97.9°F | Ht 70.0 in | Wt 229.0 lb

## 2021-09-05 DIAGNOSIS — G4733 Obstructive sleep apnea (adult) (pediatric): Secondary | ICD-10-CM

## 2021-09-05 DIAGNOSIS — I10 Essential (primary) hypertension: Secondary | ICD-10-CM | POA: Diagnosis not present

## 2021-09-05 DIAGNOSIS — C01 Malignant neoplasm of base of tongue: Secondary | ICD-10-CM | POA: Diagnosis not present

## 2021-09-05 DIAGNOSIS — E1169 Type 2 diabetes mellitus with other specified complication: Secondary | ICD-10-CM

## 2021-09-05 DIAGNOSIS — I959 Hypotension, unspecified: Secondary | ICD-10-CM | POA: Insufficient documentation

## 2021-09-05 DIAGNOSIS — M1711 Unilateral primary osteoarthritis, right knee: Secondary | ICD-10-CM

## 2021-09-05 DIAGNOSIS — Z794 Long term (current) use of insulin: Secondary | ICD-10-CM | POA: Diagnosis not present

## 2021-09-05 DIAGNOSIS — E785 Hyperlipidemia, unspecified: Secondary | ICD-10-CM

## 2021-09-05 DIAGNOSIS — E669 Obesity, unspecified: Secondary | ICD-10-CM

## 2021-09-05 LAB — BAYER DCA HB A1C WAIVED: HB A1C (BAYER DCA - WAIVED): 5.7 % — ABNORMAL HIGH (ref 4.8–5.6)

## 2021-09-05 NOTE — Progress Notes (Signed)
Subjective:    Patient ID: Gregory Carey, male    DOB: 03-May-1960, 61 y.o.   MRN: [id number]  Chief Complaint  Patient presents with   Medication Refill   Pt presents today for chronic follow up. He has metastatic squamous neck cancer. He had surgery on 10/23/20 for laryngoscopy and biopsy. He completed radiation. He is followed by Oncologists. He has lost 50 lbs.   He has OSA and uses CPAP nightly.  Hypertension This is a chronic problem. The current episode started more than 1 year ago. The problem has been resolved since onset. The problem is controlled. Pertinent negatives include no blurred vision, malaise/fatigue, peripheral edema or shortness of breath. Risk factors for coronary artery disease include dyslipidemia, diabetes mellitus, obesity and male gender. The current treatment provides moderate improvement.  Diabetes He presents for his follow-up diabetic visit. He has type 2 diabetes mellitus. Pertinent negatives for diabetes include no blurred vision and no foot paresthesias. Symptoms are stable. Risk factors for coronary artery disease include dyslipidemia, diabetes mellitus and hypertension. He is following a generally healthy diet. His overall blood glucose range is 90-110 mg/dl.  Arthritis Presents for follow-up visit. He complains of pain and stiffness. The symptoms have been stable. Affected locations include the left knee and right knee. His pain is at a severity of 3/10.  Hyperlipidemia This is a chronic problem. The current episode started more than 1 year ago. The problem is controlled. Recent lipid tests were reviewed and are normal. Exacerbating diseases include obesity. Pertinent negatives include no shortness of breath. Current antihyperlipidemic treatment includes statins. The current treatment provides moderate improvement of lipids. Risk factors for coronary artery disease include dyslipidemia, male sex, hypertension and a sedentary lifestyle.      Review of  Systems  Constitutional:  Negative for malaise/fatigue.  Eyes:  Negative for blurred vision.  Respiratory:  Negative for shortness of breath.   Musculoskeletal:  Positive for arthritis and stiffness.  All other systems reviewed and are negative.      Objective:   Physical Exam Vitals reviewed.  Constitutional:      General: He is not in acute distress.    Appearance: He is well-developed. He is obese.  HENT:     Head: Normocephalic.     Right Ear: Tympanic membrane normal.     Left Ear: Tympanic membrane normal.  Eyes:     General:        Right eye: No discharge.        Left eye: No discharge.     Pupils: Pupils are equal, round, and reactive to light.  Neck:     Thyroid: No thyromegaly.  Cardiovascular:     Rate and Rhythm: Normal rate and regular rhythm.     Heart sounds: Normal heart sounds. No murmur heard. Pulmonary:     Effort: Pulmonary effort is normal. No respiratory distress.     Breath sounds: Normal breath sounds. No wheezing.  Abdominal:     General: Bowel sounds are normal. There is no distension.     Palpations: Abdomen is soft.     Tenderness: There is no abdominal tenderness.  Musculoskeletal:        General: No tenderness. Normal range of motion.     Cervical back: Normal range of motion and neck supple.  Skin:    General: Skin is warm and dry.     Findings: No erythema or rash.  Neurological:     Mental Status: He is alert  and oriented to person, place, and time.     Cranial Nerves: No cranial nerve deficit.     Deep Tendon Reflexes: Reflexes are normal and symmetric.  Psychiatric:        Behavior: Behavior normal.        Thought Content: Thought content normal.        Judgment: Judgment normal.     BP 102/64   Pulse 62   Temp 97.9 F (36.6 C)   Ht 5' 10"  (1.778 m)   Wt 229 lb (103.9 kg)   SpO2 98%   BMI 32.86 kg/m      Assessment & Plan:  [name] comes in today with chief complaint of Medication Refill   Diagnosis and  orders addressed:  1. Essential hypertension - CMP14+EGFR - CBC with Differential/Platelet  2. Malignant neoplasm of base of tongue ([facility]) - CMP14+EGFR - CBC with Differential/Platelet  3. OSA (obstructive sleep apnea) - CMP14+EGFR - CBC with Differential/Platelet  4. Type 2 diabetes mellitus with other specified complication, with long-term current use of insulin ([facility]) - Bayer DCA Hb A1c Waived - CMP14+EGFR - CBC with Differential/Platelet - Microalbumin / creatinine urine ratio  5. Obesity (BMI 30-39.9) - CMP14+EGFR - CBC with Differential/Platelet  6. Hyperlipidemia, unspecified hyperlipidemia type - CMP14+EGFR - CBC with Differential/Platelet  7. Primary osteoarthritis of right knee - CMP14+EGFR - CBC with Differential/Platelet  8. Hypotension, unspecified hypotension type Stop Norvasc 5 mg Follow up in 1 month   Labs pending Health Maintenance reviewed Diet and exercise encouraged  Follow up plan: 1 month to recheck BP after stopping Norvasc 5 mg  Evelina Dun, FNP

## 2021-09-05 NOTE — Patient Instructions (Signed)
Health Maintenance, Male Adopting a healthy lifestyle and getting preventive care are important in promoting health and wellness. Ask your health care provider about: The right schedule for you to have regular tests and exams. Things you can do on your own to prevent diseases and keep yourself healthy. What should I know about diet, weight, and exercise? Eat a healthy diet  Eat a diet that includes plenty of vegetables, fruits, low-fat dairy products, and lean protein. Do not eat a lot of foods that are high in solid fats, added sugars, or sodium. Maintain a healthy weight Body mass index (BMI) is a measurement that can be used to identify possible weight problems. It estimates body fat based on height and weight. Your health care provider can help determine your BMI and help you achieve or maintain a healthy weight. Get regular exercise Get regular exercise. This is one of the most important things you can do for your health. Most adults should: Exercise for at least 150 minutes each week. The exercise should increase your heart rate and make you sweat (moderate-intensity exercise). Do strengthening exercises at least twice a week. This is in addition to the moderate-intensity exercise. Spend less time sitting. Even light physical activity can be beneficial. Watch cholesterol and blood lipids Have your blood tested for lipids and cholesterol at 60 years of age, then have this test every 5 years. You may need to have your cholesterol levels checked more often if: Your lipid or cholesterol levels are high. You are older than 60 years of age. You are at high risk for heart disease. What should I know about cancer screening? Many types of cancers can be detected early and may often be prevented. Depending on your health history and family history, you may need to have cancer screening at various ages. This may include screening for: Colorectal cancer. Prostate cancer. Skin cancer. Lung  cancer. What should I know about heart disease, diabetes, and high blood pressure? Blood pressure and heart disease High blood pressure causes heart disease and increases the risk of stroke. This is more likely to develop in people who have high blood pressure readings or are overweight. Talk with your health care provider about your target blood pressure readings. Have your blood pressure checked: Every 3-5 years if you are 18-39 years of age. Every year if you are 40 years old or older. If you are between the ages of 65 and 75 and are a current or former smoker, ask your health care provider if you should have a one-time screening for abdominal aortic aneurysm (AAA). Diabetes Have regular diabetes screenings. This checks your fasting blood sugar level. Have the screening done: Once every three years after age 45 if you are at a normal weight and have a low risk for diabetes. More often and at a younger age if you are overweight or have a high risk for diabetes. What should I know about preventing infection? Hepatitis B If you have a higher risk for hepatitis B, you should be screened for this virus. Talk with your health care provider to find out if you are at risk for hepatitis B infection. Hepatitis C Blood testing is recommended for: Everyone born from 1945 through 1965. Anyone with known risk factors for hepatitis C. Sexually transmitted infections (STIs) You should be screened each year for STIs, including gonorrhea and chlamydia, if: You are sexually active and are younger than 60 years of age. You are older than 60 years of age and your   health care provider tells you that you are at risk for this type of infection. Your sexual activity has changed since you were last screened, and you are at increased risk for chlamydia or gonorrhea. Ask your health care provider if you are at risk. Ask your health care provider about whether you are at high risk for HIV. Your health care provider  may recommend a prescription medicine to help prevent HIV infection. If you choose to take medicine to prevent HIV, you should first get tested for HIV. You should then be tested every 3 months for as long as you are taking the medicine. Follow these instructions at home: Alcohol use Do not drink alcohol if your health care provider tells you not to drink. If you drink alcohol: Limit how much you have to 0-2 drinks a day. Know how much alcohol is in your drink. In the [place] one drink equals one 12 oz bottle of beer (355 mL), one 5 oz glass of wine (148 mL), or one 1 oz glass of hard liquor (44 mL). Lifestyle Do not use any products that contain nicotine or tobacco. These products include cigarettes, chewing tobacco, and vaping devices, such as e-cigarettes. If you need help quitting, ask your health care provider. Do not use street drugs. Do not share needles. Ask your health care provider for help if you need support or information about quitting drugs. General instructions Schedule regular health, dental, and eye exams. Stay current with your vaccines. Tell your health care provider if: You often feel depressed. You have ever been abused or do not feel safe at home. Summary Adopting a healthy lifestyle and getting preventive care are important in promoting health and wellness. Follow your health care provider's instructions about healthy diet, exercising, and getting tested or screened for diseases. Follow your health care provider's instructions on monitoring your cholesterol and blood pressure. This information is not intended to replace advice given to you by your health care provider. Make sure you discuss any questions you have with your health care provider. Document Revised: 06/24/2020 Document Reviewed: 06/24/2020 Elsevier Patient Education  2023 Elsevier Inc.  

## 2021-09-06 LAB — CMP14+EGFR
ALT: 12 IU/L (ref 0–44)
AST: 18 IU/L (ref 0–40)
Albumin/Globulin Ratio: 2 (ref 1.2–2.2)
Albumin: 4.5 g/dL (ref 3.8–4.9)
Alkaline Phosphatase: 66 IU/L (ref 44–121)
BUN/Creatinine Ratio: 13 (ref 9–20)
BUN: 14 mg/dL (ref 6–24)
Bilirubin Total: 0.5 mg/dL (ref 0.0–1.2)
CO2: 28 mmol/L (ref 20–29)
Calcium: 10.3 mg/dL — ABNORMAL HIGH (ref 8.7–10.2)
Chloride: 104 mmol/L (ref 96–106)
Creatinine, Ser: 1.05 mg/dL (ref 0.76–1.27)
Globulin, Total: 2.3 g/dL (ref 1.5–4.5)
Glucose: 102 mg/dL — ABNORMAL HIGH (ref 70–99)
Potassium: 4.4 mmol/L (ref 3.5–5.2)
Sodium: 145 mmol/L — ABNORMAL HIGH (ref 134–144)
Total Protein: 6.8 g/dL (ref 6.0–8.5)
eGFR: 82 mL/min/{1.73_m2} (ref >59)

## 2021-09-06 LAB — CBC WITH DIFFERENTIAL/PLATELET
Basophils Absolute: 0.1 10*3/uL (ref 0.0–0.2)
Basos: 1 %
EOS (ABSOLUTE): 0.2 10*3/uL (ref 0.0–0.4)
Eos: 3 %
Hematocrit: 45.2 % (ref 37.5–51.0)
Hemoglobin: 15 g/dL (ref 13.0–17.7)
Immature Grans (Abs): 0 10*3/uL (ref 0.0–0.1)
Immature Granulocytes: 0 %
Lymphocytes Absolute: 1.1 10*3/uL (ref 0.7–3.1)
Lymphs: 18 %
MCH: 29.2 pg (ref 26.6–33.0)
MCHC: 33.2 g/dL (ref 31.5–35.7)
MCV: 88 fL (ref 79–97)
Monocytes Absolute: 0.5 10*3/uL (ref 0.1–0.9)
Monocytes: 9 %
Neutrophils Absolute: 4.1 10*3/uL (ref 1.4–7.0)
Neutrophils: 69 %
Platelets: 259 10*3/uL (ref 150–450)
RBC: 5.14 x10E6/uL (ref 4.14–5.80)
RDW: 13.2 % (ref 11.6–15.4)
WBC: 6 10*3/uL (ref 3.4–10.8)

## 2021-09-06 LAB — MICROALBUMIN / CREATININE URINE RATIO
Creatinine, Urine: 108.5 mg/dL
Microalb/Creat Ratio: 6 mg/g creat (ref 0–29)
Microalbumin, Urine: 6.2 ug/mL

## 2021-09-10 ENCOUNTER — Telehealth: Payer: Self-pay

## 2021-09-10 ENCOUNTER — Other Ambulatory Visit: Payer: Self-pay | Admitting: Family

## 2021-09-10 DIAGNOSIS — I10 Essential (primary) hypertension: Secondary | ICD-10-CM

## 2021-09-10 NOTE — Chronic Care Management (AMB) (Signed)
  Care Coordination  Note  09/10/2021 Name: Gregory Carey MRN: [id number] DOB: [birth year]  Gregory Carey is a 61 y.o. year old male who is a primary care patient of Sharion Balloon, FNP. I reached out to Irene Limbo by phone today to offer care coordination services.      Mr. Payeur was given information about Care Coordination services today including:  The Care Coordination services include support from the care team which includes your Nurse Coordinator, Clinical Social Worker, or Pharmacist.  The Care Coordination team is here to help remove barriers to the health concerns and goals most important to you. Care Coordination services are voluntary and the patient may decline or stop services at any time by request to their care team member.   Patient did not agree to participate in care coordination services at this time.  Follow up plan: Patient declines further follow up or participation in care coordination services.   Noreene Larsson, [place], [place] [address] Direct Dial: [phone] [email or web address]

## 2021-10-06 ENCOUNTER — Ambulatory Visit (INDEPENDENT_AMBULATORY_CARE_PROVIDER_SITE_OTHER): Payer: BC Managed Care – PPO | Admitting: Family

## 2021-10-06 ENCOUNTER — Encounter: Payer: Self-pay | Admitting: Family

## 2021-10-06 VITALS — BP 120/81 | HR 67 | Temp 97.6°F | Ht 70.0 in | Wt 231.0 lb

## 2021-10-06 DIAGNOSIS — E785 Hyperlipidemia, unspecified: Secondary | ICD-10-CM

## 2021-10-06 DIAGNOSIS — Z794 Long term (current) use of insulin: Secondary | ICD-10-CM

## 2021-10-06 DIAGNOSIS — F411 Generalized anxiety disorder: Secondary | ICD-10-CM | POA: Insufficient documentation

## 2021-10-06 DIAGNOSIS — Z23 Encounter for immunization: Secondary | ICD-10-CM

## 2021-10-06 DIAGNOSIS — E1165 Type 2 diabetes mellitus with hyperglycemia: Secondary | ICD-10-CM

## 2021-10-06 DIAGNOSIS — I10 Essential (primary) hypertension: Secondary | ICD-10-CM

## 2021-10-06 DIAGNOSIS — E669 Obesity, unspecified: Secondary | ICD-10-CM

## 2021-10-06 MED ORDER — BUSPIRONE HCL 5 MG PO TABS: 5.0000 mg | ORAL_TABLET | Freq: Three times a day (TID) | ORAL | 1 refills | Status: DC | PRN

## 2021-10-06 MED ORDER — METFORMIN HCL ER 500 MG PO TB24: 500.0000 mg | ORAL_TABLET | Freq: Every day | ORAL | 1 refills | Status: DC

## 2021-10-06 MED ORDER — ROSUVASTATIN CALCIUM 20 MG PO TABS: 20.0000 mg | ORAL_TABLET | Freq: Every day | ORAL | 3 refills | Status: DC

## 2021-10-06 MED ORDER — LISINOPRIL-HYDROCHLOROTHIAZIDE 20-12.5 MG PO TABS: 2.0000 | ORAL_TABLET | Freq: Every day | ORAL | 0 refills | Status: DC

## 2021-10-06 NOTE — Progress Notes (Signed)
Subjective:    Patient ID: Gregory Carey, male    DOB: 23-May-1960, 61 y.o.   MRN: [id number]  Chief Complaint  Patient presents with   Medical Management of Chronic Issues   PT presents to the office today to follow up HTN. He was seen on 09/05/21 and we stopped his Norvsac 5 mg. His BP today is at goal!  Hypertension This is a chronic problem. The current episode started more than 1 year ago. The problem has been resolved since onset. The problem is controlled. Associated symptoms include anxiety. Pertinent negatives include no blurred vision, malaise/fatigue, peripheral edema or shortness of breath. Past treatments include ACE inhibitors and diuretics. The current treatment provides moderate improvement.  Diabetes He presents for his follow-up diabetic visit. He has type 2 diabetes mellitus. Hypoglycemia symptoms include nervousness/anxiousness. Pertinent negatives for diabetes include no blurred vision and no foot paresthesias. Risk factors for coronary artery disease include diabetes mellitus, dyslipidemia, male sex and hypertension. His overall blood glucose range is 90-110 mg/dl.  Hyperlipidemia This is a chronic problem. The current episode started more than 1 year ago. Exacerbating diseases include obesity. Pertinent negatives include no shortness of breath. Current antihyperlipidemic treatment includes statins. The current treatment provides moderate improvement of lipids. Risk factors for coronary artery disease include dyslipidemia, diabetes mellitus, male sex, hypertension and a sedentary lifestyle.  Anxiety Presents for follow-up visit. Symptoms include excessive worry and nervous/anxious behavior. Patient reports no irritability or shortness of breath.        Review of Systems  Constitutional:  Negative for irritability and malaise/fatigue.  Eyes:  Negative for blurred vision.  Respiratory:  Negative for shortness of breath.   Psychiatric/Behavioral:  The patient is  nervous/anxious.   All other systems reviewed and are negative.      Objective:   Physical Exam Vitals reviewed.  Constitutional:      General: He is not in acute distress.    Appearance: He is well-developed.  HENT:     Head: Normocephalic.     Right Ear: Tympanic membrane normal.     Left Ear: Tympanic membrane normal.  Eyes:     General:        Right eye: No discharge.        Left eye: No discharge.     Pupils: Pupils are equal, round, and reactive to light.  Neck:     Thyroid: No thyromegaly.  Cardiovascular:     Rate and Rhythm: Normal rate and regular rhythm.     Heart sounds: Normal heart sounds. No murmur heard. Pulmonary:     Effort: Pulmonary effort is normal. No respiratory distress.     Breath sounds: Normal breath sounds. No wheezing.  Abdominal:     General: Bowel sounds are normal. There is no distension.     Palpations: Abdomen is soft.     Tenderness: There is no abdominal tenderness.  Musculoskeletal:        General: No tenderness. Normal range of motion.     Cervical back: Normal range of motion and neck supple.  Skin:    General: Skin is warm and dry.     Findings: No erythema or rash.  Neurological:     Mental Status: He is alert and oriented to person, place, and time.     Cranial Nerves: No cranial nerve deficit.     Deep Tendon Reflexes: Reflexes are normal and symmetric.  Psychiatric:        Behavior: Behavior normal.  Thought Content: Thought content normal.        Judgment: Judgment normal.       BP 120/81   Pulse 67   Temp 97.6 F (36.4 C) (Temporal)   Ht '5\' 10"'$  (1.778 m)   Wt 231 lb (104.8 kg)   BMI 33.15 kg/m      Assessment & Plan:  Gregory Carey comes in today with chief complaint of Medical Management of Chronic Issues   Diagnosis and orders addressed:  1. Essential hypertension - lisinopril-hydrochlorothiazide (ZESTORETIC) 20-12.5 MG tablet; Take 2 tablets by mouth daily.  Dispense: 60 tablet; Refill:  0  2. GAD (generalized anxiety disorder) - busPIRone (BUSPAR) 5 MG tablet; Take 1 tablet (5 mg total) by mouth 3 (three) times daily as needed.  Dispense: 90 tablet; Refill: 1  3. Hyperlipidemia, unspecified hyperlipidemia type - rosuvastatin (CRESTOR) 20 MG tablet; Take 1 tablet (20 mg total) by mouth daily.  Dispense: 90 tablet; Refill: 3  4. Type 2 diabetes mellitus with hyperglycemia, with long-term current use of insulin (HCC) Will decrease metformin to 500 mg from 750 mg  Low carb diet  - metFORMIN (GLUCOPHAGE-XR) 500 MG 24 hr tablet; Take 1 tablet (500 mg total) by mouth daily with breakfast.  Dispense: 90 tablet; Refill: 1  5. Obesity (BMI 30-39.9)    Health Maintenance reviewed Diet and exercise encouraged  Follow up plan: 2 months    Evelina Dun, FNP

## 2021-10-22 DIAGNOSIS — D492 Neoplasm of unspecified behavior of bone, soft tissue, and skin: Secondary | ICD-10-CM | POA: Diagnosis not present

## 2021-10-22 DIAGNOSIS — D225 Melanocytic nevi of trunk: Secondary | ICD-10-CM | POA: Diagnosis not present

## 2021-10-22 DIAGNOSIS — Z08 Encounter for follow-up examination after completed treatment for malignant neoplasm: Secondary | ICD-10-CM | POA: Diagnosis not present

## 2021-10-22 DIAGNOSIS — L821 Other seborrheic keratosis: Secondary | ICD-10-CM | POA: Diagnosis not present

## 2021-10-22 DIAGNOSIS — R208 Other disturbances of skin sensation: Secondary | ICD-10-CM | POA: Diagnosis not present

## 2021-10-22 DIAGNOSIS — L570 Actinic keratosis: Secondary | ICD-10-CM | POA: Diagnosis not present

## 2021-10-22 DIAGNOSIS — C4442 Squamous cell carcinoma of skin of scalp and neck: Secondary | ICD-10-CM | POA: Diagnosis not present

## 2021-10-22 DIAGNOSIS — L814 Other melanin hyperpigmentation: Secondary | ICD-10-CM | POA: Diagnosis not present

## 2021-10-30 ENCOUNTER — Telehealth: Payer: Self-pay | Admitting: *Deleted

## 2021-10-30 ENCOUNTER — Other Ambulatory Visit: Payer: Self-pay | Admitting: Family

## 2021-10-30 DIAGNOSIS — I10 Essential (primary) hypertension: Secondary | ICD-10-CM

## 2021-10-30 NOTE — Telephone Encounter (Signed)
CALLED PATIENT TO REMIND OF LAB FOR 10-31-21 @ 1:45 PM, SPOKE WITH PATIENT AND HE IS AWARE OF THIS APPT.

## 2021-10-31 ENCOUNTER — Ambulatory Visit
Admission: RE | Admit: 2021-10-31 | Discharge: 2021-10-31 | Disposition: A | Discharge status: Home / Self Care | Payer: BC Managed Care – PPO | Source: Ambulatory Visit | Attending: Radiation Oncology | Admitting: Radiation Oncology

## 2021-10-31 ENCOUNTER — Encounter: Payer: Self-pay | Admitting: Radiation Oncology

## 2021-10-31 ENCOUNTER — Other Ambulatory Visit: Payer: Self-pay

## 2021-10-31 VITALS — BP 120/76 | HR 65 | Temp 98.4°F | Resp 16 | Wt 232.0 lb

## 2021-10-31 DIAGNOSIS — C779 Secondary and unspecified malignant neoplasm of lymph node, unspecified: Secondary | ICD-10-CM | POA: Diagnosis not present

## 2021-10-31 DIAGNOSIS — C01 Malignant neoplasm of base of tongue: Secondary | ICD-10-CM | POA: Diagnosis not present

## 2021-10-31 DIAGNOSIS — Z1329 Encounter for screening for other suspected endocrine disorder: Secondary | ICD-10-CM | POA: Diagnosis not present

## 2021-10-31 DIAGNOSIS — C770 Secondary and unspecified malignant neoplasm of lymph nodes of head, face and neck: Secondary | ICD-10-CM

## 2021-10-31 LAB — TSH: TSH: 1.278 u[IU]/mL (ref 0.350–4.500)

## 2021-10-31 MED ORDER — OXYMETAZOLINE HCL 0.05 % NA SOLN
1.0000 | Freq: Once | NASAL | Status: AC
  Administered 2021-10-31: 1 via NASAL
  Filled 2021-10-31: qty 30

## 2021-10-31 NOTE — Progress Notes (Signed)
Oncology Nurse Navigator Documentation   I met with Gregory Carey before his appointment with Dr. Isidore Moos. He denies any concerns at this time and is feeling well.   At Dr. Pearlie Oyster request I have sent fax to [facility] Scheduling with request Gregory Carey be contacted and scheduled for routine post-RT follow-up with Dr. Constance Holster in 3 months.  Notification of successful fax transmission received.   Gregory Carey knows to call me for any needs in the future.   Harlow Asa RN, BSN, OCN Head & Neck Oncology Nurse [place] at [facility] Phone # [phone]  Fax # [phone]

## 2021-10-31 NOTE — Progress Notes (Signed)
Gregory Carey presents today for follow-up after completing radiation to his oropharynx on 01/16/2021  Pain issues, if any: no Using a feeding tube?: N/A Weight changes, if any: slowing gaining - has gained 15 lbs since the end of treatment  Swallowing issues, if any: has to be careful and chew a lot when eating. Smoking or chewing tobacco? no Using fluoride trays daily? yes Last ENT visit was on: 05/15/2021 Saw Dr. Izora Gala --Physical Exam:  Healthy-appearing gentleman, voice is clear, breathing is normal. Ears are healthy and clear. Nasal exam is unremarkable. Oral cavity and pharynx reveals healthy dentition. Tongue and floor of mouth look healthy and clear. Pharynx looks clear. Indirect exam of the larynx reveals no mucosal lesions identified. Cords move well. No palpable neck masses. Diffuse fibrosis but mild.  --Impression & Plans:  Stable, no evidence of recurrent disease. Recheck 3 months or sooner as needed.  Other notable issues, if any: Reports having a skin biopsy done last week on the back of his neck and the left side of his neck by Dr. Pearline Cables at [facility].  He hasn't heard the results yet.  BP 120/76 (Patient Position: Sitting)   Pulse 65   Temp 98.4 F (36.9 C) (Oral)   Resp 16   Wt 232 lb (105.2 kg)   SpO2 98%   BMI 33.29 kg/m

## 2021-10-31 NOTE — Progress Notes (Signed)
Radiation Oncology         [phone]) [phone] ________________________________  Name: Gregory Carey MRN: [id number]  Date: 10/31/2021  DOB: Nov 28, 1960  Follow-Up Visit Note  CC: Sharion Balloon, FNP  Izora Gala, MD  Diagnosis and Prior Radiotherapy:       ICD-10-CM   1. Metastatic squamous cell carcinoma to lymph node (HCC)  C77.9     2. Malignant neoplasm of base of tongue (HCC)  C01 oxymetazoline (AFRIN) 0.05 % nasal spray 1 spray    Fiberoptic laryngoscopy      Cancer Staging  Metastatic squamous cell carcinoma to lymph node (HCC) Staging form: Pharynx - HPV-Mediated Oropharynx, AJCC 8th Edition - Clinical stage from 11/01/2020: Stage I (cT1, cN1, cM0, p16+) - Signed by Eppie Gibson, MD on 11/01/2020 Stage prefix: Initial diagnosis   CHIEF COMPLAINT:  Here for follow-up and surveillance of throat cancer  Narrative:    Gregory Carey presents today for follow-up after completing radiation to his oropharynx on 01/16/2021  Pain issues, if any: no Using a feeding tube?: N/A Weight changes, if any: slowing gaining - has gained 15 lbs since the end of treatment  Swallowing issues, if any: has to be careful and chew a lot when eating. Smoking or chewing tobacco? no Using fluoride trays daily? yes Last ENT visit was on: 05/15/2021 Saw Dr. Izora Gala --Physical Exam:  Healthy-appearing gentleman, voice is clear, breathing is normal. Ears are healthy and clear. Nasal exam is unremarkable. Oral cavity and pharynx reveals healthy dentition. Tongue and floor of mouth look healthy and clear. Pharynx looks clear. Indirect exam of the larynx reveals no mucosal lesions identified. Cords move well. No palpable neck masses. Diffuse fibrosis but mild.  --Impression & Plans:  Stable, no evidence of recurrent disease. Recheck 3 months or sooner as needed.  Other notable issues, if any: Reports having a skin biopsy done last week on the back of his neck and the left side of his neck by Dr. Pearline Cables at  [facility].  He hasn't heard the results yet.  BP 120/76 (Patient Position: Sitting)   Pulse 65   Temp 98.4 F (36.9 C) (Oral)   Resp 16   Wt 232 lb (105.2 kg)   SpO2 98%   BMI 33.29 kg/m      ALLERGIES:  has No Known Allergies.  Meds: Current Outpatient Medications  Medication Sig Dispense Refill   blood glucose meter kit and supplies Dispense based on patient and insurance preference. Use up to four times daily as directed. (FOR ICD-9 250.00, 250.01). 1 each 0   busPIRone (BUSPAR) 5 MG tablet Take 1 tablet (5 mg total) by mouth 3 (three) times daily as needed. 90 tablet 1   CONTOUR NEXT TEST test strip USE AS DIRECTED TWICE DAILY 100 each 0   lisinopril-hydrochlorothiazide (ZESTORETIC) 20-12.5 MG tablet Take 2 tablets by mouth once daily 60 tablet 4   metFORMIN (GLUCOPHAGE-XR) 500 MG 24 hr tablet Take 1 tablet (500 mg total) by mouth daily with breakfast. 90 tablet 1   rosuvastatin (CRESTOR) 20 MG tablet Take 1 tablet (20 mg total) by mouth daily. 90 tablet 3   sodium fluoride (SODIUM FLUORIDE 5000 PPM) 1.1 % GEL dental gel Place 1 pea-size drop into each tooth space of fluoride trays once a day at bedtime.  Leave trays in for 5 minutes and then remove.  Spit out excess fluoride, but DO NOT rinse with water, eat or drink for at least 30 minutes after use.  120 mL 11   aspirin EC 81 MG tablet Take 1 tablet (81 mg total) by mouth daily. (Patient not taking: Reported on 10/31/2021) 90 tablet 1   No current facility-administered medications for this encounter.    Physical Findings: The patient is in no acute distress. Patient is alert and oriented. Wt Readings from Last 3 Encounters:  10/31/21 232 lb (105.2 kg)  10/06/21 231 lb (104.8 kg)  09/05/21 229 lb (103.9 kg)    weight is 232 lb (105.2 kg). His oral temperature is 98.4 F (36.9 C). His blood pressure is 120/76 and his pulse is 65. His respiration is 16 and oxygen saturation is 98%. .  General: Alert and oriented, in  no acute distress HEENT: Head is normocephalic. Extraocular movements are intact. Mouth/Oropharynx  notable for no lesions Neck: Neck is notable for no masses Skin: Skin in treatment fields shows satisfactory healing  Lymphatics: see Neck Exam Psychiatric: Judgment and insight are intact. Affect is appropriate.   PROCEDURE NOTE: After obtaining consent and spraying nasal cavity with topical oxymetazoline, the flexible endoscope was coated with lidocaine gel and introduced and passed through the nasal cavity.  The nasopharynx, oropharynx, hypopharynx, and larynx  were then examined.  Nasopharynx shows some webbing of the mucosa but this does not appear neoplastic; no other notable findings appreciated in the mucosal axis.  The true cords were symmetrically mobile.  He tolerated the procedure well     Lab Findings: Lab Results  Component Value Date   WBC 6.0 09/05/2021   HGB 15.0 09/05/2021   HCT 45.2 09/05/2021   MCV 88 09/05/2021   PLT 259 09/05/2021    Lab Results  Component Value Date   TSH 0.538 11/20/2020    Radiographic Findings: No results found.  Impression/Plan:    1) Head and Neck Cancer Status: NED, in remission. Of note, aortic aneurysm is present on previous CT chest. I had recommended referral to Cardiothoracic surgery for monitoring. He declines this and states he will discuss w/ his PCP instead.   We will arrange restaging CT of neck and chest for monitoring in 6 months and I will see him back at that time  2) Nutritional Status:  Wt Readings from Last 3 Encounters:  10/31/21 232 lb (105.2 kg)  10/06/21 231 lb (104.8 kg)  09/05/21 229 lb (103.9 kg)    He is eating well  3) PEG tube?: none  4) Swallowing: good function, continues slp exercises  5) Dental: Encouraged to continue regular followup with dentistry, and dental hygiene including fluoride rinses.  He has not seen his dentist in a while and I recommended he make an appointment.  He states he is  using his fluoride trays intermittently and will try to do this nightly  6) Thyroid function: check annually -results pending today Lab Results  Component Value Date   TSH 0.538 11/20/2020    7) Other: f/u with Dr. Constance Holster in 3 months  On date of service, in total, I spent 30 minutes on this encounter. Patient was seen in person. _____________________________________   Eppie Gibson, MD

## 2021-11-30 ENCOUNTER — Other Ambulatory Visit: Payer: Self-pay | Admitting: Family

## 2021-11-30 DIAGNOSIS — F411 Generalized anxiety disorder: Secondary | ICD-10-CM

## 2021-12-03 DIAGNOSIS — D485 Neoplasm of uncertain behavior of skin: Secondary | ICD-10-CM | POA: Diagnosis not present

## 2021-12-03 DIAGNOSIS — C4442 Squamous cell carcinoma of skin of scalp and neck: Secondary | ICD-10-CM | POA: Diagnosis not present

## 2021-12-03 DIAGNOSIS — C44329 Squamous cell carcinoma of skin of other parts of face: Secondary | ICD-10-CM | POA: Diagnosis not present

## 2021-12-17 DIAGNOSIS — C4442 Squamous cell carcinoma of skin of scalp and neck: Secondary | ICD-10-CM | POA: Diagnosis not present

## 2021-12-22 ENCOUNTER — Telehealth: Payer: BC Managed Care – PPO | Admitting: Family

## 2021-12-22 DIAGNOSIS — Z79899 Other long term (current) drug therapy: Secondary | ICD-10-CM | POA: Diagnosis not present

## 2021-12-22 DIAGNOSIS — E119 Type 2 diabetes mellitus without complications: Secondary | ICD-10-CM | POA: Diagnosis not present

## 2021-12-22 DIAGNOSIS — I10 Essential (primary) hypertension: Secondary | ICD-10-CM | POA: Diagnosis not present

## 2021-12-22 DIAGNOSIS — Z7984 Long term (current) use of oral hypoglycemic drugs: Secondary | ICD-10-CM | POA: Diagnosis not present

## 2021-12-22 DIAGNOSIS — G510 Bell's palsy: Secondary | ICD-10-CM | POA: Diagnosis not present

## 2021-12-22 DIAGNOSIS — R29810 Facial weakness: Secondary | ICD-10-CM | POA: Diagnosis not present

## 2021-12-22 DIAGNOSIS — Z794 Long term (current) use of insulin: Secondary | ICD-10-CM | POA: Diagnosis not present

## 2021-12-30 ENCOUNTER — Ambulatory Visit (INDEPENDENT_AMBULATORY_CARE_PROVIDER_SITE_OTHER): Payer: BC Managed Care – PPO | Admitting: Family

## 2021-12-30 ENCOUNTER — Encounter: Payer: Self-pay | Admitting: Family

## 2021-12-30 VITALS — BP 134/80 | HR 68 | Temp 98.5°F | Ht 70.0 in | Wt 235.0 lb

## 2021-12-30 DIAGNOSIS — G510 Bell's palsy: Secondary | ICD-10-CM

## 2021-12-30 DIAGNOSIS — E669 Obesity, unspecified: Secondary | ICD-10-CM

## 2021-12-30 DIAGNOSIS — Z09 Encounter for follow-up examination after completed treatment for conditions other than malignant neoplasm: Secondary | ICD-10-CM | POA: Diagnosis not present

## 2021-12-30 DIAGNOSIS — Z794 Long term (current) use of insulin: Secondary | ICD-10-CM

## 2021-12-30 DIAGNOSIS — E1169 Type 2 diabetes mellitus with other specified complication: Secondary | ICD-10-CM | POA: Diagnosis not present

## 2021-12-30 NOTE — Progress Notes (Signed)
Subjective:    Patient ID: Gregory Carey, male    DOB: March 15, 1960, 61 y.o.   MRN: [id number]  Chief Complaint  Patient presents with   ER follow   PT presents to the office today for hospital follow up. He noticed left sided facial droop and was diagnosed with Bells palsy. He was started on prednisone 40 mg daily for 5 days and valtrex 1000 mg TID for 7 days. He competed both of these. He continues to have left facial drooping. He is wearing an eye patch at night.  Diabetes He presents for his follow-up diabetic visit. He has type 2 diabetes mellitus. Pertinent negatives for diabetes include no blurred vision and no foot paresthesias. Symptoms are stable. Risk factors for coronary artery disease include dyslipidemia, diabetes mellitus and male sex. He is following a generally healthy diet. His overall blood glucose range is 130-140 mg/dl.     Review of Systems  Eyes:  Negative for blurred vision.  All other systems reviewed and are negative.      Objective:   Physical Exam Vitals reviewed.  Constitutional:      General: He is not in acute distress.    Appearance: He is well-developed.  HENT:     Head: Normocephalic.     Right Ear: Tympanic membrane normal.     Left Ear: Tympanic membrane normal.  Eyes:     General:        Right eye: No discharge.        Left eye: No discharge.     Pupils: Pupils are equal, round, and reactive to light.  Neck:     Thyroid: No thyromegaly.  Cardiovascular:     Rate and Rhythm: Normal rate and regular rhythm.     Heart sounds: Normal heart sounds. No murmur heard. Pulmonary:     Effort: Pulmonary effort is normal. No respiratory distress.     Breath sounds: Normal breath sounds. No wheezing.  Abdominal:     General: Bowel sounds are normal. There is no distension.     Palpations: Abdomen is soft.     Tenderness: There is no abdominal tenderness.  Musculoskeletal:        General: No tenderness. Normal range of motion.     Cervical  back: Normal range of motion and neck supple.  Skin:    General: Skin is warm and dry.     Findings: No erythema or rash.     Comments: Left facial droop   Neurological:     Mental Status: He is alert and oriented to person, place, and time.     Cranial Nerves: Cranial nerve deficit and facial asymmetry present.     Deep Tendon Reflexes: Reflexes are normal and symmetric.  Psychiatric:        Behavior: Behavior normal.        Thought Content: Thought content normal.        Judgment: Judgment normal.      Diabetic Foot Exam - Simple   Simple Foot Form Diabetic Foot exam was performed with the following findings: Yes 12/30/2021  3:54 PM  Visual Inspection No deformities, no ulcerations, no other skin breakdown bilaterally: Yes Sensation Testing Intact to touch and monofilament testing bilaterally: Yes Pulse Check Posterior Tibialis and Dorsalis pulse intact bilaterally: Yes Comments     BP 134/80   Pulse 68   Temp 98.5 F (36.9 C) (Temporal)   Ht '5\' 10"'$  (1.778 m)   Wt 235 lb (106.6 kg)  BMI 33.72 kg/m      Assessment & Plan:  MARKEE MATERA comes in today with chief complaint of ER follow   Diagnosis and orders addressed:  1. Bell's palsy  2. Hospital discharge follow-up  3. Type 2 diabetes mellitus with other specified complication, with long-term current use of insulin (HCC)  4. Obesity (BMI 30-39.9)  Continue to wear eye patch Continue to use eye drops Hospital notes reviewed Continue low carb diet Keep chronic follow up   Evelina Dun, FNP

## 2021-12-30 NOTE — Patient Instructions (Signed)
Bell's Palsy, Adult  Bell's palsy is a short-term inability to move muscles in a part of the face. The inability to move, also called paralysis, results from inflammation or compression of the seventh cranial nerve. This nerve travels along the skull and under the ear to the side of the face. This nerve is responsible for facial movements that include blinking, closing the eyes, smiling, and frowning. What are the causes? The exact cause of this condition is not known. It may be caused by an infection from a virus, such as the chickenpox (herpes zoster), Epstein-Barr, or mumps virus. What increases the risk? You are more likely to develop this condition if: You are pregnant. You have diabetes. You have had a recent infection in your nose, throat, or airways. You have a weakened body defense system (immune system). You have had a facial injury, such as a fracture. You have a family history of Bell's palsy. What are the signs or symptoms? Symptoms of this condition include: Weakness on one side of the face. Drooping eyelid and corner of the mouth. Excessive tearing in one eye. Difficulty closing the eyelid. Dry eye. Drooling. Dry mouth. Changes in taste. Change in facial appearance. Pain behind one ear. Ringing in one or both ears. Sensitivity to sound in one ear. Facial twitching. Headache. Impaired speech. Dizziness. Difficulty eating or drinking. Most of the time, only one side of the face is affected. In rare cases, Bell's palsy may affect the whole face. How is this diagnosed? This condition is diagnosed based on: Your symptoms. Your medical history. A physical exam. You may also have to see health care providers who specialize in disorders of the nerves (neurologist) or diseases and conditions of the eye (ophthalmologist). You may have tests, such as: A test to check for nerve damage (electromyogram). Imaging studies, such as a CT scan or an MRI. Blood tests. How is this  treated? This condition affects every person differently. Sometimes symptoms go away without treatment within a couple weeks. If treatment is needed, it varies from person to person. The goal of treatment is to reduce inflammation and protect the eye from damage. Treatment for Bell's palsy may include: Medicines, such as: Steroids to reduce swelling and inflammation. Antiviral medicines. Pain relievers, including aspirin, acetaminophen, or ibuprofen. Eye drops or ointment to keep your eye moist. Eye protection, if you cannot close your eye. Exercises or massage to regain muscle strength and function (physical therapy). Follow these instructions at home:  Take over-the-counter and prescription medicines only as told by your health care provider. If your eye is affected: Keep your eye moist with eye drops or ointment as told by your health care provider. Follow instructions for eye care and protection as told by your health care provider. Do any physical therapy exercises as told by your health care provider. Keep all follow-up visits. This is important. Contact a health care provider if: You have a fever or chills. Your symptoms do not get better within 2-3 weeks, or your symptoms get worse. Your eye is red, irritated, or painful. You have new symptoms. Get help right away if: You have weakness or numbness in a part of your body other than your face. You have trouble swallowing. You develop neck pain or stiffness. You develop dizziness or shortness of breath. Summary Bell's palsy is a short-term inability to move muscles in a part of the face. The inability to move results from inflammation or compression of the facial nerve. This condition affects every person   differently. Sometimes symptoms go away without treatment within a couple weeks. If treatment is needed, it varies from person to person. The goal of treatment is to reduce inflammation and protect the eye from damage. Contact  your health care provider if your symptoms do not get better within 2-3 weeks, or your symptoms get worse. This information is not intended to replace advice given to you by your health care provider. Make sure you discuss any questions you have with your health care provider. Document Revised: 11/02/2019 Document Reviewed: 11/02/2019 Elsevier Patient Education  2023 [employer]vier Inc.  

## 2021-12-31 DIAGNOSIS — D0439 Carcinoma in situ of skin of other parts of face: Secondary | ICD-10-CM | POA: Diagnosis not present

## 2022-01-05 NOTE — Therapy (Signed)
[facility] [address] [place], [place], [place], [address] Phone: [phone]   Fax:  [phone]  Patient Details  Name: Gregory Carey MRN: [id number] Date of Birth: 09/18/1960 Referring Provider:  Eppie Gibson, MD  Encounter Date: 01/05/2022 SPEECH THERAPY DISCHARGE SUMMARY  Visits from Start of Care: 5  Current functional level related to goals / functional outcomes: SLP rec'd note from RN navigator stating: "patient has declined further SLP tx." Goals and plan from last appointment were:  SLP Short Term Goals - 02-25-21        SLP SHORT TERM GOAL #1      Title pt will complete HEP with rare min A    Time      Period --   vists, for all STGs    Status Achieved           SLP SHORT TERM GOAL #2    Title pt will tell SLP why pt is completing HEP with modified independence    Time      Status Achieved           SLP SHORT TERM GOAL #3    Title pt will describe 3 overt s/s aspiration PNA with modified independence    Time      Status Achieved           SLP SHORT TERM GOAL #4    Title pt will tell SLP how a food journal could hasten return to a more normalized diet    Time      Status  Achieved                    SLP Long Term Goals - 02-25-21                      SLP LONG TERM GOAL #1    Title pt will complete HEP with modified independence over two visits    Time 1    Period --   visits, for all LTGs    Status Ongoing           SLP LONG TERM GOAL #2    Title pt will describe how to modify HEP over time, and the timeline associated with reduction in HEP frequency with modified independence over two sessions    Time 2    Status Ongoing                    Plan - 03-25-21      Clinical Impression Statement At this time pt swallowing is deemed WNL/WFL with regular diet (as tolerated) and thin liquids using effortful swallow. SLP reviewed pt's individualized HEP for dysphagia and pt completed each exercise on  their own independently. There are no overt s/s aspiration reported by pt at this time. Data indicate that pt's swallow ability could decline over time following conclusion of their radiation therapy due to muscle disuse atrophy and/or muscle fibrosis. Currently pt cont not performing HEP to recommended frequency/ with recommended scope and concern exists for muscle fibrosis. SLP voiced these concerns to pt today in session.  *Pt will cont to need to be seen by SLP in order to assess safety of PO intake, assess the need for recommending any objective swallow assessment, and ensuring pt correctly completes the individualized HEP.* Pt will decr ST frequency due to independent with HEP procedure and WNL/WFL swallow ability.     Remaining deficits: Unknown. Pt  did not attend further ST.   Education / Equipment: HEP procedure, late effects head/neck radiation on swallowing, others in daily therapy notes.  Patient agrees to discharge. Patient goals were partially met. Patient is being discharged due to not returning since the last visit.Marland Kitchen     [place], [place] 01/05/2022, 9:09 AM  [facility] [address], [place], [place], [address] Phone: [phone]   Fax:  [phone]

## 2022-01-29 DIAGNOSIS — C801 Malignant (primary) neoplasm, unspecified: Secondary | ICD-10-CM | POA: Diagnosis not present

## 2022-01-29 DIAGNOSIS — C7989 Secondary malignant neoplasm of other specified sites: Secondary | ICD-10-CM | POA: Diagnosis not present

## 2022-01-29 DIAGNOSIS — C109 Malignant neoplasm of oropharynx, unspecified: Secondary | ICD-10-CM | POA: Diagnosis not present

## 2022-01-29 DIAGNOSIS — Z923 Personal history of irradiation: Secondary | ICD-10-CM | POA: Diagnosis not present

## 2022-02-18 DIAGNOSIS — D485 Neoplasm of uncertain behavior of skin: Secondary | ICD-10-CM | POA: Diagnosis not present

## 2022-02-18 DIAGNOSIS — L905 Scar conditions and fibrosis of skin: Secondary | ICD-10-CM | POA: Diagnosis not present

## 2022-02-18 DIAGNOSIS — C44529 Squamous cell carcinoma of skin of other part of trunk: Secondary | ICD-10-CM | POA: Diagnosis not present

## 2022-02-18 DIAGNOSIS — L578 Other skin changes due to chronic exposure to nonionizing radiation: Secondary | ICD-10-CM | POA: Diagnosis not present

## 2022-02-18 DIAGNOSIS — L570 Actinic keratosis: Secondary | ICD-10-CM | POA: Diagnosis not present

## 2022-02-26 DIAGNOSIS — C44529 Squamous cell carcinoma of skin of other part of trunk: Secondary | ICD-10-CM | POA: Diagnosis not present

## 2022-02-26 IMAGING — PT NM PET TUM IMG INITIAL (PI) SKULL BASE T - THIGH
1 series · 3 of 3 positions shown · non-contrast
Comparison: None.
COMPARISON: None.

Addendum:
CLINICAL DATA: Initial treatment strategy for head neck cancer.

EXAM:
NUCLEAR MEDICINE PET SKULL BASE TO THIGH
TECHNIQUE: 14.8 mCi F-18 FDG was injected intravenously. Full-ring PET imaging
was performed from the skull base to thigh after the radiotracer. CT
data was obtained and used for attenuation correction and anatomic
localization.
Fasting blood glucose: 109 mg/dl

[Series 1073: results mm oncology reading · 1.3mm · 1.22mm/px · 3 of 3 slices shown]
[im 1/3]
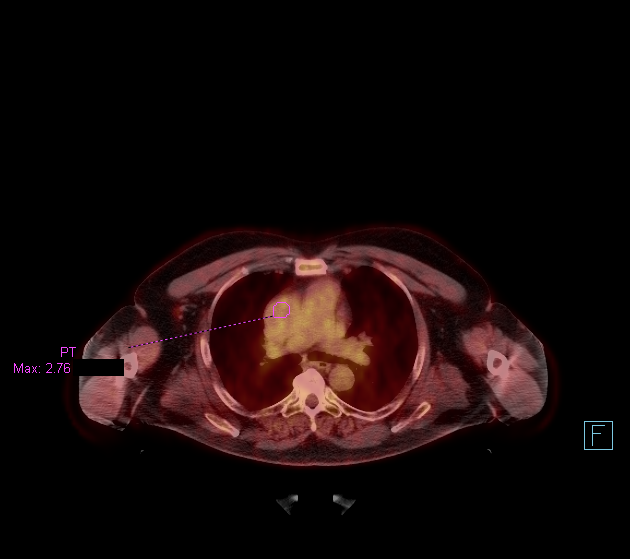
[im 2/3]
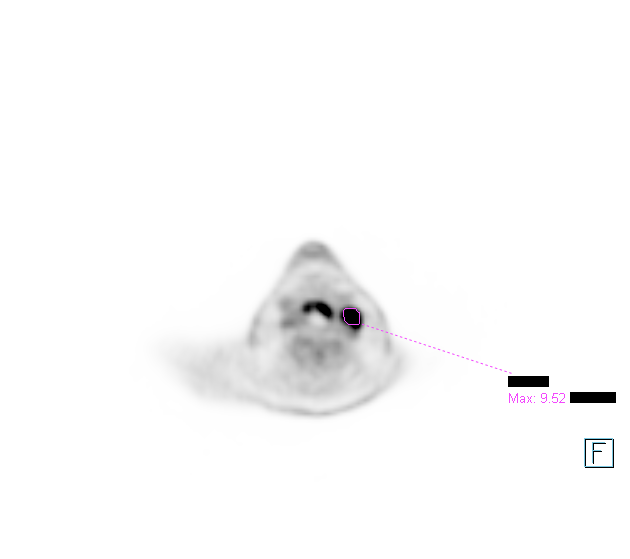
[im 3/3]
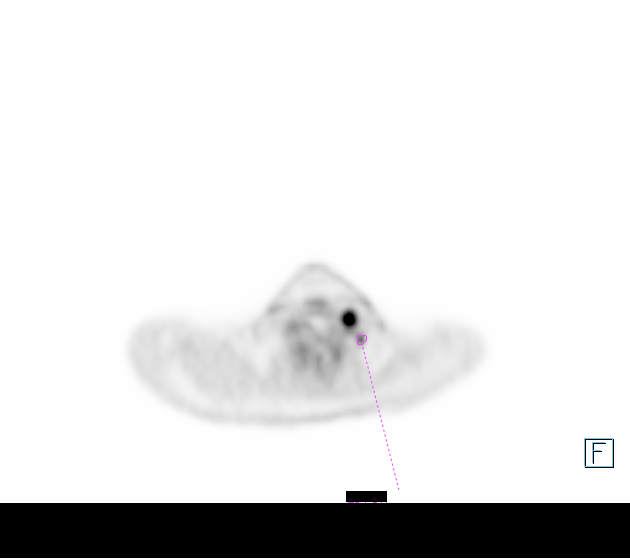

[3 of 3 positions shown; findings below may reference images not displayed]

FINDINGS: Mediastinal blood pool activity: SUV max

Liver activity: SUV max NA

NECK: Symmetric uptake identified along the anterior hypopharynx. A
left-sided level II lymph node measures 2.8 x 1.9 cm on image 39/4
with SUV max = 9.5.

7 mm short axis low left-sided level II node on 43/4 is
hypermetabolic with SUV max = 3.1.

No hypermetabolic lymphadenopathy in the right neck.

Incidental CT findings: none

CHEST: No hypermetabolic mediastinal or hilar nodes. No suspicious
pulmonary nodules on the CT scan.

Incidental CT findings: Ascending thoracic aorta measures up to
cm diameter. Calcified lymph nodes in the anterior mediastinum and
inferior right hilum suggest prior granulomatous disease. Calcified
granuloma noted right lower lobe.

ABDOMEN/PELVIS: No abnormal hypermetabolic activity within the
liver, pancreas, adrenal glands, or spleen. No hypermetabolic lymph
nodes in the abdomen or pelvis.

Incidental CT findings: Diffuse low attenuation of liver parenchyma
is compatible with fatty deposition. Cysts in the left kidney
measure up to 6.7 cm moderate atherosclerotic calcification noted in
the abdominal aorta without aneurysm. Diverticular changes noted
left colon without diverticulitis.

SKELETON: Mottled radiotracer uptake is identified in the bony
anatomy without a definite hypermetabolic bone metastasis
identified.

Incidental CT findings: Status post left shoulder replacement.
Degenerative changes noted right shoulder
IMPRESSION: 1. Relatively symmetric hypermetabolic FDG uptake identified in the
anterior hypopharynx. No discrete oro pharyngeal hypermetabolic
lesion evident on PET imaging. CT neck with contrast could be used
to further evaluate as clinically warranted.
2. Hypermetabolic left-sided level II cervical lymphadenopathy.
3. No evidence for hypermetabolic metastatic disease in the chest,
abdomen, or pelvis.
4. Hepatic steatosis.
5.  Aortic Atherosclerois (9WYJ5-170.0)

ADDENDUM:
As noted in the body of the report, the ascending thoracic aorta
measures up to 4.4 cm diameter. Recommend annual imaging followup by
CTA or MRA. This recommendation follows 0757
ACCF/AHA/AATS/ACR/ASA/SCA/TETARD/CONSTANDIA/MONISEK/RTOYOTA Guidelines for the
Diagnosis and Management of Patients with Thoracic Aortic Disease.
Circulation. 0757; 121: [id number]. Aortic aneurysm NOS
(9WYJ5-W35.P).

*** End of Addendum ***
FINDINGS: Mediastinal blood pool activity: SUV max

Liver activity: SUV max NA

NECK: Symmetric uptake identified along the anterior hypopharynx. A
left-sided level II lymph node measures 2.8 x 1.9 cm on image 39/4
with SUV max = 9.5.

7 mm short axis low left-sided level II node on 43/4 is
hypermetabolic with SUV max = 3.1.

No hypermetabolic lymphadenopathy in the right neck.

Incidental CT findings: none

CHEST: No hypermetabolic mediastinal or hilar nodes. No suspicious
pulmonary nodules on the CT scan.

Incidental CT findings: Ascending thoracic aorta measures up to
cm diameter. Calcified lymph nodes in the anterior mediastinum and
inferior right hilum suggest prior granulomatous disease. Calcified
granuloma noted right lower lobe.

ABDOMEN/PELVIS: No abnormal hypermetabolic activity within the
liver, pancreas, adrenal glands, or spleen. No hypermetabolic lymph
nodes in the abdomen or pelvis.

Incidental CT findings: Diffuse low attenuation of liver parenchyma
is compatible with fatty deposition. Cysts in the left kidney
measure up to 6.7 cm moderate atherosclerotic calcification noted in
the abdominal aorta without aneurysm. Diverticular changes noted
left colon without diverticulitis.

SKELETON: Mottled radiotracer uptake is identified in the bony
anatomy without a definite hypermetabolic bone metastasis
identified.

Incidental CT findings: Status post left shoulder replacement.
Degenerative changes noted right shoulder
IMPRESSION: 1. Relatively symmetric hypermetabolic FDG uptake identified in the
anterior hypopharynx. No discrete oro pharyngeal hypermetabolic
lesion evident on PET imaging. CT neck with contrast could be used
to further evaluate as clinically warranted.
2. Hypermetabolic left-sided level II cervical lymphadenopathy.
3. No evidence for hypermetabolic metastatic disease in the chest,
abdomen, or pelvis.
4. Hepatic steatosis.
5.  Aortic Atherosclerois (9WYJ5-170.0)

## 2022-03-29 ENCOUNTER — Other Ambulatory Visit: Payer: Self-pay | Admitting: Family

## 2022-03-29 DIAGNOSIS — I10 Essential (primary) hypertension: Secondary | ICD-10-CM

## 2022-03-31 ENCOUNTER — Encounter: Payer: Self-pay | Admitting: Family

## 2022-03-31 ENCOUNTER — Ambulatory Visit (INDEPENDENT_AMBULATORY_CARE_PROVIDER_SITE_OTHER): Payer: BC Managed Care – PPO | Admitting: Family

## 2022-03-31 ENCOUNTER — Other Ambulatory Visit: Payer: Self-pay | Admitting: Family

## 2022-03-31 VITALS — BP 134/80 | HR 71 | Temp 98.6°F | Ht 70.0 in | Wt 237.6 lb

## 2022-03-31 DIAGNOSIS — Z923 Personal history of irradiation: Secondary | ICD-10-CM

## 2022-03-31 DIAGNOSIS — I10 Essential (primary) hypertension: Secondary | ICD-10-CM

## 2022-03-31 DIAGNOSIS — Z0001 Encounter for general adult medical examination with abnormal findings: Secondary | ICD-10-CM | POA: Diagnosis not present

## 2022-03-31 DIAGNOSIS — E1169 Type 2 diabetes mellitus with other specified complication: Secondary | ICD-10-CM

## 2022-03-31 DIAGNOSIS — Z794 Long term (current) use of insulin: Secondary | ICD-10-CM | POA: Diagnosis not present

## 2022-03-31 DIAGNOSIS — Z96612 Presence of left artificial shoulder joint: Secondary | ICD-10-CM

## 2022-03-31 DIAGNOSIS — E669 Obesity, unspecified: Secondary | ICD-10-CM | POA: Diagnosis not present

## 2022-03-31 DIAGNOSIS — Z0000 Encounter for general adult medical examination without abnormal findings: Secondary | ICD-10-CM | POA: Diagnosis not present

## 2022-03-31 DIAGNOSIS — Z23 Encounter for immunization: Secondary | ICD-10-CM

## 2022-03-31 DIAGNOSIS — C01 Malignant neoplasm of base of tongue: Secondary | ICD-10-CM

## 2022-03-31 DIAGNOSIS — E1165 Type 2 diabetes mellitus with hyperglycemia: Secondary | ICD-10-CM

## 2022-03-31 DIAGNOSIS — F411 Generalized anxiety disorder: Secondary | ICD-10-CM | POA: Diagnosis not present

## 2022-03-31 DIAGNOSIS — E785 Hyperlipidemia, unspecified: Secondary | ICD-10-CM

## 2022-03-31 DIAGNOSIS — G4733 Obstructive sleep apnea (adult) (pediatric): Secondary | ICD-10-CM

## 2022-03-31 LAB — BAYER DCA HB A1C WAIVED: HB A1C (BAYER DCA - WAIVED): 6.4 % — ABNORMAL HIGH (ref 4.8–5.6)

## 2022-03-31 NOTE — Progress Notes (Signed)
Subjective:    Patient ID: Gregory Carey, male    DOB: Jun 16, 1960, 62 y.o.   MRN: [id number]  Chief Complaint  Patient presents with   Annual Exam   Pt presents today for CPE and chronic follow up.   He has metastatic squamous neck cancer. He had surgery on 10/23/20 for laryngoscopy and biopsy. He completed radiation. He is followed by Oncologists.    He has OSA and uses CPAP nightly.  Hypertension This is a chronic problem. The current episode started more than 1 year ago. The problem has been resolved since onset. The problem is controlled. Associated symptoms include anxiety. Pertinent negatives include no blurred vision, malaise/fatigue, peripheral edema or shortness of breath. Risk factors for coronary artery disease include diabetes mellitus, dyslipidemia and male gender. The current treatment provides moderate improvement. There is no history of CVA or heart failure.  Diabetes He presents for his follow-up diabetic visit. He has type 2 diabetes mellitus. Hypoglycemia symptoms include nervousness/anxiousness. Pertinent negatives for diabetes include no blurred vision and no foot paresthesias. Symptoms are stable. Pertinent negatives for diabetic complications include no CVA. Risk factors for coronary artery disease include dyslipidemia, diabetes mellitus, hypertension, sedentary lifestyle and male sex. He is following a generally healthy diet. His overall blood glucose range is 90-110 mg/dl. An ACE inhibitor/angiotensin II receptor blocker is being taken. Eye exam is not current.  Arthritis Presents for follow-up visit. He complains of pain and stiffness. Affected locations include the left knee and right knee. His pain is at a severity of 6/10.  Hyperlipidemia This is a chronic problem. The current episode started more than 1 year ago. The problem is controlled. Recent lipid tests were reviewed and are normal. Exacerbating diseases include obesity. Pertinent negatives include no  shortness of breath. Current antihyperlipidemic treatment includes statins. The current treatment provides moderate improvement of lipids. Risk factors for coronary artery disease include dyslipidemia, diabetes mellitus, hypertension, male sex and a sedentary lifestyle.  Anxiety Presents for follow-up visit. Symptoms include excessive worry and nervous/anxious behavior. Patient reports no shortness of breath. Symptoms occur rarely. The severity of symptoms is mild.        Review of Systems  Constitutional:  Negative for malaise/fatigue.  Eyes:  Negative for blurred vision.  Respiratory:  Negative for shortness of breath.   Musculoskeletal:  Positive for arthritis and stiffness.  Psychiatric/Behavioral:  The patient is nervous/anxious.   All other systems reviewed and are negative.  Family History  Problem Relation Age of Onset   CAD Father    Diabetes Father    Diabetes Paternal Grandmother    Colon cancer Neg Hx    Colon polyps Neg Hx    Social History   Socioeconomic History   Marital status: Married    Spouse name: Not on file   Number of children: Not on file   Years of education: Not on file   Highest education level: Not on file  Occupational History   Not on file  Tobacco Use   Smoking status: Never   Smokeless tobacco: Never  Vaping Use   Vaping Use: Never used  Substance and Sexual Activity   Alcohol use: No   Drug use: No   Sexual activity: Yes  Other Topics Concern   Not on file  Social History Narrative   Not on file   Social Determinants of Health   Financial Resource Strain: Low Risk  (11/13/2020)   Overall Financial Resource Strain (CARDIA)    Difficulty of  Paying Living Expenses: Not very hard  Food Insecurity: No Food Insecurity (08/22/2020)   Hunger Vital Sign    Worried About Running Out of Food in the Last Year: Never true    Ran Out of Food in the Last Year: Never true  Transportation Needs: No Transportation Needs (08/22/2020)   PRAPARE -  Hydrologist (Medical): No    Lack of Transportation (Non-Medical): No  Physical Activity: Not on file  Stress: No Stress Concern Present (08/22/2020)   [place]    Feeling of Stress : Only a little  Social Connections: Not on file       Objective:   Physical Exam Vitals reviewed.  Constitutional:      General: He is not in acute distress.    Appearance: He is well-developed. He is obese.  HENT:     Head: Normocephalic.     Right Ear: Tympanic membrane normal.     Left Ear: Tympanic membrane normal.  Eyes:     General:        Right eye: No discharge.        Left eye: No discharge.     Pupils: Pupils are equal, round, and reactive to light.  Neck:     Thyroid: No thyromegaly.  Cardiovascular:     Rate and Rhythm: Normal rate and regular rhythm.     Heart sounds: Normal heart sounds. No murmur heard. Pulmonary:     Effort: Pulmonary effort is normal. No respiratory distress.     Breath sounds: Normal breath sounds. No wheezing.  Abdominal:     General: Bowel sounds are normal. There is no distension.     Palpations: Abdomen is soft.     Tenderness: There is no abdominal tenderness.  Musculoskeletal:        General: No tenderness. Normal range of motion.     Cervical back: Normal range of motion and neck supple.  Skin:    General: Skin is warm and dry.     Findings: No erythema or rash.  Neurological:     Mental Status: He is alert and oriented to person, place, and time.     Cranial Nerves: No cranial nerve deficit.     Deep Tendon Reflexes: Reflexes are normal and symmetric.  Psychiatric:        Behavior: Behavior normal.        Thought Content: Thought content normal.        Judgment: Judgment normal.       BP 134/80   Pulse 71   Temp 98.6 F (37 C) (Temporal)   Ht 5' 10"$  (1.778 m)   Wt 237 lb 9.6 oz (107.8 kg)   SpO2 97%   BMI 34.09 kg/m      Assessment &  Plan:  Gregory Carey comes in today with chief complaint of Annual Exam   Diagnosis and orders addressed:  1. Type 2 diabetes mellitus with other specified complication, with long-term current use of insulin (HCC) - CMP14+EGFR - CBC with Differential/Platelet - Bayer DCA Hb A1c Waived  2. Essential hypertension - CMP14+EGFR - CBC with Differential/Platelet  3. GAD (generalized anxiety disorder) - CMP14+EGFR - CBC with Differential/Platelet  4. History of left shoulder replacement - CMP14+EGFR - CBC with Differential/Platelet  5. History of radiation to head and neck region - CMP14+EGFR - CBC with Differential/Platelet  6. Hyperlipidemia, unspecified hyperlipidemia type - CMP14+EGFR - CBC with Differential/Platelet -  Lipid panel  7. Malignant neoplasm of base of tongue (HCC) - CMP14+EGFR - CBC with Differential/Platelet  8. Obesity (BMI 30-39.9) - CMP14+EGFR - CBC with Differential/Platelet  9. OSA (obstructive sleep apnea) - CMP14+EGFR - CBC with Differential/Platelet  10. Annual physical exam - CMP14+EGFR - CBC with Differential/Platelet - Bayer DCA Hb A1c Waived - Lipid panel - TSH - PSA, total and free   Labs pending Health Maintenance reviewed Diet and exercise encouraged  Follow up plan: 6 months    Evelina Dun, FNP

## 2022-03-31 NOTE — Patient Instructions (Signed)
Health Maintenance, Male Adopting a healthy lifestyle and getting preventive care are important in promoting health and wellness. Ask your health care provider about: The right schedule for you to have regular tests and exams. Things you can do on your own to prevent diseases and keep yourself healthy. What should I know about diet, weight, and exercise? Eat a healthy diet  Eat a diet that includes plenty of vegetables, fruits, low-fat dairy products, and lean protein. Do not eat a lot of foods that are high in solid fats, added sugars, or sodium. Maintain a healthy weight Body mass index (BMI) is a measurement that can be used to identify possible weight problems. It estimates body fat based on height and weight. Your health care provider can help determine your BMI and help you achieve or maintain a healthy weight. Get regular exercise Get regular exercise. This is one of the most important things you can do for your health. Most adults should: Exercise for at least 150 minutes each week. The exercise should increase your heart rate and make you sweat (moderate-intensity exercise). Do strengthening exercises at least twice a week. This is in addition to the moderate-intensity exercise. Spend less time sitting. Even light physical activity can be beneficial. Watch cholesterol and blood lipids Have your blood tested for lipids and cholesterol at 61 years of age, then have this test every 5 years. You may need to have your cholesterol levels checked more often if: Your lipid or cholesterol levels are high. You are older than 61 years of age. You are at high risk for heart disease. What should I know about cancer screening? Many types of cancers can be detected early and may often be prevented. Depending on your health history and family history, you may need to have cancer screening at various ages. This may include screening for: Colorectal cancer. Prostate cancer. Skin cancer. Lung  cancer. What should I know about heart disease, diabetes, and high blood pressure? Blood pressure and heart disease High blood pressure causes heart disease and increases the risk of stroke. This is more likely to develop in people who have high blood pressure readings or are overweight. Talk with your health care provider about your target blood pressure readings. Have your blood pressure checked: Every 3-5 years if you are 18-39 years of age. Every year if you are 40 years old or older. If you are between the ages of 65 and 75 and are a current or former smoker, ask your health care provider if you should have a one-time screening for abdominal aortic aneurysm (AAA). Diabetes Have regular diabetes screenings. This checks your fasting blood sugar level. Have the screening done: Once every three years after age 45 if you are at a normal weight and have a low risk for diabetes. More often and at a younger age if you are overweight or have a high risk for diabetes. What should I know about preventing infection? Hepatitis B If you have a higher risk for hepatitis B, you should be screened for this virus. Talk with your health care provider to find out if you are at risk for hepatitis B infection. Hepatitis C Blood testing is recommended for: Everyone born from 1945 through 1965. Anyone with known risk factors for hepatitis C. Sexually transmitted infections (STIs) You should be screened each year for STIs, including gonorrhea and chlamydia, if: You are sexually active and are younger than 61 years of age. You are older than 61 years of age and your   health care provider tells you that you are at risk for this type of infection. Your sexual activity has changed since you were last screened, and you are at increased risk for chlamydia or gonorrhea. Ask your health care provider if you are at risk. Ask your health care provider about whether you are at high risk for HIV. Your health care provider  may recommend a prescription medicine to help prevent HIV infection. If you choose to take medicine to prevent HIV, you should first get tested for HIV. You should then be tested every 3 months for as long as you are taking the medicine. Follow these instructions at home: Alcohol use Do not drink alcohol if your health care provider tells you not to drink. If you drink alcohol: Limit how much you have to 0-2 drinks a day. Know how much alcohol is in your drink. In the [place] one drink equals one 12 oz bottle of beer (355 mL), one 5 oz glass of wine (148 mL), or one 1 oz glass of hard liquor (44 mL). Lifestyle Do not use any products that contain nicotine or tobacco. These products include cigarettes, chewing tobacco, and vaping devices, such as e-cigarettes. If you need help quitting, ask your health care provider. Do not use street drugs. Do not share needles. Ask your health care provider for help if you need support or information about quitting drugs. General instructions Schedule regular health, dental, and eye exams. Stay current with your vaccines. Tell your health care provider if: You often feel depressed. You have ever been abused or do not feel safe at home. Summary Adopting a healthy lifestyle and getting preventive care are important in promoting health and wellness. Follow your health care provider's instructions about healthy diet, exercising, and getting tested or screened for diseases. Follow your health care provider's instructions on monitoring your cholesterol and blood pressure. This information is not intended to replace advice given to you by your health care provider. Make sure you discuss any questions you have with your health care provider. Document Revised: 06/24/2020 Document Reviewed: 06/24/2020 Elsevier Patient Education  2023 [employer] Inc[employer]  

## 2022-04-01 LAB — CMP14+EGFR
ALT: 25 IU/L (ref 0–44)
AST: 23 IU/L (ref 0–40)
Albumin/Globulin Ratio: 2.4 — ABNORMAL HIGH (ref 1.2–2.2)
Albumin: 4.7 g/dL (ref 3.8–4.9)
Alkaline Phosphatase: 67 IU/L (ref 44–121)
BUN/Creatinine Ratio: 13 (ref 10–24)
BUN: 12 mg/dL (ref 8–27)
Bilirubin Total: 0.6 mg/dL (ref 0.0–1.2)
CO2: 24 mmol/L (ref 20–29)
Calcium: 9.8 mg/dL (ref 8.6–10.2)
Chloride: 102 mmol/L (ref 96–106)
Creatinine, Ser: 0.91 mg/dL (ref 0.76–1.27)
Globulin, Total: 2 g/dL (ref 1.5–4.5)
Glucose: 110 mg/dL — ABNORMAL HIGH (ref 70–99)
Potassium: 4.1 mmol/L (ref 3.5–5.2)
Sodium: 142 mmol/L (ref 134–144)
Total Protein: 6.7 g/dL (ref 6.0–8.5)
eGFR: 96 mL/min/{1.73_m2} (ref >59)

## 2022-04-01 LAB — CBC WITH DIFFERENTIAL/PLATELET
Basophils Absolute: 0 10*3/uL (ref 0.0–0.2)
Basos: 1 %
EOS (ABSOLUTE): 0.1 10*3/uL (ref 0.0–0.4)
Eos: 2 %
Hematocrit: 46.5 % (ref 37.5–51.0)
Hemoglobin: 15.8 g/dL (ref 13.0–17.7)
Immature Grans (Abs): 0 10*3/uL (ref 0.0–0.1)
Immature Granulocytes: 0 %
Lymphocytes Absolute: 1 10*3/uL (ref 0.7–3.1)
Lymphs: 19 %
MCH: 29.4 pg (ref 26.6–33.0)
MCHC: 34 g/dL (ref 31.5–35.7)
MCV: 87 fL (ref 79–97)
Monocytes Absolute: 0.3 10*3/uL (ref 0.1–0.9)
Monocytes: 6 %
Neutrophils Absolute: 3.9 10*3/uL (ref 1.4–7.0)
Neutrophils: 72 %
Platelets: 267 10*3/uL (ref 150–450)
RBC: 5.37 x10E6/uL (ref 4.14–5.80)
RDW: 13.2 % (ref 11.6–15.4)
WBC: 5.5 10*3/uL (ref 3.4–10.8)

## 2022-04-01 LAB — PSA, TOTAL AND FREE
PSA, Free Pct: 28.3 %
PSA, Free: 0.17 ng/mL
Prostate Specific Ag, Serum: 0.6 ng/mL (ref 0.0–4.0)

## 2022-04-01 LAB — LIPID PANEL
Chol/HDL Ratio: 5.2 ratio — ABNORMAL HIGH (ref 0.0–5.0)
Cholesterol, Total: 173 mg/dL (ref 100–199)
HDL: 33 mg/dL — ABNORMAL LOW (ref >39)
LDL Chol Calc (NIH): 114 mg/dL — ABNORMAL HIGH (ref 0–99)
Triglycerides: 143 mg/dL (ref 0–149)
VLDL Cholesterol Cal: 26 mg/dL (ref 5–40)

## 2022-04-01 LAB — TSH: TSH: 1.06 u[IU]/mL (ref 0.450–4.500)

## 2022-04-21 NOTE — Progress Notes (Signed)
Gregory Carey presents today for follow-up after completing radiation to his oropharynx on 01/16/2021   Pain issues, if any: Reports some pain to his left neck from recent skin surgery. Using a feeding tube?: N/a Weight changes, if any: He denies weight loss.  Reports he has put on a few pounds. Swallowing issues, if any: He reports normal swallowing, some occasional irritation in his throat. Smoking or chewing tobacco? None Using fluoride trays daily? Yes Last ENT visit was on: Dr. Constance Holster on 04-29-21, no scope performed Impression & Plans:  2 years posttreatment, no evidence of recurrent disease. Start follow-up every 4 months. Will review CT scan when available.    Other notable issues, if any:

## 2022-04-22 DIAGNOSIS — L570 Actinic keratosis: Secondary | ICD-10-CM | POA: Diagnosis not present

## 2022-04-22 DIAGNOSIS — D492 Neoplasm of unspecified behavior of bone, soft tissue, and skin: Secondary | ICD-10-CM | POA: Diagnosis not present

## 2022-04-22 DIAGNOSIS — L821 Other seborrheic keratosis: Secondary | ICD-10-CM | POA: Diagnosis not present

## 2022-04-22 DIAGNOSIS — D225 Melanocytic nevi of trunk: Secondary | ICD-10-CM | POA: Diagnosis not present

## 2022-04-22 DIAGNOSIS — L814 Other melanin hyperpigmentation: Secondary | ICD-10-CM | POA: Diagnosis not present

## 2022-04-22 DIAGNOSIS — C4442 Squamous cell carcinoma of skin of scalp and neck: Secondary | ICD-10-CM | POA: Diagnosis not present

## 2022-04-30 DIAGNOSIS — C109 Malignant neoplasm of oropharynx, unspecified: Secondary | ICD-10-CM | POA: Diagnosis not present

## 2022-04-30 DIAGNOSIS — Z923 Personal history of irradiation: Secondary | ICD-10-CM | POA: Diagnosis not present

## 2022-05-01 ENCOUNTER — Ambulatory Visit (HOSPITAL_COMMUNITY)
Admission: RE | Admit: 2022-05-01 | Discharge: 2022-05-01 | Disposition: A | Discharge status: Home / Self Care | Payer: BC Managed Care – PPO | Source: Ambulatory Visit | Attending: Radiation Oncology | Admitting: Radiation Oncology

## 2022-05-01 ENCOUNTER — Encounter (HOSPITAL_COMMUNITY): Payer: Self-pay

## 2022-05-01 DIAGNOSIS — C770 Secondary and unspecified malignant neoplasm of lymph nodes of head, face and neck: Secondary | ICD-10-CM | POA: Diagnosis not present

## 2022-05-01 DIAGNOSIS — C779 Secondary and unspecified malignant neoplasm of lymph node, unspecified: Secondary | ICD-10-CM | POA: Diagnosis not present

## 2022-05-01 DIAGNOSIS — C01 Malignant neoplasm of base of tongue: Secondary | ICD-10-CM | POA: Diagnosis not present

## 2022-05-01 DIAGNOSIS — C760 Malignant neoplasm of head, face and neck: Secondary | ICD-10-CM | POA: Diagnosis not present

## 2022-05-01 DIAGNOSIS — C801 Malignant (primary) neoplasm, unspecified: Secondary | ICD-10-CM | POA: Diagnosis not present

## 2022-05-01 DIAGNOSIS — J479 Bronchiectasis, uncomplicated: Secondary | ICD-10-CM | POA: Diagnosis not present

## 2022-05-01 MED ORDER — SODIUM CHLORIDE (PF) 0.9 % IJ SOLN
INTRAMUSCULAR | Status: AC
  Filled 2022-05-01: qty 50

## 2022-05-01 MED ORDER — IOHEXOL 300 MG/ML  SOLN
75.0000 mL | Freq: Once | INTRAMUSCULAR | Status: AC | PRN
  Administered 2022-05-01: 75 mL via INTRAVENOUS

## 2022-05-04 ENCOUNTER — Telehealth: Payer: Self-pay | Admitting: *Deleted

## 2022-05-04 NOTE — Telephone Encounter (Signed)
CALLED PATIENT TO REMIND OF LAB AND FU APPT. FOR 05-05-22, SPOKE WITH PATIENT'S WIFE- LAWANDA AND SHE IS AWARE OF THESE APPTS.

## 2022-05-05 ENCOUNTER — Ambulatory Visit
Admission: RE | Admit: 2022-05-05 | Discharge: 2022-05-05 | Disposition: A | Discharge status: Home / Self Care | Payer: BC Managed Care – PPO | Source: Ambulatory Visit | Attending: Radiation Oncology | Admitting: Radiation Oncology

## 2022-05-05 ENCOUNTER — Encounter: Payer: Self-pay | Admitting: Radiation Oncology

## 2022-05-05 ENCOUNTER — Other Ambulatory Visit: Payer: Self-pay

## 2022-05-05 VITALS — BP 133/80 | HR 56 | Temp 97.2°F | Resp 20 | Ht 71.0 in | Wt 243.0 lb

## 2022-05-05 DIAGNOSIS — M2578 Osteophyte, vertebrae: Secondary | ICD-10-CM | POA: Insufficient documentation

## 2022-05-05 DIAGNOSIS — Q261 Persistent left superior vena cava: Secondary | ICD-10-CM | POA: Insufficient documentation

## 2022-05-05 DIAGNOSIS — C779 Secondary and unspecified malignant neoplasm of lymph node, unspecified: Secondary | ICD-10-CM

## 2022-05-05 DIAGNOSIS — Z7984 Long term (current) use of oral hypoglycemic drugs: Secondary | ICD-10-CM | POA: Insufficient documentation

## 2022-05-05 DIAGNOSIS — Z79899 Other long term (current) drug therapy: Secondary | ICD-10-CM | POA: Insufficient documentation

## 2022-05-05 DIAGNOSIS — C01 Malignant neoplasm of base of tongue: Secondary | ICD-10-CM | POA: Insufficient documentation

## 2022-05-05 DIAGNOSIS — M47819 Spondylosis without myelopathy or radiculopathy, site unspecified: Secondary | ICD-10-CM | POA: Insufficient documentation

## 2022-05-05 DIAGNOSIS — Z1329 Encounter for screening for other suspected endocrine disorder: Secondary | ICD-10-CM | POA: Insufficient documentation

## 2022-05-05 DIAGNOSIS — C770 Secondary and unspecified malignant neoplasm of lymph nodes of head, face and neck: Secondary | ICD-10-CM | POA: Insufficient documentation

## 2022-05-05 DIAGNOSIS — I7121 Aneurysm of the ascending aorta, without rupture: Secondary | ICD-10-CM | POA: Insufficient documentation

## 2022-05-05 DIAGNOSIS — R918 Other nonspecific abnormal finding of lung field: Secondary | ICD-10-CM | POA: Insufficient documentation

## 2022-05-05 DIAGNOSIS — D485 Neoplasm of uncertain behavior of skin: Secondary | ICD-10-CM | POA: Diagnosis not present

## 2022-05-05 DIAGNOSIS — C4442 Squamous cell carcinoma of skin of scalp and neck: Secondary | ICD-10-CM | POA: Diagnosis not present

## 2022-05-05 DIAGNOSIS — I714 Abdominal aortic aneurysm, without rupture, unspecified: Secondary | ICD-10-CM | POA: Insufficient documentation

## 2022-05-05 HISTORY — PX: SKIN SURGERY: SHX2413

## 2022-05-05 LAB — TSH: TSH: 2.13 u[IU]/mL (ref 0.350–4.500)

## 2022-05-05 NOTE — Progress Notes (Signed)
Radiation Oncology         [phone]) [phone] ________________________________  Name: Gregory Carey MRN: [id number]  Date: 05/05/2022  DOB: 05-11-1960  Follow-Up Visit Note  CC: Sharion Balloon, FNP  Izora Gala, MD  Diagnosis and Prior Radiotherapy:       ICD-10-CM   1. 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   Narrative:   Gregory Carey presents today for follow-up after completing radiation to his oropharynx on 01/16/2021  CT of his neck from 05/01/22 show: Satisfactory post treatment appearance of the Neck; NI-RADS category 1; Most conspicuous cervical lymph node now is at level 1A, attention directed on follow-up.  CT of his chest from 05/01/22 show: No evidence of metastatic disease; Small pulmonary nodules, unchanged from 09/09/20 and therefore likely benign; 4.3 cm ascending aortic aneurysm, stable; aortic atherosclerosis; enlarged pulmonic trunk.   Pain issues, if any: Reports some pain to his left neck from recent skin surgery. Using a feeding tube?: N/a Weight changes, if any: He denies weight loss.  Reports he has put on a few pounds. Swallowing issues, if any: He reports normal swallowing, some occasional irritation in his throat. Smoking or chewing tobacco? None Using fluoride trays daily? Yes Last ENT visit was on: Dr. Constance Holster on 04-29-21, no scope performed Impression & Plans:  2 years posttreatment, no evidence of recurrent disease. Start follow-up every 4 months. Will review CT scan when available   BP 133/80 (BP Location: Left Arm, Patient Position: Sitting, Cuff Size: Normal)    Pulse (!) 56   Temp (!) 97.2 F (36.2 C)   Resp 20   Ht 5\' 11"  (1.803 m)   Wt 243 lb (110.2 kg)   SpO2 98%   BMI 33.89 kg/m      ALLERGIES:  has No Known Allergies.  Meds: Current Outpatient Medications  Medication Sig Dispense Refill   blood glucose meter kit and supplies Dispense based on patient and insurance preference. Use up to four times daily as directed. (FOR ICD-9 250.00, 250.01). 1 each 0   busPIRone (BUSPAR) 5 MG tablet Take 1 tablet (5 mg total) by mouth 3 (three) times daily as needed. (NEEDS TO BE SEEN BEFORE NEXT REFILL) 90 tablet 0   CONTOUR NEXT TEST test strip USE AS DIRECTED TWICE DAILY 100 each 0   lisinopril-hydrochlorothiazide (ZESTORETIC) 20-12.5 MG tablet Take 2 tablets by mouth once daily 180 tablet 0   metFORMIN (GLUCOPHAGE-XR) 500 MG 24 hr tablet Take 1 tablet by mouth once daily with breakfast 90 tablet 1   rosuvastatin (CRESTOR) 20 MG tablet Take 1 tablet (20 mg total) by mouth daily. 90 tablet 3   sodium fluoride (SODIUM FLUORIDE 5000 PPM) 1.1 % GEL dental gel Place 1 pea-size drop into each tooth space of fluoride trays once a day at bedtime.  Leave trays in for 5 minutes and then remove.  Spit out excess fluoride, but DO NOT rinse with water, eat or drink for at least 30 minutes after use. 120 mL 11   No current facility-administered medications for this encounter.    Physical  Findings: The patient is in no acute distress. Patient is alert and oriented. Wt Readings from Last 3 Encounters:  05/05/22 243 lb (110.2 kg)  03/31/22 237 lb 9.6 oz (107.8 kg)  12/30/21 235 lb (106.6 kg)    height is 5\' 11"  (1.803 m) and weight is 243 lb (110.2 kg). His temperature is 97.2 F (36.2 C) (abnormal). His blood pressure is 133/80 and his pulse is 56 (abnormal). His respiration is 20 and oxygen saturation is 98%. .  General: Alert and oriented, in no acute distress HEENT: Head is normocephalic. Extraocular movements are intact. Mouth/Oropharynx  notable for no  lesions Neck: Neck is notable for no enlarged masses - subtle submental mass appreciated Skin: Skin in treatment fields shows satisfactory healing  Lymphatics: see Neck Exam Psychiatric: Judgment and insight are intact. Affect is appropriate.   Lab Findings: Lab Results  Component Value Date   WBC 5.5 03/31/2022   HGB 15.8 03/31/2022   HCT 46.5 03/31/2022   MCV 87 03/31/2022   PLT 267 03/31/2022    Lab Results  Component Value Date   TSH 1.060 03/31/2022    Radiographic Findings: CT Chest W Contrast  Result Date: 05/04/2022 CLINICAL DATA:  Head/neck cancer.  * Tracking Code: BO * EXAM: CT CHEST WITH CONTRAST TECHNIQUE: Multidetector CT imaging of the chest was performed during intravenous contrast administration. RADIATION DOSE REDUCTION: This exam was performed according to the departmental dose-optimization program which includes automated exposure control, adjustment of the mA and/or kV according to patient size and/or use of iterative reconstruction technique. CONTRAST:  62mL OMNIPAQUE IOHEXOL 300 MG/ML  SOLN COMPARISON:  04/23/2021. FINDINGS: Cardiovascular: Atherosclerotic calcification of the aorta and coronary arteries. Ascending aorta measures up to 4.3 cm (4/69), stable. Enlarged pulmonic trunk. Heart is at the upper limits of normal in size. No pericardial effusion. Persistent left SVC. Mediastinum/Nodes: Calcified mediastinal and right hilar lymph nodes. No pathologically enlarged mediastinal, hilar or axillary lymph nodes. Esophagus is grossly unremarkable. Lungs/Pleura: 5 mm subpleural lateral right lower lobe nodule (6/139), unchanged from 09/09/2020. 4 mm nodule along the left major fissure, also unchanged and likely a benign subpleural lymph node. Calcified left lower lobe granuloma. Cylindrical bronchiectasis. No pleural fluid. Airway is unremarkable. Upper Abdomen: Visualized portions of the liver, gallbladder, adrenal glands, kidneys, spleen, pancreas, stomach and bowel  are grossly unremarkable. No upper abdominal adenopathy. Musculoskeletal: Degenerative changes in the spine. Left shoulder arthroplasty. No worrisome lytic or sclerotic lesions. IMPRESSION: 1. No evidence of metastatic disease. 2. Small pulmonary nodules, unchanged from 09/09/2020 and therefore likely benign. No specific follow-up is recommended other than on routine imaging. 3. Cylindrical bronchiectasis. 4. 4.3 cm ascending aortic aneurysm, stable. Recommend annual imaging followup by CTA or MRA. This recommendation follows 2010 ACCF/AHA/AATS/ACR/ASA/SCA/SCAI/SIR/STS/SVM Guidelines for the Diagnosis and Management of Patients with Thoracic Aortic Disease. Circulation. 2010; [id number]. Aortic aneurysm NOS (ICD10-I71.9). 5. Aortic atherosclerosis (ICD10-I70.0). Coronary artery calcification. 6. Enlarged pulmonic trunk, indicative of pulmonary arterial hypertension. Electronically Signed   By: Lorin Picket M.D.   On: 05/04/2022 13:03   CT Soft Tissue Neck W Contrast  Result Date: 05/04/2022 CLINICAL DATA:  62 year old male with a history of treated oropharyngeal squamous cell carcinoma diagnosed in 2022. Metastatic left level 2 lymph nodes at that time. Restaging. Subsequent encounter. EXAM: CT NECK WITH CONTRAST TECHNIQUE: Multidetector CT imaging of the neck was performed using the standard protocol following the bolus administration of intravenous contrast. RADIATION DOSE REDUCTION: This exam was performed according to the departmental dose-optimization  program which includes automated exposure control, adjustment of the mA and/or kV according to patient size and/or use of iterative reconstruction technique. CONTRAST:  89mL OMNIPAQUE IOHEXOL 300 MG/ML  SOLN COMPARISON:  Post treatment neck CT 04/23/2021, chest CT the same day reported separately. FINDINGS: Pharynx and larynx: Trace retained secretions in the nasopharynx and oropharynx. Mild motion artifact also. Laryngeal and pharyngeal soft tissue  contours otherwise appear stable since 2022. No discrete mass or hyperenhancement. Parapharyngeal and retropharyngeal spaces are within normal limits. Salivary glands: Atrophied versus absent left submandibular gland, stable from last year. Sublingual space remains normal. Right submandibular gland and bilateral parotid glands are stable and within normal limits. Thyroid: Diminutive, negative. Lymph nodes: Sequelae of left neck dissection. Left internal jugular vein is absent. Left sternocleidomastoid muscle remains in place. Regressed indistinct postoperative granulation tissue tracking along the left neck nodal stations when compared to last year. No residual left side lymph nodes identified. Midline level 1A 7 mm short axis no has increased compared to 2022, but was 5 mm last year and remains within normal limits (series 2, image 66). Right side cervical nodes are stable since 2022 and within normal limits. Vascular: Surgically absent left internal jugular vein. Other major vascular structures in the neck and at the skull base remain patent. Bilateral carotid bifurcation soft and calcified atherosclerotic plaque has not significantly changed since 2022. Limited intracranial: Negative. Visualized orbits: Negative. Mastoids and visualized paranasal sinuses: Scattered mild paranasal sinus mucosal thickening has not significantly changed since 2022. No paranasal sinus fluid levels. Tympanic cavities and mastoids are clear. Skeleton: Mandible intact. No acute dental findings. Chronic advanced cervical spine degeneration, chronic vacuum disc at C4-C5. Bulky chronic disc osteophyte complex at C6-C7. Multilevel right side degenerative sclerotic facet arthropathy. No acute or suspicious osseous lesion identified. Upper chest: Reported separately. IMPRESSION: 1. Satisfactory post treatment appearance of the Neck. NI-RADS category 1. Most conspicuous cervical lymph node now is at level 1A (series 2, image 66), attention  directed on follow-up. 2. Chest CT reported separately. Electronically Signed   By: Genevie Ann M.D.   On: 05/04/2022 09:49    Impression/Plan:    1) Head and Neck Cancer Status: Patient reports to be doing well. Recent CT shows enlarging submental node.  I personally reviewed his serial images.  Given that he reports a history of multiple skin cancers excised from his neck and his face, as well as a history of throat cancer, I would like to definitively rule out disease in this node.  I will send for IR guided biopsy to further evaluate.    Of note, aortic aneurysm is present on CT chest. Referral to cardiothoracic surgery placed today.  2) Nutritional Status: stable Wt Readings from Last 3 Encounters:  05/05/22 243 lb (110.2 kg)  03/31/22 237 lb 9.6 oz (107.8 kg)  12/30/21 235 lb (106.6 kg)    He is eating well  3) PEG tube?: none  4) Swallowing: good function, continues slp exercises  5) Dental: Encouraged to continue regular followup with dentistry, and dental hygiene including fluoride rinses.  He is using his fluoride trays intermittently and will try to do this nightly  6) Thyroid function: checking annually Lab Results  Component Value Date   TSH 1.060 03/31/2022    7) Other: I will follow up with patient after biopsy to go over results by phone. I will see him back in 78mo.  On date of service, in total, I spent 30 minutes on this encounter. Patient  was seen in person. _____________________________________   Leona Singleton, PA   Eppie Gibson, MD

## 2022-05-06 ENCOUNTER — Other Ambulatory Visit: Payer: Self-pay

## 2022-05-06 DIAGNOSIS — C01 Malignant neoplasm of base of tongue: Secondary | ICD-10-CM

## 2022-05-06 NOTE — Progress Notes (Signed)
Oncology Nurse Navigator Documentation   I met with Gregory Carey before his follow up with Dr. Isidore Moos today. He is doing well and has no needs at this time.   At Dr. Pearlie Oyster request I have placed a referral to IR for image guided biopsy of growing submental node on recent CT scan.  Gregory Carey and his wife know to call me if they have any needs in the future.  Gregory Asa RN, BSN, OCN Head & Neck Oncology Nurse [facility] Phone # [phone]  Fax # [phone]

## 2022-05-06 NOTE — Progress Notes (Signed)
Gregory Peaches, MD  Donita Brooks D; P Ir Procedure Requests Approved but needs changed to US guided biopsy.  Enlarging submental LN in the setting of prior head & neck cancer.  Please have cytology available for FNA as node is small and may be difficult to core.  [place]

## 2022-05-14 ENCOUNTER — Other Ambulatory Visit: Payer: Self-pay | Admitting: Radiology

## 2022-05-14 DIAGNOSIS — R591 Generalized enlarged lymph nodes: Secondary | ICD-10-CM

## 2022-05-19 ENCOUNTER — Other Ambulatory Visit: Payer: Self-pay | Admitting: Student

## 2022-05-19 DIAGNOSIS — C4442 Squamous cell carcinoma of skin of scalp and neck: Secondary | ICD-10-CM | POA: Diagnosis not present

## 2022-05-20 ENCOUNTER — Other Ambulatory Visit: Payer: Self-pay | Admitting: Internal Medicine

## 2022-05-20 ENCOUNTER — Other Ambulatory Visit: Payer: Self-pay

## 2022-05-20 ENCOUNTER — Ambulatory Visit (HOSPITAL_COMMUNITY)
Admission: RE | Admit: 2022-05-20 | Discharge: 2022-05-20 | Disposition: A | Discharge status: Home / Self Care | Payer: BC Managed Care – PPO | Source: Ambulatory Visit | Attending: Radiation Oncology | Admitting: Radiation Oncology

## 2022-05-20 DIAGNOSIS — Z85828 Personal history of other malignant neoplasm of skin: Secondary | ICD-10-CM | POA: Insufficient documentation

## 2022-05-20 DIAGNOSIS — C01 Malignant neoplasm of base of tongue: Secondary | ICD-10-CM | POA: Diagnosis not present

## 2022-05-20 DIAGNOSIS — R591 Generalized enlarged lymph nodes: Secondary | ICD-10-CM | POA: Diagnosis not present

## 2022-05-20 DIAGNOSIS — Z8589 Personal history of malignant neoplasm of other organs and systems: Secondary | ICD-10-CM | POA: Diagnosis not present

## 2022-05-20 DIAGNOSIS — R590 Localized enlarged lymph nodes: Secondary | ICD-10-CM | POA: Diagnosis not present

## 2022-05-20 LAB — GLUCOSE, CAPILLARY: Glucose-Capillary: 113 mg/dL — ABNORMAL HIGH (ref 70–99)

## 2022-05-20 MED ORDER — SODIUM CHLORIDE 0.9 % IV SOLN: INTRAVENOUS | Status: DC

## 2022-05-20 MED ORDER — LIDOCAINE HCL (PF) 1 % IJ SOLN
8.0000 mL | Freq: Once | INTRAMUSCULAR | Status: AC
  Administered 2022-05-20: 8 mL via INTRADERMAL

## 2022-05-20 NOTE — Progress Notes (Signed)
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  He recently underwent CT of the chest for surveillance showed no evidence of metastatic disease.  There was however pulmonary nodules that remain stable since 2022 and a 4.3 cm Ascending Aortic Aneurysm.  Due to this he has been sent to our office for surgical surveillance.  The patient denies chest pain, shortness of breath.  He denies history of nicotine abuse.  He thinks his grandmother had an aneurysm in her abdomen.  He is compliant with his blood pressure medications.  Current Outpatient Medications on File Prior to Visit  Medication Sig Dispense Refill   acetaminophen (TYLENOL) 325 MG tablet Take 650 mg by mouth every 6 (six) hours as needed.     blood glucose meter kit and supplies Dispense based on patient and insurance preference. Use up to four times daily as directed. (FOR ICD-9 250.00, 250.01). 1 each 0   busPIRone (BUSPAR) 5 MG tablet Take 1 tablet (5 mg total) by mouth 3 (three) times daily as needed. (NEEDS TO BE SEEN BEFORE NEXT REFILL) 90 tablet 0   CONTOUR NEXT TEST test strip USE AS DIRECTED TWICE DAILY 100 each 0   lisinopril-hydrochlorothiazide (ZESTORETIC) 20-12.5 MG tablet Take 2 tablets by mouth once daily 180 tablet 0   metFORMIN (GLUCOPHAGE-XR) 500 MG 24 hr tablet Take 1 tablet by mouth once daily with breakfast 90 tablet 1   rosuvastatin (CRESTOR) 20 MG tablet Take 1 tablet (20 mg total) by mouth daily. 90 tablet 3   sodium fluoride (SODIUM FLUORIDE 5000 PPM) 1.1 % GEL dental gel Place 1 pea-size drop into each tooth space of fluoride trays once a day at bedtime.  Leave trays in for 5 minutes and then remove.  Spit out excess fluoride, but DO NOT rinse with water, eat or drink for at least 30 minutes after  use. 120 mL 11   No current facility-administered medications on file prior to visit.     BP (!) 156/91 (BP Location: Right Arm, Patient Position: Sitting)   Pulse 65   Resp 20   Ht 5\' 11"  (1.803 m)   Wt 244 lb (110.7 kg)   SpO2 96% Comment: Ra  BMI 34.03 kg/m   Physical Exam  Gen: NAD  Heart: RRR Lungs: CTA bilaterally Neck: No Bruit Ext:No edema Neuro:grossly intact  CT Chest Results:  CLINICAL DATA:  Head/neck cancer.  * Tracking Code: BO *   EXAM: CT CHEST WITH CONTRAST   TECHNIQUE: Multidetector CT imaging of the chest was performed during intravenous contrast administration.   RADIATION DOSE REDUCTION: This exam was performed according to the departmental dose-optimization program which includes automated exposure control, adjustment of the mA and/or kV according to patient size and/or use of iterative reconstruction technique.   CONTRAST:  75mL OMNIPAQUE IOHEXOL 300 MG/ML  SOLN   COMPARISON:  04/23/2021.   FINDINGS: Cardiovascular: Atherosclerotic calcification of the aorta and coronary arteries. Ascending aorta measures up to 4.3 cm (4/69), stable. Enlarged pulmonic trunk. Heart is at the upper limits of normal in size. No pericardial effusion. Persistent left SVC.   Mediastinum/Nodes: Calcified mediastinal and right hilar lymph nodes. No pathologically  enlarged mediastinal, hilar or axillary lymph nodes. Esophagus is grossly unremarkable.   Lungs/Pleura: 5 mm subpleural lateral right lower lobe nodule (6/139), unchanged from 09/09/2020. 4 mm nodule along the left major fissure, also unchanged and likely a benign subpleural lymph node. Calcified left lower lobe granuloma. Cylindrical bronchiectasis. No pleural fluid. Airway is unremarkable.   Upper Abdomen: Visualized portions of the liver, gallbladder, adrenal glands, kidneys, spleen, pancreas, stomach and bowel are grossly unremarkable. No upper abdominal adenopathy.   Musculoskeletal:  Degenerative changes in the spine. Left shoulder arthroplasty. No worrisome lytic or sclerotic lesions.   IMPRESSION: 1. No evidence of metastatic disease. 2. Small pulmonary nodules, unchanged from 09/09/2020 and therefore likely benign. No specific follow-up is recommended other than on routine imaging. 3. Cylindrical bronchiectasis. 4. 4.3 cm ascending aortic aneurysm, stable. Recommend annual imaging followup by CTA or MRA. This recommendation follows 2010 ACCF/AHA/AATS/ACR/ASA/SCA/SCAI/SIR/STS/SVM Guidelines for the Diagnosis and Management of Patients with Thoracic Aortic Disease. Circulation. 2010; 121: H419-F790. Aortic aneurysm NOS (ICD10-I71.9). 5. Aortic atherosclerosis (ICD10-I70.0). Coronary artery calcification. 6. Enlarged pulmonic trunk, indicative of pulmonary arterial hypertension.     Electronically Signed   By: Leanna Battles M.D.   On: 05/04/2022 13:03   A/P:  Ascending Aortic Aneurysm- measuring 4.3 cm Pulmonary Nodules- stable since 2022 HTN HLD DM H/O Neck Cancer from HPV  Plan: patient with 4.3 cm Ascending Aortic Aneurysm.  Stable Pulmonary nodules since 2022... Patient will require yearly surveillance.. will have patient return to clinic in 1 year for repeat CT of the chest  Risk Modification:  Statin:  Yes  Smoking cessation instruction/counseling given:  Never Smoker  Patient was counseled on importance of Blood Pressure Control.  Despite Medical intervention if the patient notices persistently elevated blood pressure readings.  They are instructed to contact their Primary Care Physician  Please avoid use of Fluoroquinolones as this can potentially increase your risk of Aortic Rupture and/or Dissection  Patient educated on signs and symptoms of Aortic Dissection, handout also provided in AVS  Poppi Scantling, PA-C 05/27/22

## 2022-05-20 NOTE — Procedures (Signed)
Vascular and Interventional Radiology Procedure Note  Patient: Gregory Carey DOB: 02/22/1960 Medical Record Number: [id number] Note Date/Time: 05/20/22 12:50 PM   Performing Physician: Michaelle Birks, MD Assistant(s): None  Diagnosis: Hx of H&N CA w enlarged cervical LN   Procedure: CERVICAL (SUB MENTAL) LYMPH NODE BIOPSY  Anesthesia: Local Anesthetic Complications: None Estimated Blood Loss: Minimal Specimens: Sent for Pathology  Findings:  Successful Ultrasound-guided biopsy of SUBMENTAL cervical LN . A total of 3 samples were obtained. Hemostasis of the tract was achieved using Manual Pressure.  Plan: Bed rest for 0 hours.  See detailed procedure note with images in PACS. The patient tolerated the procedure well without incident or complication and was returned to Recovery in stable condition.    Michaelle Birks, MD [facility]   Pager. [place]

## 2022-05-22 LAB — SURGICAL PATHOLOGY

## 2022-05-27 ENCOUNTER — Institutional Professional Consult (permissible substitution) (INDEPENDENT_AMBULATORY_CARE_PROVIDER_SITE_OTHER): Payer: BC Managed Care – PPO | Admitting: Physician Assistant

## 2022-05-27 VITALS — BP 156/91 | HR 65 | Resp 20 | Ht 71.0 in | Wt 244.0 lb

## 2022-05-27 DIAGNOSIS — I7121 Aneurysm of the ascending aorta, without rupture: Secondary | ICD-10-CM

## 2022-05-29 ENCOUNTER — Encounter: Payer: Self-pay | Admitting: Radiation Oncology

## 2022-05-29 NOTE — Progress Notes (Signed)
I left a message on the patient's voicemail informing him that his nodal biopsy did not show any evidence of carcinoma.  I had not received the results in my in-basket and it is possible that somebody already gave him this information, but I wanted to make sure he was aware before the weekend. -----------------------------------  Lonie Peak, MD

## 2022-06-02 DIAGNOSIS — M170 Bilateral primary osteoarthritis of knee: Secondary | ICD-10-CM | POA: Diagnosis not present

## 2022-06-16 ENCOUNTER — Telehealth: Payer: Self-pay | Admitting: Radiation Oncology

## 2022-06-16 NOTE — Telephone Encounter (Signed)
patient called to check if he had an appointment today.

## 2022-06-27 ENCOUNTER — Other Ambulatory Visit: Payer: Self-pay | Admitting: Family

## 2022-06-27 DIAGNOSIS — I10 Essential (primary) hypertension: Secondary | ICD-10-CM

## 2022-07-15 DIAGNOSIS — M19011 Primary osteoarthritis, right shoulder: Secondary | ICD-10-CM | POA: Diagnosis not present

## 2022-08-24 DIAGNOSIS — L821 Other seborrheic keratosis: Secondary | ICD-10-CM | POA: Diagnosis not present

## 2022-08-24 DIAGNOSIS — D225 Melanocytic nevi of trunk: Secondary | ICD-10-CM | POA: Diagnosis not present

## 2022-08-24 DIAGNOSIS — L814 Other melanin hyperpigmentation: Secondary | ICD-10-CM | POA: Diagnosis not present

## 2022-08-24 DIAGNOSIS — Z08 Encounter for follow-up examination after completed treatment for malignant neoplasm: Secondary | ICD-10-CM | POA: Diagnosis not present

## 2022-08-24 DIAGNOSIS — L570 Actinic keratosis: Secondary | ICD-10-CM | POA: Diagnosis not present

## 2022-09-19 ENCOUNTER — Other Ambulatory Visit: Payer: Self-pay | Admitting: Family

## 2022-09-19 DIAGNOSIS — E1165 Type 2 diabetes mellitus with hyperglycemia: Secondary | ICD-10-CM

## 2022-09-29 ENCOUNTER — Other Ambulatory Visit: Payer: Self-pay | Admitting: Family

## 2022-09-29 DIAGNOSIS — I10 Essential (primary) hypertension: Secondary | ICD-10-CM

## 2022-10-08 ENCOUNTER — Other Ambulatory Visit: Payer: Self-pay | Admitting: Family

## 2022-10-08 DIAGNOSIS — E785 Hyperlipidemia, unspecified: Secondary | ICD-10-CM

## 2022-10-10 IMAGING — CT CT NECK W/ CM
4 of 6 series · 12 of 33 positions shown, 14 images · IV contrast (OMNIPAQUE)
Comparison: CT neck 09/11/2020

CLINICAL DATA: History of metastatic squamous cell carcinoma of
oropharynx, radiation completed 01/16/2021.

EXAM:
CT NECK WITH CONTRAST
TECHNIQUE: Multidetector CT imaging of the neck was performed using the
standard protocol following the bolus administration of intravenous
contrast.

[Series 1: axial neck · axial · 0.39mm/px · z∈[-202,-116]mm · 2 of 130 slices shown]
[im 44/130  bone]
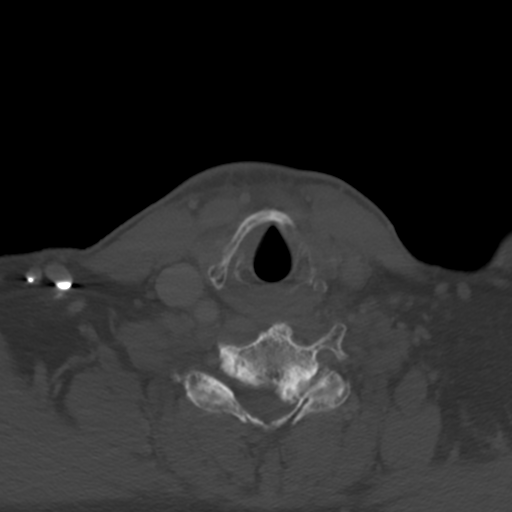
[im 87/130  bone]
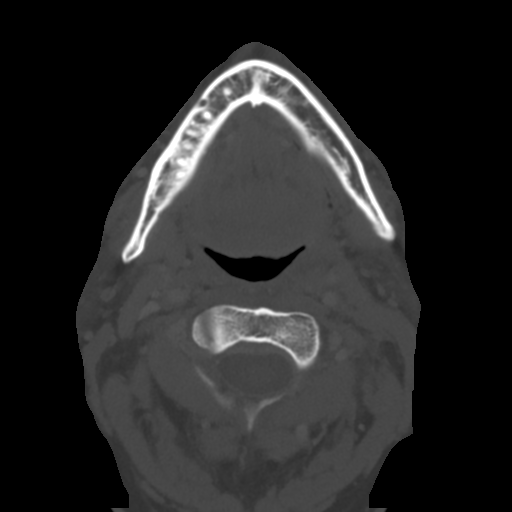

[Series 503: axial reformats · axial · 0.51mm/px · z∈[-205,-119]mm · 2 of 131 slices shown, 3 images]
[im 44/131  soft-tissue]
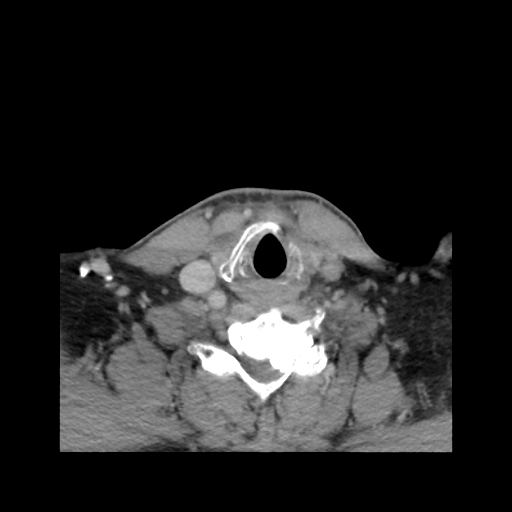
[im 44/131  bone]
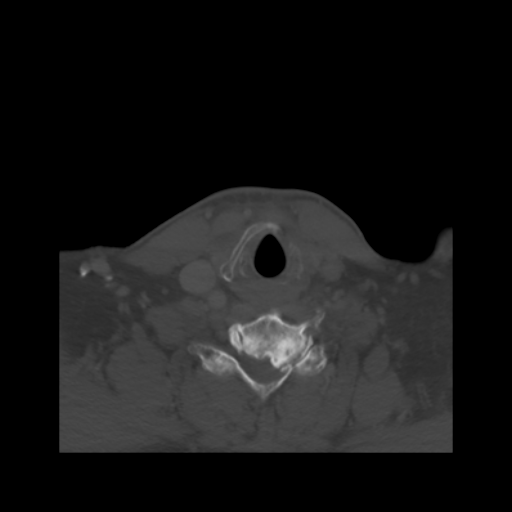
[im 87/131  bone]
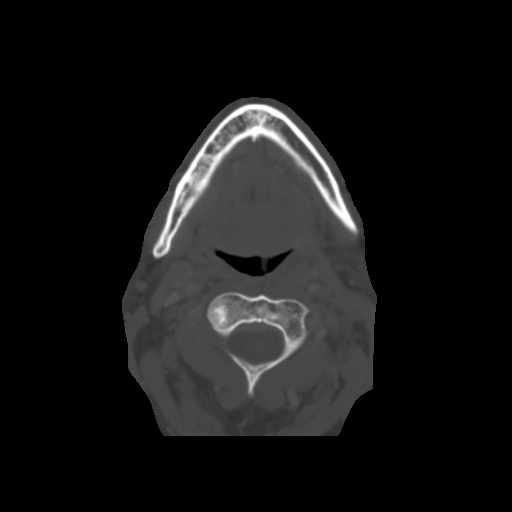

[Series 504: coronal images · coronal · 0.51mm/px · 3 of 101 slices shown]
[im 21/101  bone]
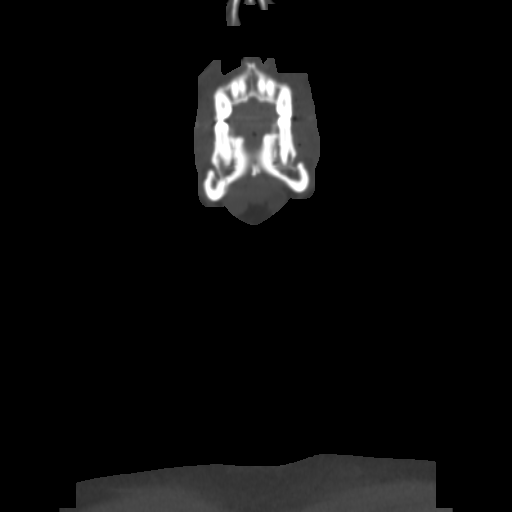
[im 41/101  bone]
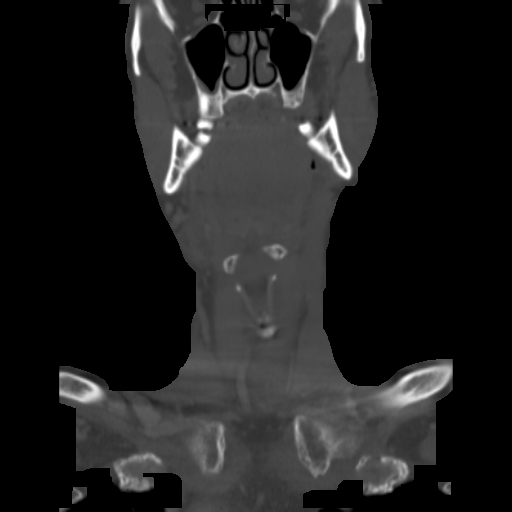
[im 61/101  bone]
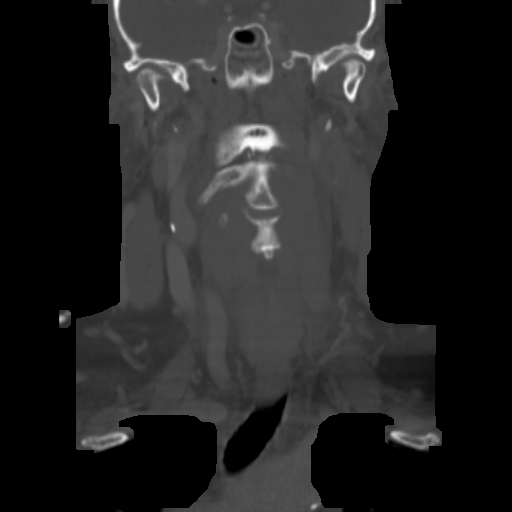

[Series 505: sagittal · sagittal · 0.51mm/px · 5 of 101 slices shown, 6 images]
[im 34/101  bone]
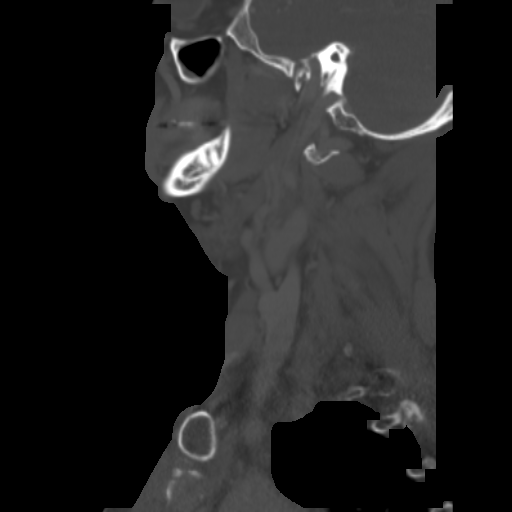
[im 42/101  bone]
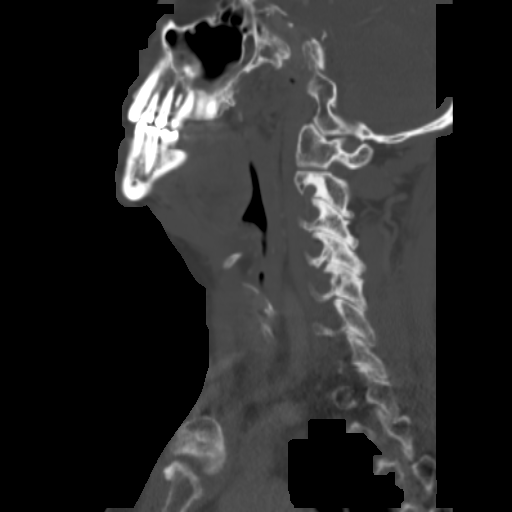
[im 51/101  soft-tissue]
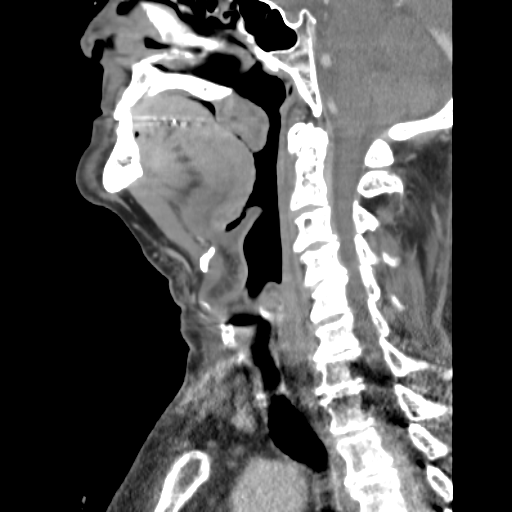
[im 51/101  bone]
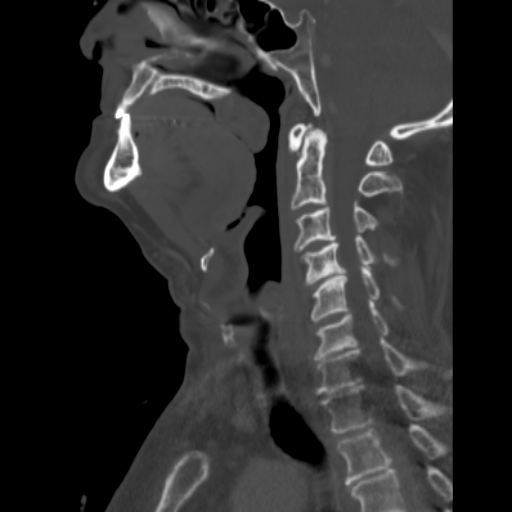
[im 59/101  bone]
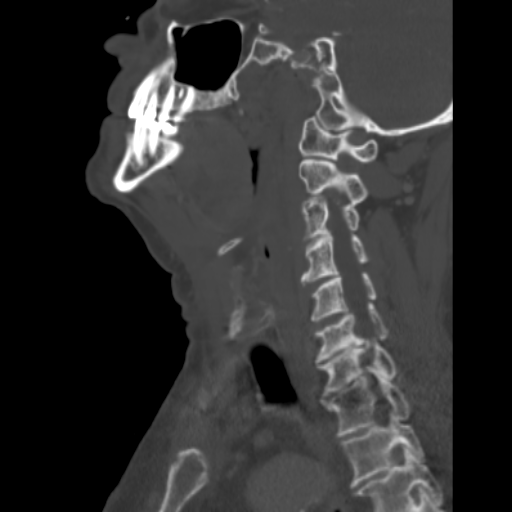
[im 67/101  bone]
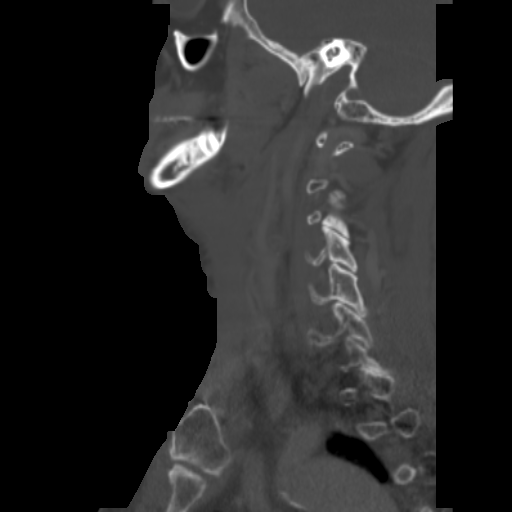

[12 of 33 positions shown; findings below may reference images not displayed]

RADIATION DOSE REDUCTION: This exam was performed according to the
departmental dose-optimization program which includes automated
exposure control, adjustment of the mA and/or kV according to
patient size and/or use of iterative reconstruction technique.

CONTRAST:  75mL OMNIPAQUE IOHEXOL 300 MG/ML  SOLN
FINDINGS: Pharynx and larynx: The nasopharynx and nasal cavity are
unremarkable.

No suspicious lesions or enhancement is seen in the oral cavity or
oropharynx. There is mild prominence of the lingual tonsils with no
discrete lesion identified. There is mild edema in the oropharyngeal
mucosa and epiglottis, favored to reflect posttreatment change.
There is a small amount of fluid in the left retropharynx.

The vocal folds are unremarkable in appearance.

Salivary glands: The parotid glands are unremarkable. The left
submandibular gland is surgically absent. The right submandibular
gland is unremarkable.

Thyroid: Unremarkable.

Lymph nodes: Previously seen enlarged left level II lymph nodes are
no longer identified. There is no pathologic lymphadenopathy in the
neck on the current study.

Vascular: The left internal jugular vein is now surgically absent.
There is minimal calcified plaque in the carotid bulbs. The major
vessels of the neck are otherwise unremarkable.

Limited intracranial: The imaged intracranial compartment is
unremarkable.

Visualized orbits: The imaged globes and orbits are unremarkable.

Mastoids and visualized paranasal sinuses: The imaged paranasal
sinuses and mastoid air cells are clear.

Skeleton: There is multilevel degenerative change of the cervical
spine, most advanced at C4-C5 and C6-C7. A sclerotic lesion in the
right aspect of the C2 vertebral body is unchanged, most likely
reflecting a bone island. There is no acute osseous abnormality or
aggressive osseous lesion.

Upper chest: The lungs are assessed on the separately dictated CT
chest.

Other: Postsurgical changes reflecting left neck dissection are
seen. There is soft tissue thickening throughout the left neck with
mild stranding in the subcutaneous fat.
IMPRESSION: 1. Postsurgical changes reflecting interval left neck dissection.
Soft tissue thickening and stranding in the subcutaneous fat
throughout the left neck is favored to reflect
postsurgical/postradiation change. Additionally,
oropharyngeal/retropharyngeal edema and edema of the epiglottis is
favored to reflect posttreatment change. No suspicious lesion or
enhancement.
2. No pathologic lymphadenopathy in the neck.

## 2022-10-10 IMAGING — CT CT CHEST W/ CM
2 of 4 series · 14 of 36 positions shown, 17 images · IV contrast (agent unspecified)
Comparison: PET-CT 09/09/2020.

CLINICAL DATA: Head and neck cancer. Radiation therapy completed.
No current complaints. * onc *

EXAM:
CT CHEST WITH CONTRAST
TECHNIQUE: Multidetector CT imaging of the chest was performed during
intravenous contrast administration.

[Series 4: coronal · coronal · 0.64mm/px · 3 of 159 slices shown]
[im 32/159  lung]
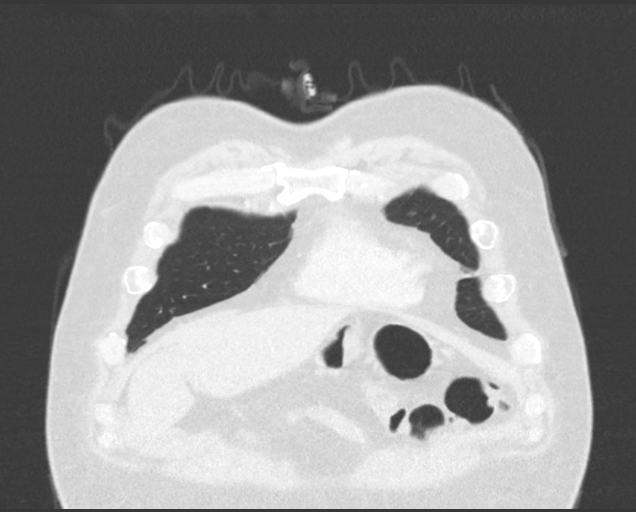
[im 64/159  lung]
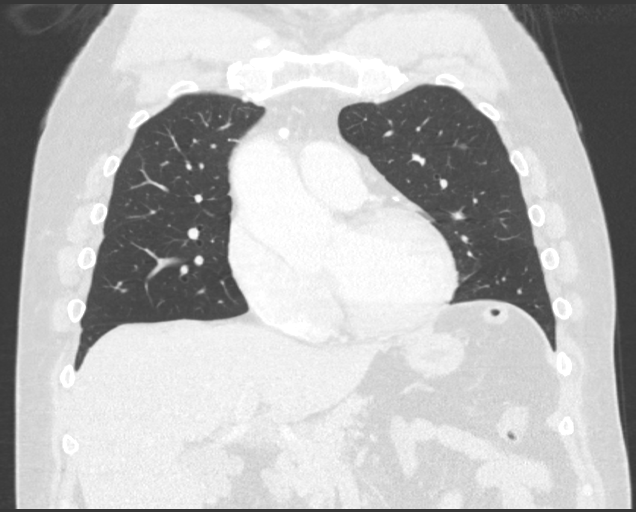
[im 95/159  lung]
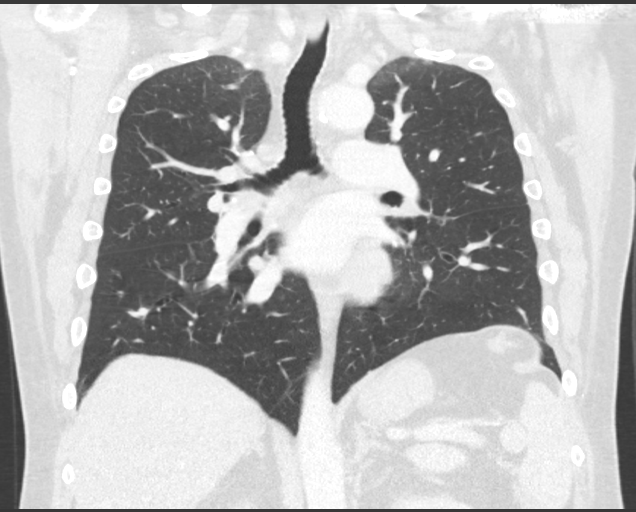

[Series 6: lungs · axial · 0.89mm/px · z∈[-520,-232]mm · 11 of 162 slices shown, 14 images]
[im 9/162  mediastinal]
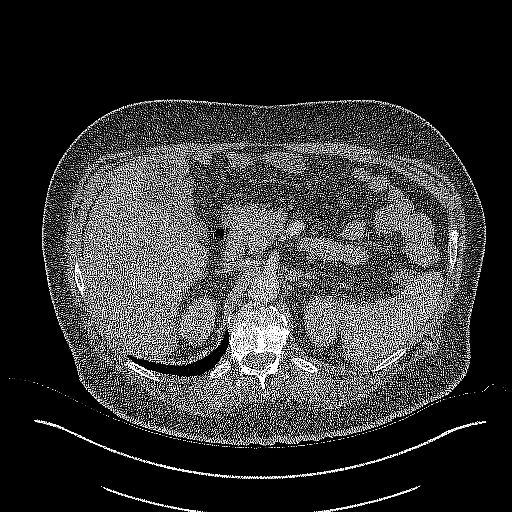
[im 9/162  lung]
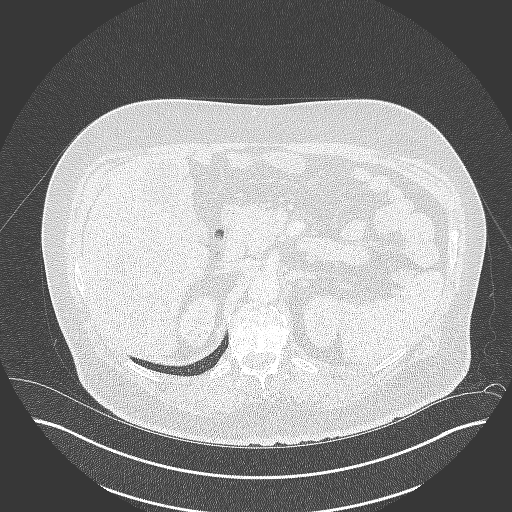
[im 26/162  lung]
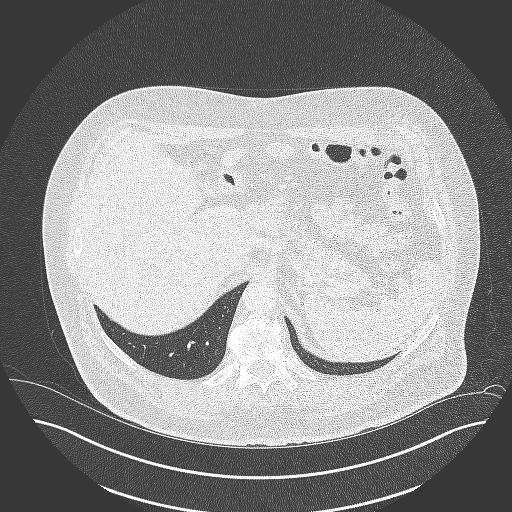
[im 43/162  lung]
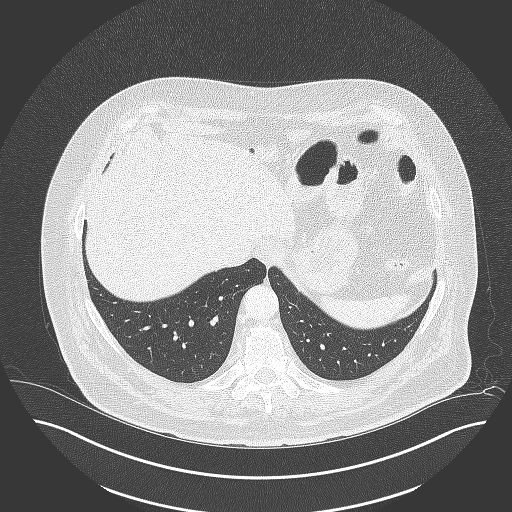
[im 51/162  lung]
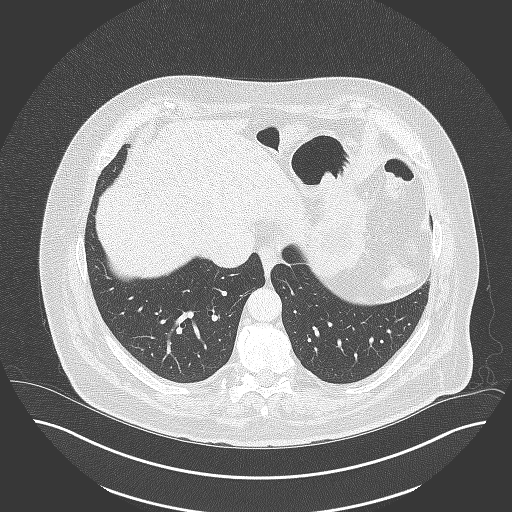
[im 68/162  mediastinal]
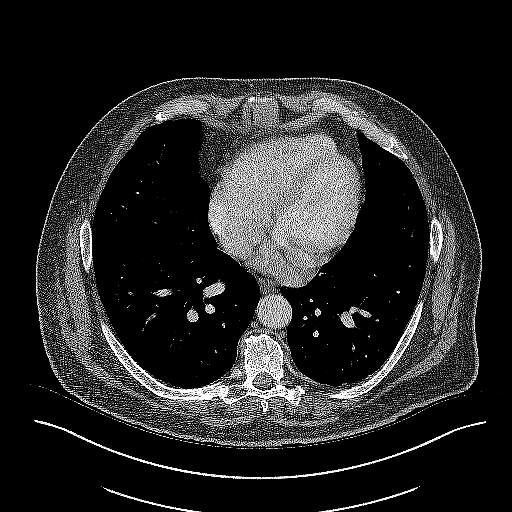
[im 68/162  lung]
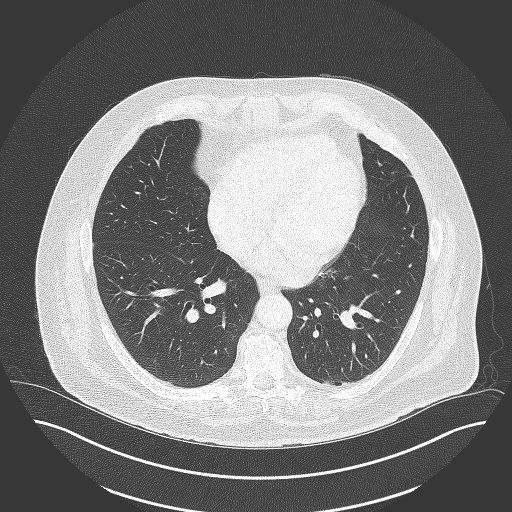
[im 85/162  lung]
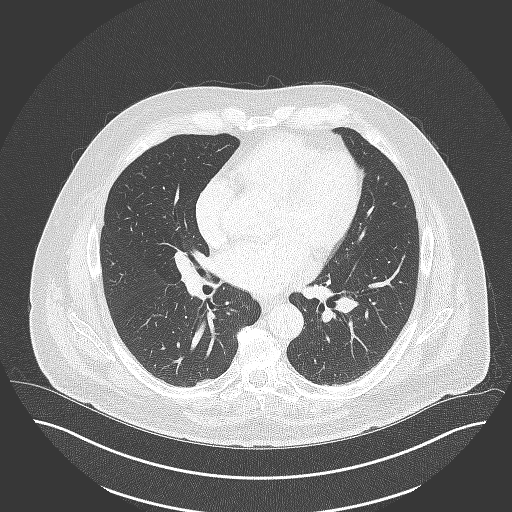
[im 94/162  lung]
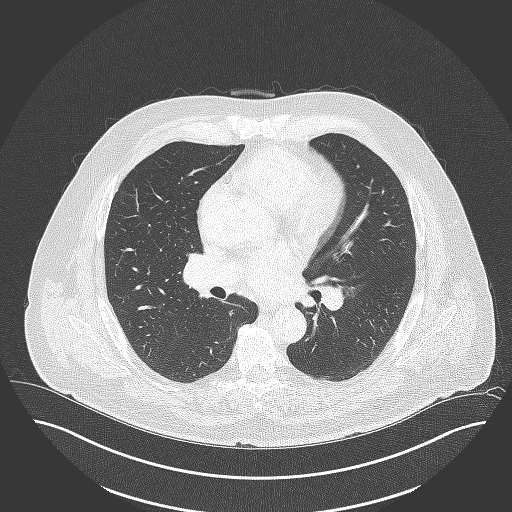
[im 111/162  lung]
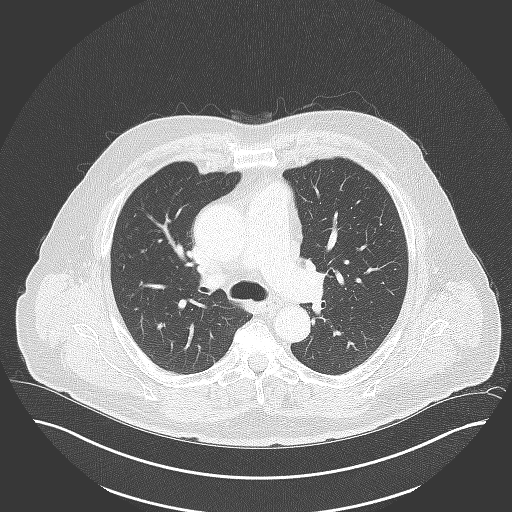
[im 119/162  mediastinal]
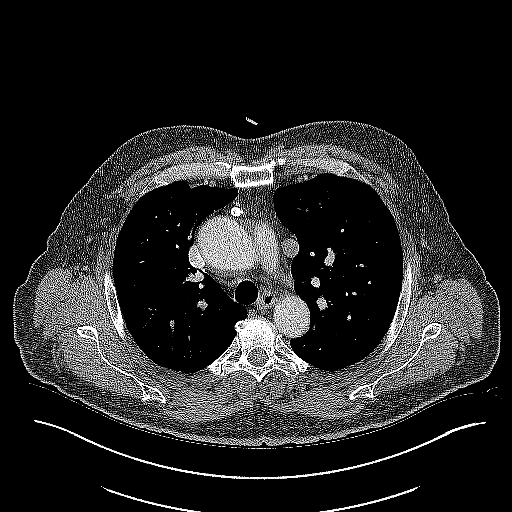
[im 119/162  lung]
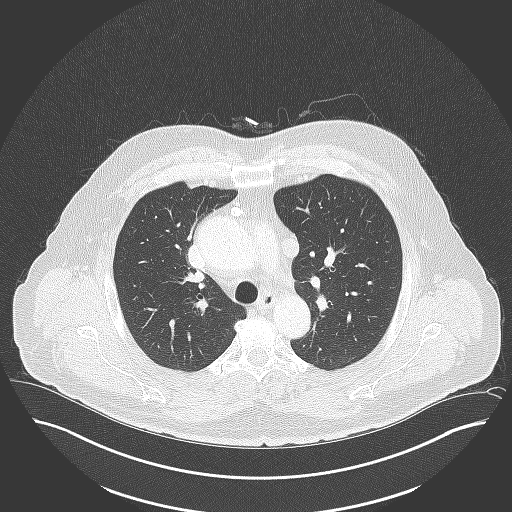
[im 136/162  lung]
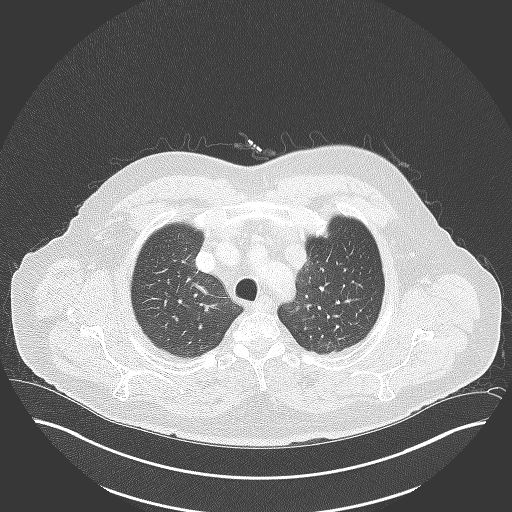
[im 153/162  lung]
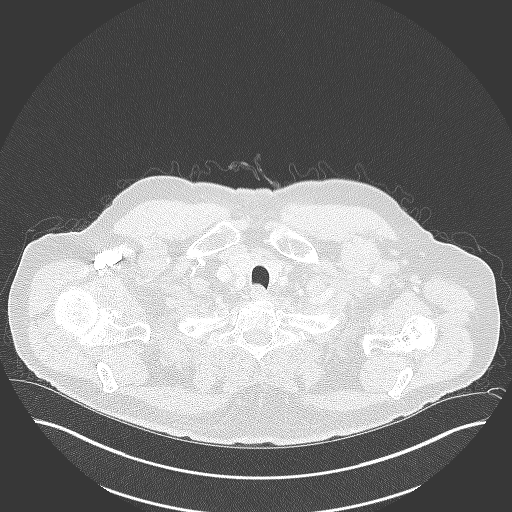

[14 of 36 positions shown; findings below may reference images not displayed]

RADIATION DOSE REDUCTION: This exam was performed according to the
departmental dose-optimization program which includes automated
exposure control, adjustment of the mA and/or kV according to
patient size and/or use of iterative reconstruction technique.

CONTRAST:  75mL OMNIPAQUE IOHEXOL 300 MG/ML  SOLN
FINDINGS: Neck findings are dictated separately.

Cardiovascular: Atherosclerosis of the aorta, great vessels and
coronary arteries again noted with similar dilatation of the
ascending aorta to approximately 4.6 cm. No acute vascular findings
are identified. There is no evidence of acute pulmonary embolism.
Persistent left-sided SVC noted (normal variant). The heart size is
normal. There is no pericardial effusion.

Mediastinum/Nodes: There are no enlarged mediastinal, hilar or
axillary lymph nodes.There are scattered small calcified mediastinal
and hilar lymph nodes. The thyroid gland, trachea and esophagus
demonstrate no significant findings.

Lungs/Pleura: No pleural effusion or pneumothorax. Scattered small
pulmonary nodules are unchanged, including calcified granulomas in
the right lower lobe. No suspicious pulmonary nodules are
identified. There are new subpleural densities at both lung apices
attributed to interval radiation therapy.

Upper abdomen: Calcified granulomas in the spleen. The visualized
upper abdomen otherwise appears unremarkable.

Musculoskeletal/Chest wall: There is no chest wall mass or
suspicious osseous finding. Previous left shoulder arthroplasty and
mild thoracic spondylosis are noted.
IMPRESSION: 1. No evidence of thoracic metastatic disease.
2. Sequela of prior granulomatous disease with scattered calcified
granulomas and lymph nodes. No suspicious pulmonary nodules.
3. Mild radiation changes at both lung apices.
4. Similar dilatation of the ascending aorta to approximately
cm. Recommend semi-annual imaging followup by CTA or MRA and
referral to cardiothoracic surgery if not already obtained. This
recommendation follows 5444
ACCF/AHA/AATS/ACR/ASA/SCA/MAHORI/SEBASTIANUS/GOFF/IRENA Guidelines for the
Diagnosis and Management of Patients With Thoracic Aortic Disease.
Circulation. 5444; 121: [id number]. Coronary and Aortic
Atherosclerosis (C4F56-DI7.7).

## 2022-10-26 ENCOUNTER — Ambulatory Visit (INDEPENDENT_AMBULATORY_CARE_PROVIDER_SITE_OTHER): Payer: BC Managed Care – PPO

## 2022-10-26 DIAGNOSIS — Z794 Long term (current) use of insulin: Secondary | ICD-10-CM

## 2022-10-26 DIAGNOSIS — E1169 Type 2 diabetes mellitus with other specified complication: Secondary | ICD-10-CM | POA: Diagnosis not present

## 2022-10-26 LAB — HM DIABETES EYE EXAM

## 2022-10-26 NOTE — Progress Notes (Signed)
Gregory Carey arrived 10/26/2022 and has given verbal consent to obtain images and complete their overdue diabetic retinal screening.  The images have been sent to an ophthalmologist or optometrist for review and interpretation.  Results will be sent back to Gregory Spencer, FNP for review.  Patient has been informed they will be contacted when we receive the results via telephone or MyChart

## 2022-11-03 ENCOUNTER — Other Ambulatory Visit: Payer: Self-pay | Admitting: Family

## 2022-11-03 ENCOUNTER — Encounter: Payer: Self-pay | Admitting: Family

## 2022-11-03 DIAGNOSIS — E785 Hyperlipidemia, unspecified: Secondary | ICD-10-CM

## 2022-11-03 NOTE — Telephone Encounter (Signed)
[facility] pt NTBS 30-d given 10/09/22

## 2022-11-03 NOTE — Telephone Encounter (Signed)
Lmtcb. Letter mailed

## 2022-11-04 DIAGNOSIS — M170 Bilateral primary osteoarthritis of knee: Secondary | ICD-10-CM | POA: Diagnosis not present

## 2022-12-18 ENCOUNTER — Ambulatory Visit: Payer: BC Managed Care – PPO | Admitting: Family

## 2022-12-18 VITALS — BP 140/88 | HR 63 | Temp 98.3°F | Ht 71.0 in | Wt 257.0 lb

## 2022-12-18 DIAGNOSIS — F411 Generalized anxiety disorder: Secondary | ICD-10-CM

## 2022-12-18 DIAGNOSIS — I10 Essential (primary) hypertension: Secondary | ICD-10-CM

## 2022-12-18 DIAGNOSIS — E1165 Type 2 diabetes mellitus with hyperglycemia: Secondary | ICD-10-CM

## 2022-12-18 DIAGNOSIS — Z23 Encounter for immunization: Secondary | ICD-10-CM | POA: Diagnosis not present

## 2022-12-18 DIAGNOSIS — Z923 Personal history of irradiation: Secondary | ICD-10-CM

## 2022-12-18 DIAGNOSIS — Z794 Long term (current) use of insulin: Secondary | ICD-10-CM

## 2022-12-18 DIAGNOSIS — Z96612 Presence of left artificial shoulder joint: Secondary | ICD-10-CM

## 2022-12-18 DIAGNOSIS — H6123 Impacted cerumen, bilateral: Secondary | ICD-10-CM | POA: Diagnosis not present

## 2022-12-18 DIAGNOSIS — M1711 Unilateral primary osteoarthritis, right knee: Secondary | ICD-10-CM

## 2022-12-18 DIAGNOSIS — E785 Hyperlipidemia, unspecified: Secondary | ICD-10-CM

## 2022-12-18 DIAGNOSIS — G4733 Obstructive sleep apnea (adult) (pediatric): Secondary | ICD-10-CM

## 2022-12-18 LAB — BAYER DCA HB A1C WAIVED: HB A1C (BAYER DCA - WAIVED): 6.4 % — ABNORMAL HIGH (ref 4.8–5.6)

## 2022-12-18 MED ORDER — LISINOPRIL-HYDROCHLOROTHIAZIDE 20-12.5 MG PO TABS: 2.0000 | ORAL_TABLET | Freq: Every day | ORAL | 1 refills | Status: DC

## 2022-12-18 MED ORDER — METFORMIN HCL ER 500 MG PO TB24: 500.0000 mg | ORAL_TABLET | Freq: Every day | ORAL | 2 refills | Status: DC

## 2022-12-18 MED ORDER — BUSPIRONE HCL 5 MG PO TABS: 5.0000 mg | ORAL_TABLET | Freq: Three times a day (TID) | ORAL | 2 refills | Status: DC | PRN

## 2022-12-18 MED ORDER — ROSUVASTATIN CALCIUM 20 MG PO TABS: 20.0000 mg | ORAL_TABLET | Freq: Every day | ORAL | 1 refills | Status: DC

## 2022-12-18 NOTE — Progress Notes (Signed)
Subjective:    Patient ID: Gregory Carey, male    DOB: [birth year], 61 y.o.   MRN: [id number]  Chief Complaint  Patient presents with   Medical Management of Chronic Issues   Pt presents today for chronic follow up. He has metastatic squamous neck cancer. He had surgery on 10/23/20 for laryngoscopy and biopsy. He completed radiation. He is followed by Oncologists.    He has OSA and uses CPAP nightly.   He is morbid obese with BMI of 35 with DM and HTN.   Hypertension This is a chronic problem. The current episode started more than 1 year ago. The problem has been resolved since onset. The problem is controlled. Associated symptoms include anxiety. Pertinent negatives include no blurred vision, malaise/fatigue, peripheral edema or shortness of breath. Risk factors for coronary artery disease include dyslipidemia, diabetes mellitus, male gender and sedentary lifestyle. The current treatment provides moderate improvement.  Diabetes He presents for his follow-up diabetic visit. He has type 2 diabetes mellitus. Hypoglycemia symptoms include nervousness/anxiousness. Pertinent negatives for diabetes include no blurred vision and no foot paresthesias. Risk factors for coronary artery disease include diabetes mellitus, dyslipidemia, hypertension, sedentary lifestyle and post-menopausal. He is following a generally healthy diet. His overall blood glucose range is 130-140 mg/dl.  Arthritis Presents for follow-up visit. He complains of pain and stiffness. Affected locations include the right knee, left knee, left shoulder and right shoulder. His pain is at a severity of 6/10.  Hyperlipidemia This is a chronic problem. The current episode started more than 1 year ago. The problem is controlled. Recent lipid tests were reviewed and are normal. Exacerbating diseases include obesity. Pertinent negatives include no shortness of breath. Current antihyperlipidemic treatment includes statins. The current  treatment provides moderate improvement of lipids. Risk factors for coronary artery disease include dyslipidemia, hypertension, male sex, a sedentary lifestyle and diabetes mellitus.  Anxiety Presents for follow-up visit. Symptoms include excessive worry and nervous/anxious behavior. Patient reports no shortness of breath. Symptoms occur occasionally. The severity of symptoms is moderate.        Review of Systems  Constitutional:  Negative for malaise/fatigue.  Eyes:  Negative for blurred vision.  Respiratory:  Negative for shortness of breath.   Musculoskeletal:  Positive for arthritis and stiffness.  Psychiatric/Behavioral:  The patient is nervous/anxious.   All other systems reviewed and are negative.      Objective:   Physical Exam Vitals reviewed.  Constitutional:      General: He is not in acute distress.    Appearance: He is well-developed. He is obese.  HENT:     Head: Normocephalic.     Right Ear: There is impacted cerumen.     Left Ear: There is impacted cerumen.  Eyes:     General:        Right eye: No discharge.        Left eye: No discharge.     Pupils: Pupils are equal, round, and reactive to light.  Neck:     Thyroid: No thyromegaly.  Cardiovascular:     Rate and Rhythm: Normal rate and regular rhythm.     Heart sounds: Normal heart sounds. No murmur heard. Pulmonary:     Effort: Pulmonary effort is normal. No respiratory distress.     Breath sounds: Normal breath sounds. No wheezing.  Abdominal:     General: Bowel sounds are normal. There is no distension.     Palpations: Abdomen is soft.     Tenderness: There  is no abdominal tenderness.  Musculoskeletal:        General: No tenderness. Normal range of motion.     Cervical back: Normal range of motion and neck supple.  Skin:    General: Skin is warm and dry.     Findings: No erythema or rash.  Neurological:     Mental Status: He is alert and oriented to person, place, and time.     Cranial Nerves:  No cranial nerve deficit.     Deep Tendon Reflexes: Reflexes are normal and symmetric.  Psychiatric:        Behavior: Behavior normal.        Thought Content: Thought content normal.        Judgment: Judgment normal.     Bilateral ears washed with warm water and peroxide, TM WNL  BP (!) 146/87   Pulse 63   Temp 98.3 F (36.8 C) (Temporal)   Ht 5\' 11"  (1.803 m)   Wt 257 lb (116.6 kg)   SpO2 94%   BMI 35.84 kg/m      Assessment & Plan:  Gregory Carey comes in today with chief complaint of Medical Management of Chronic Issues   Diagnosis and orders addressed:  1. GAD (generalized anxiety disorder) - busPIRone (BUSPAR) 5 MG tablet; Take 1 tablet (5 mg total) by mouth 3 (three) times daily as needed. (NEEDS TO BE SEEN BEFORE NEXT REFILL)  Dispense: 90 tablet; Refill: 2 - CBC with Differential/Platelet - Comprehensive metabolic panel  2. Essential hypertension - lisinopril-hydrochlorothiazide (ZESTORETIC) 20-12.5 MG tablet; Take 2 tablets by mouth daily.  Dispense: 180 tablet; Refill: 1 - CBC with Differential/Platelet - Comprehensive metabolic panel  3. Type 2 diabetes mellitus with hyperglycemia, with long-term current use of insulin (HCC) - metFORMIN (GLUCOPHAGE-XR) 500 MG 24 hr tablet; Take 1 tablet (500 mg total) by mouth daily with breakfast.  Dispense: 90 tablet; Refill: 2 - Bayer DCA Hb A1c Waived - CBC with Differential/Platelet - Comprehensive metabolic panel - Microalbumin / creatinine urine ratio  4. Hyperlipidemia, unspecified hyperlipidemia type - rosuvastatin (CRESTOR) 20 MG tablet; Take 1 tablet (20 mg total) by mouth daily.  Dispense: 90 tablet; Refill: 1 - CBC with Differential/Platelet - Comprehensive metabolic panel  5. History of radiation to head and neck region - CBC with Differential/Platelet - Comprehensive metabolic panel  6. Primary osteoarthritis of right knee - CBC with Differential/Platelet - Comprehensive metabolic panel  7. History  of left shoulder replacement - CBC with Differential/Platelet - Comprehensive metabolic panel  8. Morbid obesity (HCC)  - CBC with Differential/Platelet - Comprehensive metabolic panel  9. OSA (obstructive sleep apnea) - CBC with Differential/Platelet - Comprehensive metabolic panel  10. Bilateral impacted cerumen  - CBC with Differential/Platelet - Comprehensive metabolic panel   Labs pending Continue current medications  Health Maintenance reviewed Diet and exercise encouraged  Follow up plan: 6 months    Jannifer Rodney, FNP

## 2022-12-18 NOTE — Patient Instructions (Signed)
Hypertension, Adult High blood pressure (hypertension) is when the force of blood pumping through the arteries is too strong. The arteries are the blood vessels that carry blood from the heart throughout the body. Hypertension forces the heart to work harder to pump blood and may cause arteries to become narrow or stiff. Untreated or uncontrolled hypertension can lead to a heart attack, heart failure, a stroke, kidney disease, and other problems. A blood pressure reading consists of a higher number over a lower number. Ideally, your blood pressure should be below 120/80. The first ("top") number is called the systolic pressure. It is a measure of the pressure in your arteries as your heart beats. The second ("bottom") number is called the diastolic pressure. It is a measure of the pressure in your arteries as the heart relaxes. What are the causes? The exact cause of this condition is not known. There are some conditions that result in high blood pressure. What increases the risk? Certain factors may make you more likely to develop high blood pressure. Some of these risk factors are under your control, including: Smoking. Not getting enough exercise or physical activity. Being overweight. Having too much fat, sugar, calories, or salt (sodium) in your diet. Drinking too much alcohol. Other risk factors include: Having a personal history of heart disease, diabetes, high cholesterol, or kidney disease. Stress. Having a family history of high blood pressure and high cholesterol. Having obstructive sleep apnea. Age. The risk increases with age. What are the signs or symptoms? High blood pressure may not cause symptoms. Very high blood pressure (hypertensive crisis) may cause: Headache. Fast or irregular heartbeats (palpitations). Shortness of breath. Nosebleed. Nausea and vomiting. Vision changes. Severe chest pain, dizziness, and seizures. How is this diagnosed? This condition is diagnosed by  measuring your blood pressure while you are seated, with your arm resting on a flat surface, your legs uncrossed, and your feet flat on the floor. The cuff of the blood pressure monitor will be placed directly against the skin of your upper arm at the level of your heart. Blood pressure should be measured at least twice using the same arm. Certain conditions can cause a difference in blood pressure between your right and left arms. If you have a high blood pressure reading during one visit or you have normal blood pressure with other risk factors, you may be asked to: Return on a different day to have your blood pressure checked again. Monitor your blood pressure at home for 1 week or longer. If you are diagnosed with hypertension, you may have other blood or imaging tests to help your health care provider understand your overall risk for other conditions. How is this treated? This condition is treated by making healthy lifestyle changes, such as eating healthy foods, exercising more, and reducing your alcohol intake. You may be referred for counseling on a healthy diet and physical activity. Your health care provider may prescribe medicine if lifestyle changes are not enough to get your blood pressure under control and if: Your systolic blood pressure is above 130. Your diastolic blood pressure is above 80. Your personal target blood pressure may vary depending on your medical conditions, your age, and other factors. Follow these instructions at home: Eating and drinking  Eat a diet that is high in fiber and potassium, and low in sodium, added sugar, and fat. An example of this eating plan is called the DASH diet. DASH stands for Dietary Approaches to Stop Hypertension. To eat this way: Eat   plenty of fresh fruits and vegetables. Try to fill one half of your plate at each meal with fruits and vegetables. Eat whole grains, such as whole-wheat pasta, brown rice, or whole-grain bread. Fill about one  fourth of your plate with whole grains. Eat or drink low-fat dairy products, such as skim milk or low-fat yogurt. Avoid fatty cuts of meat, processed or cured meats, and poultry with skin. Fill about one fourth of your plate with lean proteins, such as fish, chicken without skin, beans, eggs, or tofu. Avoid pre-made and processed foods. These tend to be higher in sodium, added sugar, and fat. Reduce your daily sodium intake. Many people with hypertension should eat less than 1,500 mg of sodium a day. Do not drink alcohol if: Your health care provider tells you not to drink. You are pregnant, may be pregnant, or are planning to become pregnant. If you drink alcohol: Limit how much you have to: 0-1 drink a day for women. 0-2 drinks a day for men. Know how much alcohol is in your drink. In the [place] one drink equals one 12 oz bottle of beer (355 mL), one 5 oz glass of wine (148 mL), or one 1 oz glass of hard liquor (44 mL). Lifestyle  Work with your health care provider to maintain a healthy body weight or to lose weight. Ask what an ideal weight is for you. Get at least 30 minutes of exercise that causes your heart to beat faster (aerobic exercise) most days of the week. Activities may include walking, swimming, or biking. Include exercise to strengthen your muscles (resistance exercise), such as Pilates or lifting weights, as part of your weekly exercise routine. Try to do these types of exercises for 30 minutes at least 3 days a week. Do not use any products that contain nicotine or tobacco. These products include cigarettes, chewing tobacco, and vaping devices, such as e-cigarettes. If you need help quitting, ask your health care provider. Monitor your blood pressure at home as told by your health care provider. Keep all follow-up visits. This is important. Medicines Take over-the-counter and prescription medicines only as told by your health care provider. Follow directions carefully. Blood  pressure medicines must be taken as prescribed. Do not skip doses of blood pressure medicine. Doing this puts you at risk for problems and can make the medicine less effective. Ask your health care provider about side effects or reactions to medicines that you should watch for. Contact a health care provider if you: Think you are having a reaction to a medicine you are taking. Have headaches that keep coming back (recurring). Feel dizzy. Have swelling in your ankles. Have trouble with your vision. Get help right away if you: Develop a severe headache or confusion. Have unusual weakness or numbness. Feel faint. Have severe pain in your chest or abdomen. Vomit repeatedly. Have trouble breathing. These symptoms may be an emergency. Get help right away. Call 911. Do not wait to see if the symptoms will go away. Do not drive yourself to the hospital. Summary Hypertension is when the force of blood pumping through your arteries is too strong. If this condition is not controlled, it may put you at risk for serious complications. Your personal target blood pressure may vary depending on your medical conditions, your age, and other factors. For most people, a normal blood pressure is less than 120/80. Hypertension is treated with lifestyle changes, medicines, or a combination of both. Lifestyle changes include losing weight, eating a healthy,   low-sodium diet, exercising more, and limiting alcohol. This information is not intended to replace advice given to you by your health care provider. Make sure you discuss any questions you have with your health care provider. Document Revised: 12/10/2020 Document Reviewed: 12/10/2020 Elsevier Patient Education  2024 [employer] Inc[employer]  

## 2022-12-19 LAB — COMPREHENSIVE METABOLIC PANEL
ALT: 23 [IU]/L (ref 0–44)
AST: 22 [IU]/L (ref 0–40)
Albumin: 4.7 g/dL (ref 3.9–4.9)
Alkaline Phosphatase: 62 [IU]/L (ref 44–121)
BUN/Creatinine Ratio: 16 (ref 10–24)
BUN: 14 mg/dL (ref 8–27)
Bilirubin Total: 0.7 mg/dL (ref 0.0–1.2)
CO2: 26 mmol/L (ref 20–29)
Calcium: 9.8 mg/dL (ref 8.6–10.2)
Chloride: 102 mmol/L (ref 96–106)
Creatinine, Ser: 0.87 mg/dL (ref 0.76–1.27)
Globulin, Total: 1.7 g/dL (ref 1.5–4.5)
Glucose: 99 mg/dL (ref 70–99)
Potassium: 4 mmol/L (ref 3.5–5.2)
Sodium: 142 mmol/L (ref 134–144)
Total Protein: 6.4 g/dL (ref 6.0–8.5)
eGFR: 98 mL/min/{1.73_m2} (ref >59)

## 2022-12-19 LAB — CBC WITH DIFFERENTIAL/PLATELET
Basophils Absolute: 0.1 10*3/uL (ref 0.0–0.2)
Basos: 1 %
EOS (ABSOLUTE): 0.2 10*3/uL (ref 0.0–0.4)
Eos: 3 %
Hematocrit: 48.3 % (ref 37.5–51.0)
Hemoglobin: 16.1 g/dL (ref 13.0–17.7)
Immature Grans (Abs): 0 10*3/uL (ref 0.0–0.1)
Immature Granulocytes: 0 %
Lymphocytes Absolute: 1.3 10*3/uL (ref 0.7–3.1)
Lymphs: 20 %
MCH: 29.7 pg (ref 26.6–33.0)
MCHC: 33.3 g/dL (ref 31.5–35.7)
MCV: 89 fL (ref 79–97)
Monocytes Absolute: 0.5 10*3/uL (ref 0.1–0.9)
Monocytes: 8 %
Neutrophils Absolute: 4.3 10*3/uL (ref 1.4–7.0)
Neutrophils: 68 %
Platelets: 269 10*3/uL (ref 150–450)
RBC: 5.43 x10E6/uL (ref 4.14–5.80)
RDW: 13.2 % (ref 11.6–15.4)
WBC: 6.3 10*3/uL (ref 3.4–10.8)

## 2022-12-20 LAB — MICROALBUMIN / CREATININE URINE RATIO
Creatinine, Urine: 105.4 mg/dL
Microalb/Creat Ratio: 21 mg/g{creat} (ref 0–29)
Microalbumin, Urine: 22.2 ug/mL

## 2023-02-25 DIAGNOSIS — L570 Actinic keratosis: Secondary | ICD-10-CM | POA: Diagnosis not present

## 2023-02-25 DIAGNOSIS — L578 Other skin changes due to chronic exposure to nonionizing radiation: Secondary | ICD-10-CM | POA: Diagnosis not present

## 2023-02-25 DIAGNOSIS — D492 Neoplasm of unspecified behavior of bone, soft tissue, and skin: Secondary | ICD-10-CM | POA: Diagnosis not present

## 2023-02-25 DIAGNOSIS — D225 Melanocytic nevi of trunk: Secondary | ICD-10-CM | POA: Diagnosis not present

## 2023-02-25 DIAGNOSIS — L821 Other seborrheic keratosis: Secondary | ICD-10-CM | POA: Diagnosis not present

## 2023-02-25 DIAGNOSIS — L814 Other melanin hyperpigmentation: Secondary | ICD-10-CM | POA: Diagnosis not present

## 2023-02-25 DIAGNOSIS — B079 Viral wart, unspecified: Secondary | ICD-10-CM | POA: Diagnosis not present

## 2023-02-25 DIAGNOSIS — L538 Other specified erythematous conditions: Secondary | ICD-10-CM | POA: Diagnosis not present

## 2023-03-13 ENCOUNTER — Other Ambulatory Visit: Payer: Self-pay | Admitting: Family

## 2023-03-13 DIAGNOSIS — F411 Generalized anxiety disorder: Secondary | ICD-10-CM

## 2023-04-13 DIAGNOSIS — M170 Bilateral primary osteoarthritis of knee: Secondary | ICD-10-CM | POA: Diagnosis not present

## 2023-04-15 ENCOUNTER — Ambulatory Visit (INDEPENDENT_AMBULATORY_CARE_PROVIDER_SITE_OTHER): Payer: BC Managed Care – PPO | Admitting: Family

## 2023-04-15 ENCOUNTER — Encounter: Payer: Self-pay | Admitting: Family

## 2023-04-15 VITALS — BP 130/75 | HR 106 | Temp 98.0°F | Ht 71.0 in | Wt 258.0 lb

## 2023-04-15 DIAGNOSIS — Z0000 Encounter for general adult medical examination without abnormal findings: Secondary | ICD-10-CM

## 2023-04-15 DIAGNOSIS — E785 Hyperlipidemia, unspecified: Secondary | ICD-10-CM | POA: Diagnosis not present

## 2023-04-15 DIAGNOSIS — G4733 Obstructive sleep apnea (adult) (pediatric): Secondary | ICD-10-CM | POA: Diagnosis not present

## 2023-04-15 DIAGNOSIS — C779 Secondary and unspecified malignant neoplasm of lymph node, unspecified: Secondary | ICD-10-CM | POA: Diagnosis not present

## 2023-04-15 DIAGNOSIS — E1169 Type 2 diabetes mellitus with other specified complication: Secondary | ICD-10-CM | POA: Diagnosis not present

## 2023-04-15 DIAGNOSIS — Z794 Long term (current) use of insulin: Secondary | ICD-10-CM

## 2023-04-15 DIAGNOSIS — Z0001 Encounter for general adult medical examination with abnormal findings: Secondary | ICD-10-CM | POA: Diagnosis not present

## 2023-04-15 DIAGNOSIS — I10 Essential (primary) hypertension: Secondary | ICD-10-CM

## 2023-04-15 DIAGNOSIS — M19012 Primary osteoarthritis, left shoulder: Secondary | ICD-10-CM | POA: Diagnosis not present

## 2023-04-15 DIAGNOSIS — F411 Generalized anxiety disorder: Secondary | ICD-10-CM

## 2023-04-15 DIAGNOSIS — M1711 Unilateral primary osteoarthritis, right knee: Secondary | ICD-10-CM | POA: Diagnosis not present

## 2023-04-15 LAB — BAYER DCA HB A1C WAIVED: HB A1C (BAYER DCA - WAIVED): 6.3 % — ABNORMAL HIGH (ref 4.8–5.6)

## 2023-04-15 NOTE — Progress Notes (Signed)
 Subjective:    Patient ID: Gregory Carey, male    DOB: 09-Feb-1961, 62 y.o.   MRN: [id number]  Chief Complaint  Patient presents with   Medical Management of Chronic Issues   Pt presents today for CPE and chronic follow up. He has metastatic squamous neck cancer. He had surgery on 10/23/20 for laryngoscopy and biopsy. He completed radiation. He is followed by Oncologists.  He has OSA and uses CPAP nightly.   He is morbid obese with BMI of 35 with DM and HTN.   Hypertension This is a chronic problem. The current episode started more than 1 year ago. The problem has been waxing and waning since onset. The problem is uncontrolled. Associated symptoms include anxiety. Pertinent negatives include no blurred vision, malaise/fatigue, peripheral edema or shortness of breath. Risk factors for coronary artery disease include dyslipidemia, diabetes mellitus, male gender and sedentary lifestyle. The current treatment provides moderate improvement.  Diabetes He presents for his follow-up diabetic visit. He has type 2 diabetes mellitus. Hypoglycemia symptoms include nervousness/anxiousness. Pertinent negatives for diabetes include no blurred vision and no foot paresthesias. Risk factors for coronary artery disease include diabetes mellitus, dyslipidemia, hypertension, sedentary lifestyle and post-menopausal. He is following a generally healthy diet. His overall blood glucose range is 110-130 mg/dl.  Arthritis Presents for follow-up visit. He complains of pain and stiffness. Affected locations include the right knee, left knee, left shoulder and right shoulder. His pain is at a severity of 6/10 (after sterioid injection).  Hyperlipidemia This is a chronic problem. The current episode started more than 1 year ago. The problem is uncontrolled. Recent lipid tests were reviewed and are high. Exacerbating diseases include obesity. Pertinent negatives include no shortness of breath. Current antihyperlipidemic  treatment includes statins. The current treatment provides moderate improvement of lipids. Risk factors for coronary artery disease include dyslipidemia, hypertension, male sex, a sedentary lifestyle and diabetes mellitus.  Anxiety Presents for follow-up visit. Symptoms include excessive worry and nervous/anxious behavior. Patient reports no shortness of breath. Symptoms occur occasionally. The severity of symptoms is moderate.        Review of Systems  Constitutional:  Negative for malaise/fatigue.  Eyes:  Negative for blurred vision.  Respiratory:  Negative for shortness of breath.   Musculoskeletal:  Positive for stiffness.  Psychiatric/Behavioral:  The patient is nervous/anxious.   All other systems reviewed and are negative.  Family History  Problem Relation Age of Onset   CAD Father    Diabetes Father    Diabetes Paternal Grandmother    Colon cancer Neg Hx    Colon polyps Neg Hx    Social History   Socioeconomic History   Marital status: Married    Spouse name: Not on file   Number of children: Not on file   Years of education: Not on file   Highest education level: Associate degree: occupational, Scientist, product/process development, or vocational program  Occupational History   Not on file  Tobacco Use   Smoking status: Never   Smokeless tobacco: Never  Vaping Use   Vaping status: Never Used  Substance and Sexual Activity   Alcohol use: No   Drug use: No   Sexual activity: Yes  Other Topics Concern   Not on file  Social History Narrative   Not on file   Social Drivers of Health   Financial Resource Strain: Low Risk  (04/13/2023)   Overall Financial Resource Strain (CARDIA)    Difficulty of Paying Living Expenses: Not hard at all  Food Insecurity: No Food Insecurity (04/13/2023)   Hunger Vital Sign    Worried About Running Out of Food in the Last Year: Never true    Ran Out of Food in the Last Year: Never true  Transportation Needs: No Transportation Needs (04/13/2023)   PRAPARE -  Administrator, Civil Service (Medical): No    Lack of Transportation (Non-Medical): No  Physical Activity: Insufficiently Active (04/13/2023)   Exercise Vital Sign    Days of Exercise per Week: 3 days    Minutes of Exercise per Session: 20 min  Stress: No Stress Concern Present (04/13/2023)   [employer] of Occupational Health - Occupational Stress Questionnaire    Feeling of Stress : Not at all  Social Connections: Moderately Integrated (04/13/2023)   Social Connection and Isolation Panel [NHANES]    Frequency of Communication with Friends and Family: More than three times a week    Frequency of Social Gatherings with Friends and Family: Three times a week    Attends Religious Services: More than 4 times per year    Active Member of Clubs or Organizations: No    Attends Banker Meetings: Not on file    Marital Status: Married       Objective:   Physical Exam Vitals reviewed.  Constitutional:      General: He is not in acute distress.    Appearance: He is well-developed. He is obese.  HENT:     Head: Normocephalic.     Right Ear: Tympanic membrane normal.     Left Ear: Tympanic membrane normal.  Eyes:     General:        Right eye: No discharge.        Left eye: No discharge.     Pupils: Pupils are equal, round, and reactive to light.  Neck:     Thyroid: No thyromegaly.  Cardiovascular:     Rate and Rhythm: Normal rate and regular rhythm.     Heart sounds: Normal heart sounds. No murmur heard. Pulmonary:     Effort: Pulmonary effort is normal. No respiratory distress.     Breath sounds: Normal breath sounds. No wheezing.  Abdominal:     General: Bowel sounds are normal. There is no distension.     Palpations: Abdomen is soft.     Tenderness: There is no abdominal tenderness.  Musculoskeletal:        General: No tenderness. Normal range of motion.     Cervical back: Normal range of motion and neck supple.  Skin:    General: Skin is warm  and dry.     Findings: No erythema or rash.  Neurological:     Mental Status: He is alert and oriented to person, place, and time.     Cranial Nerves: No cranial nerve deficit.     Deep Tendon Reflexes: Reflexes are normal and symmetric.  Psychiatric:        Behavior: Behavior normal.        Thought Content: Thought content normal.        Judgment: Judgment normal.     Diabetic Foot Exam - Simple   Simple Foot Form Diabetic Foot exam was performed with the following findings: Yes 04/15/2023  2:11 PM  Visual Inspection See comments: Yes Sensation Testing Intact to touch and monofilament testing bilaterally: Yes Pulse Check Posterior Tibialis and Dorsalis pulse intact bilaterally: Yes Comments Callus formation on ball of bilateral foot, dry peeling skin, tinea present  BP (!) 148/89   Pulse (!) 106   Temp 98 F (36.7 C) (Temporal)   Ht 5\' 11"  (1.803 m)   Wt 258 lb (117 kg)   SpO2 96%   BMI 35.98 kg/m      Assessment & Plan:  Gregory Carey comes in today with chief complaint of Medical Management of Chronic Issues   Diagnosis and orders addressed:  1. Annual physical exam (Primary) - Bayer DCA Hb A1c Waived - CBC with Differential/Platelet - CMP14+EGFR - Lipid panel - PSA, total and free  2. Primary osteoarthritis of right knee - CBC with Differential/Platelet - CMP14+EGFR  3. Osteoarthritis of left shoulder, unspecified osteoarthritis type - CBC with Differential/Platelet - CMP14+EGFR  4. OSA (obstructive sleep apnea) - CBC with Differential/Platelet - CMP14+EGFR  5. Morbid obesity (HCC) - CBC with Differential/Platelet - CMP14+EGFR  6. Metastatic squamous cell carcinoma to lymph node (HCC) - CBC with Differential/Platelet - CMP14+EGFR  7. Hyperlipidemia, unspecified hyperlipidemia type - CBC with Differential/Platelet - CMP14+EGFR - Lipid panel  8. Type 2 diabetes mellitus with other specified complication, with long-term current use of  insulin (HCC) - Bayer DCA Hb A1c Waived - CBC with Differential/Platelet - CMP14+EGFR  9. Essential hypertension - CBC with Differential/Platelet - CMP14+EGFR  10. GAD (generalized anxiety disorder) - CBC with Differential/Platelet - CMP14+EGFR   Labs pending Continue current medications  Health Maintenance reviewed Diet and exercise encouraged  Follow up plan: 6 months    Jannifer Rodney, FNP

## 2023-04-15 NOTE — Patient Instructions (Signed)
 Athlete's Foot Athlete's foot (tinea pedis) is a fungal infection of the skin on your feet. It often occurs on the skin that is between or underneath the toes. It can also occur on the soles of your feet. The infection can spread from person to person (is contagious). It can also spread when a person's bare feet come in contact with the fungus on shower floors or on items such as shoes. What are the causes? This condition is caused by a fungus that grows in warm, moist places. You can get athlete's foot by sharing shoes, shower stalls, towels, and wet floors with someone who is infected. Not washing your feet or changing your socks often enough can also lead to athlete's foot. What increases the risk? This condition is more likely to develop in: Men. People who have a weak body defense system (immune system). People who have diabetes. People who use public showers, such as at a gym. People who wear heavy-duty shoes, such as Youth worker. Seasons with warm, humid weather. What are the signs or symptoms? Symptoms of this condition include: Itchy areas between your toes or on the soles of your feet. White, flaky, or scaly areas between your toes or on the soles of your feet. Very itchy small blisters between your toes or on the soles of your feet. Small cuts in your skin. These cuts can become infected. Thick or discolored toenails. How is this diagnosed? This condition may be diagnosed with a physical exam and a review of your medical history. Your health care provider may also take a skin or toenail sample to examine under a microscope. How is this treated? This condition is treated with antifungal medicines. These may be applied as powders, ointments, or creams. In severe cases, an oral antifungal medicine may be given. Follow these instructions at home: Medicines Apply or take over-the-counter and prescription medicines only as told by your health care provider. Apply your  antifungal medicine as told by your health care provider. Do not stop using the antifungal even if your condition improves. Foot care Do not scratch your feet. Keep your feet dry: Wear cotton or wool socks. Change your socks every day or if they become wet. Wear shoes that allow air to flow, such as sandals or canvas tennis shoes. Wash and dry your feet, including the area between your toes. Also, wash and dry your feet: Every day or as told by your health care provider. After exercising. General instructions Do not let others use towels, shoes, nail clippers, or other personal items that touch your feet. Protect your feet by wearing sandals in wet areas, such as locker rooms and shared showers. Keep all follow-up visits. This is important. If you have diabetes, keep your blood sugar under control. Contact a health care provider if: You have a fever. You have swelling, soreness, warmth, or redness in your foot. Your feet are not getting better with treatment. Your symptoms get worse. You have new symptoms. You have severe pain. Summary Athlete's foot (tinea pedis) is a fungal infection of the skin on your feet. It often occurs on skin that is between or underneath the toes. This condition is caused by a fungus that grows in warm, moist places. Symptoms include white, flaky, or scaly areas between your toes or on the soles of your feet. This condition is treated with antifungal medicines. Keep your feet clean. Always dry them thoroughly. This information is not intended to replace advice given to you by  your health care provider. Make sure you discuss any questions you have with your health care provider. Document Revised: 05/26/2020 Document Reviewed: 05/26/2020 Elsevier Patient Education  2024 [employer].

## 2023-04-16 LAB — CMP14+EGFR
ALT: 26 IU/L (ref 0–44)
AST: 19 IU/L (ref 0–40)
Albumin: 4.8 g/dL (ref 3.9–4.9)
Alkaline Phosphatase: 68 IU/L (ref 44–121)
BUN/Creatinine Ratio: 14 (ref 10–24)
BUN: 14 mg/dL (ref 8–27)
Bilirubin Total: 0.9 mg/dL (ref 0.0–1.2)
CO2: 24 mmol/L (ref 20–29)
Calcium: 9.8 mg/dL (ref 8.6–10.2)
Chloride: 102 mmol/L (ref 96–106)
Creatinine, Ser: 0.97 mg/dL (ref 0.76–1.27)
Globulin, Total: 2 g/dL (ref 1.5–4.5)
Glucose: 101 mg/dL — ABNORMAL HIGH (ref 70–99)
Potassium: 3.9 mmol/L (ref 3.5–5.2)
Sodium: 143 mmol/L (ref 134–144)
Total Protein: 6.8 g/dL (ref 6.0–8.5)
eGFR: 89 mL/min/{1.73_m2} (ref >59)

## 2023-04-16 LAB — CBC WITH DIFFERENTIAL/PLATELET
Basophils Absolute: 0.1 10*3/uL (ref 0.0–0.2)
Basos: 1 %
EOS (ABSOLUTE): 0.1 10*3/uL (ref 0.0–0.4)
Eos: 2 %
Hematocrit: 48.6 % (ref 37.5–51.0)
Hemoglobin: 16.6 g/dL (ref 13.0–17.7)
Immature Grans (Abs): 0 10*3/uL (ref 0.0–0.1)
Immature Granulocytes: 1 %
Lymphocytes Absolute: 1.3 10*3/uL (ref 0.7–3.1)
Lymphs: 15 %
MCH: 29.9 pg (ref 26.6–33.0)
MCHC: 34.2 g/dL (ref 31.5–35.7)
MCV: 87 fL (ref 79–97)
Monocytes Absolute: 0.6 10*3/uL (ref 0.1–0.9)
Monocytes: 7 %
Neutrophils Absolute: 6.6 10*3/uL (ref 1.4–7.0)
Neutrophils: 74 %
Platelets: 304 10*3/uL (ref 150–450)
RBC: 5.56 x10E6/uL (ref 4.14–5.80)
RDW: 13.1 % (ref 11.6–15.4)
WBC: 8.8 10*3/uL (ref 3.4–10.8)

## 2023-04-16 LAB — LIPID PANEL
Chol/HDL Ratio: 4.9 ratio (ref 0.0–5.0)
Cholesterol, Total: 165 mg/dL (ref 100–199)
HDL: 34 mg/dL — ABNORMAL LOW (ref >39)
LDL Chol Calc (NIH): 104 mg/dL — ABNORMAL HIGH (ref 0–99)
Triglycerides: 149 mg/dL (ref 0–149)
VLDL Cholesterol Cal: 27 mg/dL (ref 5–40)

## 2023-04-16 LAB — PSA, TOTAL AND FREE
PSA, Free Pct: 28.3 %
PSA, Free: 0.17 ng/mL
Prostate Specific Ag, Serum: 0.6 ng/mL (ref 0.0–4.0)

## 2023-04-26 ENCOUNTER — Other Ambulatory Visit: Payer: Self-pay | Admitting: Thoracic Surgery (Cardiothoracic Vascular Surgery)

## 2023-04-26 DIAGNOSIS — I7121 Aneurysm of the ascending aorta, without rupture: Secondary | ICD-10-CM

## 2023-05-25 ENCOUNTER — Ambulatory Visit
Admission: RE | Admit: 2023-05-25 | Discharge: 2023-05-25 | Disposition: A | Discharge status: Home / Self Care | Source: Ambulatory Visit | Attending: Thoracic Surgery (Cardiothoracic Vascular Surgery) | Admitting: Thoracic Surgery (Cardiothoracic Vascular Surgery)

## 2023-05-25 DIAGNOSIS — I7121 Aneurysm of the ascending aorta, without rupture: Secondary | ICD-10-CM | POA: Diagnosis not present

## 2023-05-25 MED ORDER — IOPAMIDOL (ISOVUE-370) INJECTION 76%
500.0000 mL | Freq: Once | INTRAVENOUS | Status: AC | PRN
  Administered 2023-05-25: 75 mL via INTRAVENOUS

## 2023-06-01 ENCOUNTER — Ambulatory Visit (INDEPENDENT_AMBULATORY_CARE_PROVIDER_SITE_OTHER): Admitting: Surgical

## 2023-06-01 VITALS — BP 151/77 | HR 59 | Resp 20 | Ht 71.0 in | Wt 260.6 lb

## 2023-06-01 DIAGNOSIS — I7121 Aneurysm of the ascending aorta, without rupture: Secondary | ICD-10-CM | POA: Diagnosis not present

## 2023-06-01 DIAGNOSIS — I712 Thoracic aortic aneurysm, without rupture, unspecified: Secondary | ICD-10-CM | POA: Insufficient documentation

## 2023-06-01 NOTE — Patient Instructions (Signed)
 Discussed routine preventative measures in terms of lifestyle medicine Discussed importance of good control of blood pressure

## 2023-06-01 NOTE — Progress Notes (Signed)
 [address]       [place] [address]             [phone]        Gregory Carey [id number] [birth year]  History of Present Illness: Patient is a 63 year old male we are continuing to follow and ongoing surveillance of his ascending thoracic aortic aneurysm.  Last year it measured 4.3 cm and in most recent scan as described below it is very stable in appearance.  He has not had any recent new symptoms and specifically denies chest pain.  He has a history of head neck cancer but reports that he is doing well in this regard.  He works at the [employer] as he describes his job is extremely stressful.  He is on medication for hypertension and also has a history of hyperlipidemia and diabetes.  He reports that typically his blood pressure is under good control and he does not check it fairly frequently although not daily.  Blood pressure is noted to be elevated today.    Past Medical History:  Diagnosis Date   Cancer (HCC)    neck   Complication of anesthesia    Diabetes mellitus without complication (HCC)    Difficult intubation    Was infomed by MD Anesth his airway was small   Hyperlipidemia    Hypertension    Localized osteoarthritis of left shoulder    Osteoarthritis of left shoulder 09/29/2018   Sleep apnea    uses CPAP    Past Surgical History:  Procedure Laterality Date   BIOPSY  04/23/2020   Procedure: BIOPSY;  Surgeon: Lanelle Bal, DO;  Location: [facility];  Service: Endoscopy;;   COLONOSCOPY WITH PROPOFOL N/A 04/23/2020   Procedure: COLONOSCOPY WITH PROPOFOL;  Surgeon: Lanelle Bal, DO;  Location: [facility];  Service: Endoscopy;  Laterality: N/A;  am appt   DIRECT LARYNGOSCOPY N/A 10/23/2020   Procedure: DIRECT LARYNGOSCOPY WITH BIOPSIES;  Surgeon: Serena Colonel, MD;  Location[facility] OR;  Service: ENT;  Laterality: N/A;   ESOPHAGOSCOPY N/A 10/23/2020   Procedure: ESOPHAGOSCOPY;  Surgeon: Serena Colonel, MD;  Location:  [facility] OR;  Service: ENT;  Laterality: N/A;   KNEE ARTHROSCOPY Right 02/16/2006   POLYPECTOMY  04/23/2020   Procedure: POLYPECTOMY;  Surgeon: Lanelle Bal, DO;  Location: [facility];  Service: Endoscopy;;   RADICAL NECK DISSECTION Left 10/23/2020   Procedure: LEFT NECK DISSECTION;  Surgeon: Serena Colonel, MD;  Location: [facility] OR;  Service: ENT;  Laterality: Left;   SHOULDER ARTHROSCOPY Right 02/17/2000   SKIN CANCER EXCISION     Multiple basal cell and squmous cell leisons removed   SKIN SURGERY Left 05/05/2022   TOTAL SHOULDER ARTHROPLASTY Left 09/29/2018   Procedure: TOTAL SHOULDER ARTHROPLASTY;  Surgeon: Yolonda Kida, MD;  Location: [facility] OR;  Service: Orthopedics;  Laterality: Left;  2.5 hrs     Social History   Occupational History   Not on file  Tobacco Use   Smoking status: Never   Smokeless tobacco: Never  Vaping Use   Vaping status: Never Used  Substance and Sexual Activity   Alcohol use: No   Drug use: No   Sexual activity: Yes     No Known Allergies    Current Outpatient Medications on File Prior to Visit  Medication Sig Dispense Refill   acetaminophen (TYLENOL) 325 MG tablet Take 650 mg by mouth every 6 (six) hours as needed.  blood glucose meter kit and supplies Dispense based on patient and insurance preference. Use up to four times daily as directed. (FOR ICD-9 250.00, 250.01). 1 each 0   busPIRone (BUSPAR) 5 MG tablet Take 1 tablet (5 mg total) by mouth 3 (three) times daily. 90 tablet 1   CONTOUR NEXT TEST test strip USE AS DIRECTED TWICE DAILY 100 each 0   lisinopril-hydrochlorothiazide (ZESTORETIC) 20-12.5 MG tablet Take 2 tablets by mouth daily. 180 tablet 1   metFORMIN (GLUCOPHAGE-XR) 500 MG 24 hr tablet Take 1 tablet (500 mg total) by mouth daily with breakfast. 90 tablet 2   rosuvastatin (CRESTOR) 20 MG tablet Take 1 tablet (20 mg total) by mouth daily. 90 tablet 1   sodium fluoride (SODIUM FLUORIDE 5000 PPM) 1.1 % GEL dental gel Place 1  pea-size drop into each tooth space of fluoride trays once a day at bedtime.  Leave trays in for 5 minutes and then remove.  Spit out excess fluoride, but DO NOT rinse with water, eat or drink for at least 30 minutes after use. 120 mL 11   No current facility-administered medications on file prior to visit.     BP (!) 151/77 (BP Location: Left Arm, Patient Position: Sitting, Cuff Size: Normal)   Pulse (!) 59   Resp 20   Ht 5\' 11"  (1.803 m)   Wt 260 lb 9.6 oz (118.2 kg)   SpO2 95% Comment: RA  BMI 36.35 kg/m   Physical Exam Constitutional:      General: He is not in acute distress.    Appearance: Normal appearance. He is not ill-appearing.  HENT:     Head: Normocephalic and atraumatic.  Eyes:     General:        Right eye: No discharge.     Extraocular Movements: Extraocular movements intact.     Pupils: Pupils are equal, round, and reactive to light.  Neck:     Vascular: No carotid bruit.  Cardiovascular:     Rate and Rhythm: Normal rate and regular rhythm.  Pulmonary:     Effort: Pulmonary effort is normal.  Abdominal:     Palpations: Abdomen is soft.     Tenderness: There is no abdominal tenderness.  Musculoskeletal:        General: No swelling.     Right lower leg: No edema.     Left lower leg: No edema.  Skin:    Coloration: Skin is not jaundiced or pale.  Neurological:     Mental Status: He is alert.  Psychiatric:        Thought Content: Thought content normal.     CTA Results:  Narrative & Impression  CLINICAL DATA:  Thoracic aortic aneurysm, throat carcinoma   EXAM: CT ANGIOGRAPHY CHEST WITH CONTRAST   TECHNIQUE: Multidetector CT imaging of the chest was performed using the standard protocol during bolus administration of intravenous contrast. Multiplanar CT image reconstructions and MIPs were obtained to evaluate the vascular anatomy.   RADIATION DOSE REDUCTION: This exam was performed according to the departmental dose-optimization program which  includes automated exposure control, adjustment of the mA and/or kV according to patient size and/or use of iterative reconstruction technique.   CONTRAST:  75mL ISOVUE-370 IOPAMIDOL (ISOVUE-370) INJECTION 76%   COMPARISON:  CT 05/01/2022   FINDINGS: Cardiovascular: Left brachiocephalic vein drains into the coronary sinus, anatomic variant. Heart size normal. Satisfactory opacification of pulmonary arteries noted, and there is no evidence of pulmonary emboli. Scattered mild 3-vessel coronary calcifications. Good  contrast opacification of the thoracic aorta, without dissection or stenosis. Patent 3-vessel brachiocephalic arterial origin anatomy.   Aortic Root:   --Valve: 2.5 cm   --Sinuses: 4.2 cm   --Sinotubular Junction: 3.6 cm   Limitations by motion: Mild   Thoracic Aorta:   --Ascending Aorta: 4.3 cm (previously 4.3)   --Aortic Arch: 3.4 cm   --Descending Aorta: 3.1 cm   Mediastinum/Nodes: No mass, hematoma, or adenopathy.   Lungs/Pleura: No pleural effusion. No pneumothorax. Calcified subcentimeter granulomas in the right lower lobe. No new nodule or infiltrate.   Upper Abdomen: No acute findings.   Musculoskeletal: Vertebral endplate spurring at multiple levels in the mid and lower thoracic spine. Right shoulder DJD.   Review of the MIP images confirms the above findings.   IMPRESSION: 1. Stable 4.3 cm ascending thoracic aortic aneurysm. Recommend annual imaging followup by CTA or MRA. This recommendation follows 2010 ACCF/AHA/AATS/ACR/ASA/SCA/SCAI/SIR/STS/SVM Guidelines for the Diagnosis and Management of Patients with Thoracic Aortic Disease. Circulation. 2010; 121: G295-M841 2. No acute findings.     Electronically Signed   By: Nicoletta Barrier M.D.   On: 05/25/2023 16:14      A/P: The aneurysm remains stable.  We reviewed routine instructions and recommendations regarding thoracic aortic aneurysms.  He has a good understanding since this has been  followed for a few years now.  We will see the patient again in 1 year with a repeat chest CTA for ongoing surveillance.      Risk Modification: Reduce stress as able.  Statin: He is on Crestor moking cessation instruction/counseling given: He is a non-smoker  Patient was counseled on importance of Blood Pressure Control.  Despite Medical intervention if the patient notices persistently elevated blood pressure readings.  They are instructed to contact their Primary Care Physician  Please avoid use of Fluoroquinolones as this can potentially increase your risk of Aortic Rupture and/or Dissection  Patient educated on signs and symptoms of Aortic Dissection, handout also provided in AVS  Lindi Revering, PA-C 06/01/23

## 2023-06-09 DIAGNOSIS — M170 Bilateral primary osteoarthritis of knee: Secondary | ICD-10-CM | POA: Diagnosis not present

## 2023-06-10 ENCOUNTER — Telehealth: Payer: Self-pay | Admitting: *Deleted

## 2023-06-10 ENCOUNTER — Other Ambulatory Visit: Payer: Self-pay | Admitting: Family

## 2023-06-10 DIAGNOSIS — I10 Essential (primary) hypertension: Secondary | ICD-10-CM

## 2023-06-10 DIAGNOSIS — E785 Hyperlipidemia, unspecified: Secondary | ICD-10-CM

## 2023-06-10 NOTE — Telephone Encounter (Signed)
 PT NEVER SEEN HERE BEFORE.  DID SEND [facility] LETTING THEM KNOW THAT WE NEED A NEW PT REFERRAL FOR PT SO WE CAN GET SCHEDULED FOR SURGICAL CLEARANCE.       Pre-operative Risk Assessment    Patient Name: Gregory Carey  DOB: Feb 17, 1960 MRN: [id number]   Date of last office visit: WILL BE A NEW PT, WAITING ON NEW PT REFERRAL Date of next office visit:    Request for Surgical Clearance    Procedure:   LEFT TOTAL KNEE ARTHROPLASTY  Date of Surgery:  Clearance 08/16/23                                Surgeon:  DR. MATTHEW OLIN Surgeon's Group or Practice Name:  [facility] Phone number:  [phone] Fax number:  [phone]   Type of Clearance Requested:   - Medical    Type of Anesthesia:  Not Indicated   Additional requests/questions:    [name]   06/10/2023, 11:52 AM

## 2023-06-16 NOTE — Telephone Encounter (Signed)
 Looks like preop RMA addressed back to surgeon's the pt will need a new pt appt. We will need a new pt referral sent to our office for preop clearance. I will reach out to our scheduling team to see if they may be able to reach out the pt with a new pt appt.   I will resend notes to surgeon's office about new pt appt and referral.

## 2023-06-23 NOTE — Telephone Encounter (Signed)
 Left message to call back to schedule in office new pt appt for preop clearance.

## 2023-06-25 NOTE — Telephone Encounter (Signed)
  Pt returned call and made an appt with Dr. Tita Form on 06/28/23 at 9:40 am

## 2023-06-25 NOTE — Telephone Encounter (Signed)
 Will update all parties involved pt has new pt appt 06/28/23 with Dr. Alvis Ba for preop clearance.

## 2023-06-27 NOTE — Progress Notes (Unsigned)
 Cardiology Office Note:    Date:  06/28/2023   ID:  Gregory Carey, DOB [birth year], MRN [id number]  PCP:  Yevette Hem, FNP   [facility] Providers Cardiologist:  None     Referring MD: Yevette Hem, FNP   No chief complaint on file. Gregory Carey is a 63 y.o. male who is being seen today for the evaluation of preoperative cardiovascular risk at the request of M. Bernard Brick, MD   History of Present Illness:    Gregory Carey is a 63 y.o. male with a hx of degenerative joint disease of both knees, planning right total knee replacement Dr. Bernard Brick on 08/16/2023, who also has a history of mild ascending aortic aneurysm (4.3 cm by recent CT, type 2 diabetes mellitus, dyslipidemia, hypertension, OSA, severe obesity, left shoulder replacement, history of head and neck cancer requiring surgery and XRT 2022.  He has good functional status.  [place] and has no difficulty climbing stairs or performing other physical exertion, except as limited by his knee pain.  This "slows him down" but does not prevent activity.  He denies any issues with shortness of breath or chest pain.  He does not have intermittent claudication, lower extremity edema.  He has not had palpitations or syncope.  He has lost a lot of weight, believes his sleep apnea has resolved but still uses CPAP.  He has never had DVT or PE.  He has never smoked cigarettes.  His father had cardiac problems (possible bypass surgery) in his early-mid 55s.  His blood pressure is well-controlled.  Most recent hemoglobin A1c was 6.3%.  Most recent lipid profile shows LDL 104 and HDL 34, with normal triglycerides.  He has normal renal function.  Reviewed his recent chest CTA for aortic aneurysm.  There is scanty atherosclerotic calcified plaque seen in the diagonal branch and the right coronary artery as well as in the aortic arch.  Past Medical History:  Diagnosis Date   Cancer (HCC)    neck    Complication of anesthesia    Diabetes mellitus without complication (HCC)    Difficult intubation    Was infomed by MD Anesth his airway was small   Hyperlipidemia    Hypertension    Localized osteoarthritis of left shoulder    Osteoarthritis of left shoulder 09/29/2018   Sleep apnea    uses CPAP    Past Surgical History:  Procedure Laterality Date   BIOPSY  04/23/2020   Procedure: BIOPSY;  Surgeon: Vinetta Greening, DO;  [facility];  Service: Endoscopy;;   COLONOSCOPY WITH PROPOFOL  N/A 04/23/2020   Procedure: COLONOSCOPY WITH PROPOFOL ;  Surgeon: Vinetta Greening, DO;  [facility];  Service: Endoscopy;  Laterality: N/A;  am appt   DIRECT LARYNGOSCOPY N/A 10/23/2020   Procedure: DIRECT LARYNGOSCOPY WITH BIOPSIES;  Surgeon: Janita Mellow, MD;  Location: [facility] OR;  Service: ENT;  Laterality: N/A;   ESOPHAGOSCOPY N/A 10/23/2020   Procedure: ESOPHAGOSCOPY;  Surgeon: Janita Mellow, MD;  Location: [facility] OR;  Service: ENT;  Laterality: N/A;   KNEE ARTHROSCOPY Right 02/16/2006   POLYPECTOMY  04/23/2020   Procedure: POLYPECTOMY;  Surgeon: Vinetta Greening, DO;  [facility];  Service: Endoscopy;;   RADICAL NECK DISSECTION Left 10/23/2020   Procedure: LEFT NECK DISSECTION;  Surgeon: Janita Mellow, MD;  Location: [facility] OR;  Service: ENT;  Laterality: Left;   SHOULDER ARTHROSCOPY Right 02/17/2000  SKIN CANCER EXCISION     Multiple basal cell and squmous cell leisons removed   SKIN SURGERY Left 05/05/2022   TOTAL SHOULDER ARTHROPLASTY Left 09/29/2018   Procedure: TOTAL SHOULDER ARTHROPLASTY;  Surgeon: Janeth Medicus, MD;  Location: [facility] OR;  Service: Orthopedics;  Laterality: Left;  2.5 hrs    Current Medications: Current Meds  Medication Sig   blood glucose meter kit and supplies Dispense based on patient and insurance preference. Use up to four times daily as directed. (FOR ICD-9 250.00, 250.01).   busPIRone  (BUSPAR ) 5 MG tablet Take 1 tablet (5 mg total)  by mouth 3 (three) times daily.   CONTOUR NEXT TEST test strip USE AS DIRECTED TWICE DAILY   diclofenac (VOLTAREN) 75 MG EC tablet Take 75 mg by mouth 2 (two) times daily.   lisinopril -hydrochlorothiazide  (ZESTORETIC ) 20-12.5 MG tablet Take 2 tablets by mouth once daily   metFORMIN  (GLUCOPHAGE -XR) 750 MG 24 hr tablet Take 750 mg by mouth daily with breakfast.   rosuvastatin  (CRESTOR ) 20 MG tablet Take 1 tablet by mouth once daily     Allergies:   Patient has no known allergies.   Social History   Socioeconomic History   Marital status: Married    Spouse name: Not on file   Number of children: Not on file   Years of education: Not on file   Highest education level: Associate degree: occupational, Scientist, product/process development, or vocational program  Occupational History   Not on file  Tobacco Use   Smoking status: Never   Smokeless tobacco: Never  Vaping Use   Vaping status: Never Used  Substance and Sexual Activity   Alcohol use: No   Drug use: No   Sexual activity: Yes  Other Topics Concern   Not on file  Social History Narrative   Not on file   Social Drivers of Health   Financial Resource Strain: Low Risk  (04/13/2023)   Overall Financial Resource Strain (CARDIA)    Difficulty of Paying Living Expenses: Not hard at all  Food Insecurity: No Food Insecurity (04/13/2023)   Hunger Vital Sign    Worried About Running Out of Food in the Last Year: Never true    Ran Out of Food in the Last Year: Never true  Transportation Needs: No Transportation Needs (04/13/2023)   PRAPARE - Administrator, Civil Service (Medical): No    Lack of Transportation (Non-Medical): No  Physical Activity: Insufficiently Active (04/13/2023)   Exercise Vital Sign    Days of Exercise per Week: 3 days    Minutes of Exercise per Session: 20 min  Stress: No Stress Concern Present (04/13/2023)   [employer] of Occupational Health - Occupational Stress Questionnaire    Feeling of Stress : Not at all   Social Connections: Moderately Integrated (04/13/2023)   Social Connection and Isolation Panel [NHANES]    Frequency of Communication with Friends and Family: More than three times a week    Frequency of Social Gatherings with Friends and Family: Three times a week    Attends Religious Services: More than 4 times per year    Active Member of Clubs or Organizations: No    Attends Engineer, structural: Not on file    Marital Status: Married     Family History: The patient's family history includes CAD in his father; Diabetes in his father and paternal grandmother. There is no history of Colon cancer or Colon polyps.  ROS:   Please see the history of  present illness.     All other systems reviewed and are negative.  EKGs/Labs/Other Studies Reviewed:    The following studies were reviewed today:  EKG Interpretation Date/Time:  Monday Jun 28 2023 09:29:46 EDT Ventricular Rate:  63 PR Interval:  158 QRS Duration:  96 QT Interval:  410 QTC Calculation: 419 R Axis:   -8  Text Interpretation: Normal sinus rhythm Normal ECG When compared with ECG of 19-Apr-2020 13:50, Nonspecific T wave abnormality has replaced inverted T waves in Inferior leads QT has shortened Confirmed by Omega Durante ([id number]) on 06/28/2023 10:02:09 AM        EKG Interpretation Date/Time:  Monday Jun 28 2023 09:29:46 EDT Ventricular Rate:  63 PR Interval:  158 QRS Duration:  96 QT Interval:  410 QTC Calculation: 419 R Axis:   -8  Text Interpretation: Normal sinus rhythm Normal ECG When compared with ECG of 19-Apr-2020 13:50, Nonspecific T wave abnormality has replaced inverted T waves in Inferior leads QT has shortened Confirmed by Rilyn Scroggs ([id number]) on 06/28/2023 10:02:09 AM    Recent Labs: 04/15/2023: ALT 26; BUN 14; Creatinine, Ser 0.97; Hemoglobin 16.6; Platelets 304; Potassium 3.9; Sodium 143  Recent Lipid Panel    Component Value Date/Time   CHOL 165 04/15/2023 1419   TRIG 149  04/15/2023 1419   HDL 34 (L) 04/15/2023 1419   CHOLHDL 4.9 04/15/2023 1419   LDLCALC 104 (H) 04/15/2023 1419     Risk Assessment/Calculations:                Physical Exam:    VS:  BP 127/76 (BP Location: Left Arm, Patient Position: Sitting, Cuff Size: Large)   Pulse 63   Ht 5\' 11"  (1.803 m)   Wt 119.5 kg   SpO2 94%   BMI 36.74 kg/m     Wt Readings from Last 3 Encounters:  06/28/23 119.5 kg  06/01/23 118.2 kg  04/15/23 117 kg     GEN: Severely obese, but also appears fit and muscular, well nourished, well developed in no acute distress HEENT: Normal NECK: No JVD; No carotid bruits LYMPHATICS: No lymphadenopathy CARDIAC: RRR, no murmurs, rubs, gallops RESPIRATORY:  Clear to auscultation without rales, wheezing or rhonchi  ABDOMEN: Soft, non-tender, non-distended MUSCULOSKELETAL:  No edema; No deformity  SKIN: Warm and dry NEUROLOGIC:  Alert and oriented x 3 PSYCHIATRIC:  Normal affect   ASSESSMENT:    1. Preoperative clearance   2. Aneurysm of ascending aorta without rupture (HCC)   3. Type 2 diabetes mellitus with other specified complication, without long-term current use of insulin  (HCC)   4. Hypercholesterolemia   5. Essential hypertension    PLAN:    In order of problems listed above:  Low risk of perioperative complications with planned knee replacement surgery.  Discussed the risk of venous thromboembolic events and the need to mobilize very quickly. Ascending aortic aneurysm: Followed by CV surgery.  Recently measured at 4.3 cm.  Follow-up plan is in place. HTN: Well-controlled.  Continue current lisinopril -hydrochlorothiazide . DM: Well-controlled on low-dose metformin .  Continue. HLP: Disadvantageous combination of low HDL and mildly elevated LDL, borderline triglycerides.  Probably has small dense LDL cholesterol.  May benefit from more aggressive lipid-lowering.  Recommend a coronary calcium  score.  If his calcium  score is elevated would set a  target LDL less than 70 (to achieve that he would probably need to add Zetia and increase the dose of rosuvastatin ).  If the calcium  score is low or average, set a target LDL  less than 100.           Medication Adjustments/Labs and Tests Ordered: Current medicines are reviewed at length with the patient today.  Concerns regarding medicines are outlined above.  Orders Placed This Encounter  Procedures   CT CARDIAC SCORING (SELF PAY ONLY)   EKG 12-Lead   No orders of the defined types were placed in this encounter.   Patient Instructions  Medication Instructions:  No changes *If you need a refill on your cardiac medications before your next appointment, please call your pharmacy*  Testing/Procedures: Dr Alvis Ba has ordered a CT coronary calcium  score.   Test locations:  [facility]  [facility]  This is $99 out of pocket.   Coronary CalciumScan A coronary calcium  scan is an imaging test used to look for deposits of calcium  and other fatty materials (plaques) in the inner lining of the blood vessels of the heart (coronary arteries). These deposits of calcium  and plaques can partly clog and narrow the coronary arteries without producing any symptoms or warning signs. This puts a person at risk for a heart attack. This test can detect these deposits before symptoms develop. Tell a health care provider about: Any allergies you have. All medicines you are taking, including vitamins, herbs, eye drops, creams, and over-the-counter medicines. Any problems you or family members have had with anesthetic medicines. Any blood disorders you have. Any surgeries you have had. Any medical conditions you have. Whether you are pregnant or may be pregnant. What are the risks? Generally, this is a safe procedure. However, problems may occur, including: Harm to a pregnant woman and her unborn baby. This  test involves the use of radiation. Radiation exposure can be dangerous to a pregnant woman and her unborn baby. If you are pregnant, you generally should not have this procedure done. Slight increase in the risk of cancer. This is because of the radiation involved in the test. What happens before the procedure? No preparation is needed for this procedure. What happens during the procedure? You will undress and remove any jewelry around your neck or chest. You will put on a hospital gown. Sticky electrodes will be placed on your chest. The electrodes will be connected to an electrocardiogram (ECG) machine to record a tracing of the electrical activity of your heart. A CT scanner will take pictures of your heart. During this time, you will be asked to lie still and hold your breath for 2-3 seconds while a picture of your heart is being taken. The procedure may vary among health care providers and hospitals. What happens after the procedure? You can get dressed. You can return to your normal activities. It is up to you to get the results of your test. Ask your health care provider, or the department that is doing the test, when your results will be ready. Summary A coronary calcium  scan is an imaging test used to look for deposits of calcium  and other fatty materials (plaques) in the inner lining of the blood vessels of the heart (coronary arteries). Generally, this is a safe procedure. Tell your health care provider if you are pregnant or may be pregnant. No preparation is needed for this procedure. A CT scanner will take pictures of your heart. You can return to your normal activities after the scan is done. This information is not intended to replace advice given to you by your health care provider. Make sure you  discuss any questions you have with your health care provider. Document Released: 08/01/2007 Document Revised: 12/23/2015 Document Reviewed: 12/23/2015 Elsevier Interactive Patient  Education  2017 [employer].   Follow-Up: At [facility], you and your health needs are our priority.  As part of our continuing mission to provide you with exceptional heart care, our providers are all part of one team.  This team includes your primary Cardiologist (physician) and Advanced Practice Providers or APPs (Physician Assistants and Nurse Practitioners) who all work together to provide you with the care you need, when you need it.  Your next appointment:    Follow up as needed  Provider:   Dr Alvis Ba  We recommend signing up for the patient portal called "MyChart".  Sign up information is provided on this After Visit Summary.  MyChart is used to connect with patients for Virtual Visits (Telemedicine).  Patients are able to view lab/test results, encounter notes, upcoming appointments, etc.  Non-urgent messages can be sent to your provider as well.   To learn more about what you can do with MyChart, go to [email or web address].         Signed, Luana Rumple, MD  06/28/2023 10:18 AM    [facility]

## 2023-06-28 ENCOUNTER — Encounter: Payer: Self-pay | Admitting: Cardiovascular Disease

## 2023-06-28 ENCOUNTER — Ambulatory Visit: Attending: Cardiovascular Disease | Admitting: Cardiovascular Disease

## 2023-06-28 VITALS — BP 127/76 | HR 63 | Ht 71.0 in | Wt 263.4 lb

## 2023-06-28 DIAGNOSIS — Z01818 Encounter for other preprocedural examination: Secondary | ICD-10-CM

## 2023-06-28 DIAGNOSIS — I10 Essential (primary) hypertension: Secondary | ICD-10-CM

## 2023-06-28 DIAGNOSIS — E1169 Type 2 diabetes mellitus with other specified complication: Secondary | ICD-10-CM | POA: Diagnosis not present

## 2023-06-28 DIAGNOSIS — E7800 Pure hypercholesterolemia, unspecified: Secondary | ICD-10-CM

## 2023-06-28 DIAGNOSIS — I7121 Aneurysm of the ascending aorta, without rupture: Secondary | ICD-10-CM

## 2023-06-28 NOTE — Patient Instructions (Signed)
 Medication Instructions:  No changes *If you need a refill on your cardiac medications before your next appointment, please call your pharmacy*  Testing/Procedures: Dr Alvis Ba has ordered a CT coronary calcium  score.   Test locations:  [facility]  [facility]  This is $99 out of pocket.   Coronary CalciumScan A coronary calcium  scan is an imaging test used to look for deposits of calcium  and other fatty materials (plaques) in the inner lining of the blood vessels of the heart (coronary arteries). These deposits of calcium  and plaques can partly clog and narrow the coronary arteries without producing any symptoms or warning signs. This puts a person at risk for a heart attack. This test can detect these deposits before symptoms develop. Tell a health care provider about: Any allergies you have. All medicines you are taking, including vitamins, herbs, eye drops, creams, and over-the-counter medicines. Any problems you or family members have had with anesthetic medicines. Any blood disorders you have. Any surgeries you have had. Any medical conditions you have. Whether you are pregnant or may be pregnant. What are the risks? Generally, this is a safe procedure. However, problems may occur, including: Harm to a pregnant woman and her unborn baby. This test involves the use of radiation. Radiation exposure can be dangerous to a pregnant woman and her unborn baby. If you are pregnant, you generally should not have this procedure done. Slight increase in the risk of cancer. This is because of the radiation involved in the test. What happens before the procedure? No preparation is needed for this procedure. What happens during the procedure? You will undress and remove any jewelry around your neck or chest. You will put on a hospital gown. Sticky electrodes will be placed on your chest. The  electrodes will be connected to an electrocardiogram (ECG) machine to record a tracing of the electrical activity of your heart. A CT scanner will take pictures of your heart. During this time, you will be asked to lie still and hold your breath for 2-3 seconds while a picture of your heart is being taken. The procedure may vary among health care providers and hospitals. What happens after the procedure? You can get dressed. You can return to your normal activities. It is up to you to get the results of your test. Ask your health care provider, or the department that is doing the test, when your results will be ready. Summary A coronary calcium  scan is an imaging test used to look for deposits of calcium  and other fatty materials (plaques) in the inner lining of the blood vessels of the heart (coronary arteries). Generally, this is a safe procedure. Tell your health care provider if you are pregnant or may be pregnant. No preparation is needed for this procedure. A CT scanner will take pictures of your heart. You can return to your normal activities after the scan is done. This information is not intended to replace advice given to you by your health care provider. Make sure you discuss any questions you have with your health care provider. Document Released: 08/01/2007 Document Revised: 12/23/2015 Document Reviewed: 12/23/2015 Elsevier Interactive Patient Education  2017 [employer].   Follow-Up: At [facility], you and your health needs are our priority.  As part of our continuing mission to provide you with exceptional heart care, our providers are all part of one team.  This team includes your primary Cardiologist (physician)  and Advanced Practice Providers or APPs (Physician Assistants and Nurse Practitioners) who all work together to provide you with the care you need, when you need it.  Your next appointment:    Follow up as needed  Provider:   Dr Alvis Ba  We recommend  signing up for the patient portal called "MyChart".  Sign up information is provided on this After Visit Summary.  MyChart is used to connect with patients for Virtual Visits (Telemedicine).  Patients are able to view lab/test results, encounter notes, upcoming appointments, etc.  Non-urgent messages can be sent to your provider as well.   To learn more about what you can do with MyChart, go to [email or web address].

## 2023-06-28 NOTE — Telephone Encounter (Signed)
 Preop clearance letter issued today.

## 2023-06-29 NOTE — Telephone Encounter (Signed)
 Letter sent to requesting office by Dr. Alvis Ba.   Will remove from pre-op pool.   Lonell Rives. Lukka Black, DNP, NP-C  06/29/2023, 8:32 AM [facility] [address] Office [phone] Fax [phone]

## 2023-06-29 NOTE — Telephone Encounter (Signed)
 Will add Dr. Leola Raisin clearance letter from yesterday to the clearance request.        06/28/2023  Re:  Kennon Peaks DOB:  [birth year] MRN:  [id number]  Address: [address]   [place] [address]  Dear Dr. Bernard Brick,  Kennon Peaks is at low risk, from a cardiac standpoint, for the upcoming procedure.    Please call [phone] with any additional questions.  Sincerely,  Luana Rumple, MD, [facility] [phone] office [phone] pager

## 2023-06-29 NOTE — Telephone Encounter (Signed)
 I will update the requesting office to see notes from  preop APP Morey Ar, NP that Dr. Alvis Ba saw the pt yesterday and faxed over clearance letter.

## 2023-07-06 ENCOUNTER — Encounter: Payer: Self-pay | Admitting: Family

## 2023-07-06 ENCOUNTER — Ambulatory Visit (INDEPENDENT_AMBULATORY_CARE_PROVIDER_SITE_OTHER)

## 2023-07-06 ENCOUNTER — Ambulatory Visit (INDEPENDENT_AMBULATORY_CARE_PROVIDER_SITE_OTHER): Admitting: Family

## 2023-07-06 VITALS — BP 131/78 | HR 66 | Temp 97.2°F | Ht 71.0 in | Wt 261.4 lb

## 2023-07-06 DIAGNOSIS — I10 Essential (primary) hypertension: Secondary | ICD-10-CM | POA: Diagnosis not present

## 2023-07-06 DIAGNOSIS — Z01818 Encounter for other preprocedural examination: Secondary | ICD-10-CM

## 2023-07-06 DIAGNOSIS — E785 Hyperlipidemia, unspecified: Secondary | ICD-10-CM

## 2023-07-06 DIAGNOSIS — E1169 Type 2 diabetes mellitus with other specified complication: Secondary | ICD-10-CM

## 2023-07-06 DIAGNOSIS — Z794 Long term (current) use of insulin: Secondary | ICD-10-CM | POA: Diagnosis not present

## 2023-07-06 DIAGNOSIS — Z96651 Presence of right artificial knee joint: Secondary | ICD-10-CM | POA: Diagnosis not present

## 2023-07-06 DIAGNOSIS — I771 Stricture of artery: Secondary | ICD-10-CM | POA: Diagnosis not present

## 2023-07-06 DIAGNOSIS — M1711 Unilateral primary osteoarthritis, right knee: Secondary | ICD-10-CM

## 2023-07-06 LAB — BAYER DCA HB A1C WAIVED: HB A1C (BAYER DCA - WAIVED): 6.5 % — ABNORMAL HIGH (ref 4.8–5.6)

## 2023-07-06 NOTE — Patient Instructions (Signed)
Preparing for Knee Replacement Preparing for your knee replacement surgery can make your recovery easier and more comfortable. Talk with your health care provider so you can learn what will happen before, during, and after surgery. Ask questions if there's something you don't understand. Tell a health care provider about: Any allergies you have. All medicines you're taking, including vitamins, herbs, eye drops, creams, and over-the-counter medicines. Any problems you or family members have had with anesthesia. Any bleeding problems you have. Any surgeries you've had. Any medical conditions you have. Whether you're pregnant or may be pregnant. What happens before the procedure? Visit your providers You'll need to have a physical exam. When you go to the exam, bring a list of all the medicines and supplements you take. This includes herbs and vitamins. You may need more tests to check if it's safe for you to have surgery. Visit your dentist. Get your teeth cleaned and checked before the surgery. Germs from your mouth or an open wound can travel to your new joint and infect it. Tell your dentist that you plan to have a knee replacement. Keep all appointments. Know the costs of surgery To find out about the costs, call your insurance provider as soon as you decide to have surgery. Ask questions like: How much of the surgery and hospital stay will be covered? What will be covered for: Medical device? Physical therapy? Care at home? Prepare your home Pick a recovery spot that's not your bed. It's good to sit up a lot while you're getting better. A reclining chair might be a good spot. Put things that you often use on a small table near your spot. These may include the TV remote, a cordless phone or your mobile phone, a book, a laptop, and a water glass. Put the things you need on shelves and in drawers that are as high as your kitchen counter. Do this in your kitchen, bathroom, and bedroom.  This way, everything you need will be easy to reach. You may be given a walker to use at home. Check if you have enough room to use it. Try walking around your home with your hands out about 6 inches (15 cm) from your sides. If you don't hit anything while doing this, then you have enough room. Try walking from: Your spot to your kitchen and bathroom. Your bed to the bathroom. Prepare some meals to freeze and reheat later. Make your home safe for recovery     To prevent tripping, clear your floors. Pick up any clutter and throw rugs. Consider getting: Grab bars to put in the shower and near the toilet. A raised toilet seat. This will help you get on and off the toilet more easily. A tub or shower bench. Prepare your body If you smoke, quit as soon as you can. If there's time, it is best to quit several months before surgery. Tell your provider if you use any products that contain nicotine or tobacco. These products include cigarettes, chewing tobacco, and vaping devices, such as e-cigarettes. These can delay healing after surgery. If you need help quitting, ask your provider. Talk to your provider about doing exercises before your surgery. Doing these exercises in the weeks before your surgery may help lessen pain and improve movement in your knee after surgery. Do the exercises given by your provider. Eat a healthy diet. Do not change your diet unless your provider tells you to do that. Do not drink any alcohol for at least 48 hours  before surgery. Plan your recovery In the first few weeks after surgery, doing your usual activities might be tough. You may get tired quickly, and you won't be able to move your leg as much. To make sure you have all the help you need after your surgery: Plan to have a responsible adult take you home from the hospital. You will not be allowed to drive. Your provider will tell you how many days you can expect to be in the hospital. Do not do your normal  tasks and duties for at least 4-6 weeks after surgery. This includes work, caring for others, and volunteering. Plan to have a responsible adult stay with you day and night for the first week. This person should be someone you're comfortable with. You may need this person to help you with your exercises and with personal care, such as bathing and using the toilet. If you live alone, arrange for someone to take care of your home and pets for the first 4-6 weeks after surgery. Arrange for drivers to take you to follow-up visits, the grocery store, and other places you may need to go for at least 4-6 weeks. Consider applying for a disability parking permit. To get an application, call your local department of motor vehicles ([employer]) or your provider's office. This information is not intended to replace advice given to you by your health care provider. Make sure you discuss any questions you have with your health care provider. Document Revised: 05/15/2022 Document Reviewed: 05/15/2022 Elsevier Patient Education  2024 [employer].

## 2023-07-06 NOTE — Progress Notes (Signed)
 Subjective:    Patient ID: Gregory Carey, male    DOB: May 04, 1960, 63 y.o.   MRN: [id number]  Chief Complaint  Patient presents with   surgical clearance    PT presents to the office today for surgical clearance for right total knee replacement. It is scheduled for 08/16/23.   Has already been cleared from Cardiologists.  Arthritis Presents for follow-up visit. He complains of pain and stiffness. Affected locations include the left knee and right knee. His pain is at a severity of 6/10 (can be a 8).  Diabetes He presents for his follow-up diabetic visit. He has type 2 diabetes mellitus. Pertinent negatives for diabetes include no blurred vision and no foot paresthesias. Risk factors for coronary artery disease include dyslipidemia, diabetes mellitus, male sex, hypertension and sedentary lifestyle. He is following a generally healthy diet. His overall blood glucose range is 110-130 mg/dl.  Hypertension This is a chronic problem. The current episode started more than 1 year ago. The problem has been resolved since onset. The problem is controlled. Pertinent negatives include no blurred vision, malaise/fatigue, peripheral edema or shortness of breath. Risk factors for coronary artery disease include dyslipidemia, diabetes mellitus, obesity, male gender and sedentary lifestyle. The current treatment provides moderate improvement.  Hyperlipidemia This is a chronic problem. The current episode started more than 1 year ago. The problem is uncontrolled. Recent lipid tests were reviewed and are high. Exacerbating diseases include obesity. Pertinent negatives include no shortness of breath. Current antihyperlipidemic treatment includes statins. The current treatment provides moderate improvement of lipids. Risk factors for coronary artery disease include dyslipidemia, diabetes mellitus, male sex, hypertension and a sedentary lifestyle.      Review of Systems  Constitutional:  Negative for  malaise/fatigue.  Eyes:  Negative for blurred vision.  Respiratory:  Negative for shortness of breath.   Musculoskeletal:  Positive for arthritis and stiffness.  All other systems reviewed and are negative.   Social History   Socioeconomic History   Marital status: Married    Spouse name: Not on file   Number of children: Not on file   Years of education: Not on file   Highest education level: Associate degree: occupational, Scientist, product/process development, or vocational program  Occupational History   Not on file  Tobacco Use   Smoking status: Never   Smokeless tobacco: Never  Vaping Use   Vaping status: Never Used  Substance and Sexual Activity   Alcohol use: No   Drug use: No   Sexual activity: Yes  Other Topics Concern   Not on file  Social History Narrative   Not on file   Social Drivers of Health   Financial Resource Strain: Low Risk  (04/13/2023)   Overall Financial Resource Strain (CARDIA)    Difficulty of Paying Living Expenses: Not hard at all  Food Insecurity: No Food Insecurity (04/13/2023)   Hunger Vital Sign    Worried About Running Out of Food in the Last Year: Never true    Ran Out of Food in the Last Year: Never true  Transportation Needs: No Transportation Needs (04/13/2023)   PRAPARE - Administrator, Civil Service (Medical): No    Lack of Transportation (Non-Medical): No  Physical Activity: Insufficiently Active (04/13/2023)   Exercise Vital Sign    Days of Exercise per Week: 3 days    Minutes of Exercise per Session: 20 min  Stress: No Stress Concern Present (04/13/2023)   [employer] of Occupational Health - Occupational Stress Questionnaire  Feeling of Stress : Not at all  Social Connections: Moderately Integrated (04/13/2023)   Social Connection and Isolation Panel [NHANES]    Frequency of Communication with Friends and Family: More than three times a week    Frequency of Social Gatherings with Friends and Family: Three times a week    Attends  Religious Services: More than 4 times per year    Active Member of Clubs or Organizations: No    Attends Engineer, structural: Not on file    Marital Status: Married   Family History  Problem Relation Age of Onset   CAD Father    Diabetes Father    Diabetes Paternal Grandmother    Colon cancer Neg Hx    Colon polyps Neg Hx         Objective:   Physical Exam Vitals reviewed.  Constitutional:      General: He is not in acute distress.    Appearance: He is well-developed. He is obese.  HENT:     Head: Normocephalic.     Right Ear: Tympanic membrane normal.     Left Ear: Tympanic membrane normal.  Eyes:     General:        Right eye: No discharge.        Left eye: No discharge.     Pupils: Pupils are equal, round, and reactive to light.  Neck:     Thyroid : No thyromegaly.  Cardiovascular:     Rate and Rhythm: Normal rate and regular rhythm.     Heart sounds: Normal heart sounds. No murmur heard. Pulmonary:     Effort: Pulmonary effort is normal. No respiratory distress.     Breath sounds: Normal breath sounds. No wheezing.  Abdominal:     General: Bowel sounds are normal. There is no distension.     Palpations: Abdomen is soft.     Tenderness: There is no abdominal tenderness.  Musculoskeletal:        General: No tenderness. Normal range of motion.     Cervical back: Normal range of motion and neck supple.     Comments: Full ROM of bilateral knees, pain with extension  Skin:    General: Skin is warm and dry.     Findings: No erythema or rash.  Neurological:     Mental Status: He is alert and oriented to person, place, and time.     Cranial Nerves: No cranial nerve deficit.     Deep Tendon Reflexes: Reflexes are normal and symmetric.  Psychiatric:        Behavior: Behavior normal.        Thought Content: Thought content normal.        Judgment: Judgment normal.       BP 131/78   Pulse 66   Temp (!) 97.2 F (36.2 C) (Temporal)   Ht 5\' 11"  (1.803  m)   Wt 261 lb 6.4 oz (118.6 kg)   BMI 36.46 kg/m      Assessment & Plan:  Gregory Carey comes in today with chief complaint of surgical clearance    Diagnosis and orders addressed:  1. Essential hypertension - CMP14+EGFR - CBC with Differential/Platelet  2. Hyperlipidemia, unspecified hyperlipidemia type - CMP14+EGFR - CBC with Differential/Platelet  3. Morbid obesity (HCC) - CMP14+EGFR - CBC with Differential/Platelet  4. Type 2 diabetes mellitus with other specified complication, with long-term current use of insulin  (HCC) - Bayer DCA Hb A1c Waived - CMP14+EGFR - CBC with Differential/Platelet  5. Primary osteoarthritis of right knee - CMP14+EGFR - CBC with Differential/Platelet  6. Pre-op exam (Primary) - Bayer DCA Hb A1c Waived - CMP14+EGFR - CBC with Differential/Platelet - DG Chest 2 View; Future   Labs pending Continue current medications  Health Maintenance reviewed Keep follow up with Ortho  Tommas Fragmin, FNP

## 2023-07-07 LAB — CBC WITH DIFFERENTIAL/PLATELET
Basophils Absolute: 0.1 10*3/uL (ref 0.0–0.2)
Basos: 1 %
EOS (ABSOLUTE): 0.2 10*3/uL (ref 0.0–0.4)
Eos: 2 %
Hematocrit: 46.1 % (ref 37.5–51.0)
Hemoglobin: 15.7 g/dL (ref 13.0–17.7)
Immature Grans (Abs): 0 10*3/uL (ref 0.0–0.1)
Immature Granulocytes: 0 %
Lymphocytes Absolute: 1.2 10*3/uL (ref 0.7–3.1)
Lymphs: 17 %
MCH: 30.5 pg (ref 26.6–33.0)
MCHC: 34.1 g/dL (ref 31.5–35.7)
MCV: 90 fL (ref 79–97)
Monocytes Absolute: 0.6 10*3/uL (ref 0.1–0.9)
Monocytes: 8 %
Neutrophils Absolute: 5.1 10*3/uL (ref 1.4–7.0)
Neutrophils: 72 %
Platelets: 274 10*3/uL (ref 150–450)
RBC: 5.15 x10E6/uL (ref 4.14–5.80)
RDW: 13.6 % (ref 11.6–15.4)
WBC: 7.1 10*3/uL (ref 3.4–10.8)

## 2023-07-07 LAB — CMP14+EGFR
ALT: 25 IU/L (ref 0–44)
AST: 19 IU/L (ref 0–40)
Albumin: 4.5 g/dL (ref 3.9–4.9)
Alkaline Phosphatase: 61 IU/L (ref 44–121)
BUN/Creatinine Ratio: 20 (ref 10–24)
BUN: 20 mg/dL (ref 8–27)
Bilirubin Total: 0.6 mg/dL (ref 0.0–1.2)
CO2: 22 mmol/L (ref 20–29)
Calcium: 9.6 mg/dL (ref 8.6–10.2)
Chloride: 107 mmol/L — ABNORMAL HIGH (ref 96–106)
Creatinine, Ser: 0.99 mg/dL (ref 0.76–1.27)
Globulin, Total: 1.9 g/dL (ref 1.5–4.5)
Glucose: 95 mg/dL (ref 70–99)
Potassium: 4 mmol/L (ref 3.5–5.2)
Sodium: 146 mmol/L — ABNORMAL HIGH (ref 134–144)
Total Protein: 6.4 g/dL (ref 6.0–8.5)
eGFR: 87 mL/min/{1.73_m2} (ref >59)

## 2023-07-08 ENCOUNTER — Ambulatory Visit: Payer: Self-pay | Admitting: Family

## 2023-07-14 ENCOUNTER — Ambulatory Visit (INDEPENDENT_AMBULATORY_CARE_PROVIDER_SITE_OTHER)

## 2023-07-14 ENCOUNTER — Encounter: Payer: Self-pay | Admitting: Family Medicine

## 2023-07-14 VITALS — BP 150/84 | HR 54 | Temp 97.5°F | Ht 71.0 in | Wt 265.6 lb

## 2023-07-14 DIAGNOSIS — R0981 Nasal congestion: Secondary | ICD-10-CM

## 2023-07-14 DIAGNOSIS — Z794 Long term (current) use of insulin: Secondary | ICD-10-CM

## 2023-07-14 DIAGNOSIS — J3489 Other specified disorders of nose and nasal sinuses: Secondary | ICD-10-CM | POA: Diagnosis not present

## 2023-07-14 DIAGNOSIS — I10 Essential (primary) hypertension: Secondary | ICD-10-CM | POA: Diagnosis not present

## 2023-07-14 DIAGNOSIS — E1169 Type 2 diabetes mellitus with other specified complication: Secondary | ICD-10-CM | POA: Diagnosis not present

## 2023-07-14 MED ORDER — AZITHROMYCIN 250 MG PO TABS: ORAL_TABLET | ORAL | 0 refills | Status: AC

## 2023-07-14 MED ORDER — PREDNISONE 20 MG PO TABS: 40.0000 mg | ORAL_TABLET | Freq: Every day | ORAL | 0 refills | Status: AC

## 2023-07-14 NOTE — Progress Notes (Signed)
 Subjective:  Patient ID: Gregory Carey, male    DOB: May 30, 1960, 63 y.o.   MRN: [id number]  Patient Care Team: Yevette Hem, FNP as PCP - General (Nurse Practitioner) Vinetta Greening, DO as Consulting Physician (Internal Medicine) Hyland Mailman, MD as Referring Physician (Optometry) Malmfelt, Nancyann Aye, RN as Oncology Nurse Navigator Colie Dawes, MD as Consulting Physician (Radiation Oncology) Murleen Arms, MD as Consulting Physician (Hematology and Oncology) Janita Mellow, MD as Consulting Physician (Otolaryngology)   Chief Complaint:  Cough (Dry cough, sneezing, stuffy nose, head and ears feel stopped up. Since Saturday.)   HPI: Gregory Carey is a 63 y.o. male presenting on 07/14/2023 for Cough (Dry cough, sneezing, stuffy nose, head and ears feel stopped up. Since Saturday.)  Cough   States that symptoms started Saturday morning. He has surgery in June and he would like to be better by then.  Reports congestion, rhinorrhea, dry cough, ear pressure.  Taking mucinex which is helping some. Used nedi pot as well.  Denies fever, shortness of breath, N/V.    Relevant past medical, surgical, family, and social history reviewed and updated as indicated.  Allergies and medications reviewed and updated. Data reviewed: Chart in Epic.   Past Medical History:  Diagnosis Date   Cancer (HCC)    neck   Complication of anesthesia    Diabetes mellitus without complication (HCC)    Difficult intubation    Was infomed by MD Anesth his airway was small   Hyperlipidemia    Hypertension    Localized osteoarthritis of left shoulder    Osteoarthritis of left shoulder 09/29/2018   Sleep apnea    uses CPAP    Past Surgical History:  Procedure Laterality Date   BIOPSY  04/23/2020   Procedure: BIOPSY;  Surgeon: Vinetta Greening, DO;  [facility]: [facility];  Service: Endoscopy;;   COLONOSCOPY WITH PROPOFOL  N/A 04/23/2020   Procedure: COLONOSCOPY WITH PROPOFOL ;   Surgeon: Vinetta Greening, DO;  Location: [facility];  Service: Endoscopy;  Laterality: N/A;  am appt   DIRECT LARYNGOSCOPY N/A 10/23/2020   Procedure: DIRECT LARYNGOSCOPY WITH BIOPSIES;  Surgeon: Janita Mellow, MD;  Location: [facility] OR;  Service: ENT;  Laterality: N/A;   ESOPHAGOSCOPY N/A 10/23/2020   Procedure: ESOPHAGOSCOPY;  Surgeon: Janita Mellow, MD;  Location: [facility] OR;  Service: ENT;  Laterality: N/A;   KNEE ARTHROSCOPY Right 02/16/2006   POLYPECTOMY  04/23/2020   Procedure: POLYPECTOMY;  Surgeon: Vinetta Greening, DO;  Location: [facility];  Service: Endoscopy;;   RADICAL NECK DISSECTION Left 10/23/2020   Procedure: LEFT NECK DISSECTION;  Surgeon: Janita Mellow, MD;  Location: [facility] OR;  Service: ENT;  Laterality: Left;   SHOULDER ARTHROSCOPY Right 02/17/2000   SKIN CANCER EXCISION     Multiple basal cell and squmous cell leisons removed   SKIN SURGERY Left 05/05/2022   TOTAL SHOULDER ARTHROPLASTY Left 09/29/2018   Procedure: TOTAL SHOULDER ARTHROPLASTY;  Surgeon: Janeth Medicus, MD;  Location: [facility] OR;  Service: Orthopedics;  Laterality: Left;  2.5 hrs    Social History   Socioeconomic History   Marital status: Married    Spouse name: Not on file   Number of children: Not on file   Years of education: Not on file   Highest education level: Associate degree: occupational, Scientist, product/process development, or vocational program  Occupational History   Not on file  Tobacco Use   Smoking status: Never   Smokeless tobacco: Never  Vaping Use   Vaping status: Never Used  Substance and Sexual Activity   Alcohol use: No   Drug use: No   Sexual activity: Yes  Other Topics Concern   Not on file  Social History Narrative   Not on file   Social Drivers of Health   Financial Resource Strain: Low Risk  (04/13/2023)   Overall Financial Resource Strain (CARDIA)    Difficulty of Paying Living Expenses: Not hard at all  Food Insecurity: No Food Insecurity (04/13/2023)   Hunger Vital Sign    Worried  About Running Out of Food in the Last Year: Never true    Ran Out of Food in the Last Year: Never true  Transportation Needs: No Transportation Needs (04/13/2023)   PRAPARE - Administrator, Civil Service (Medical): No    Lack of Transportation (Non-Medical): No  Physical Activity: Insufficiently Active (04/13/2023)   Exercise Vital Sign    Days of Exercise per Week: 3 days    Minutes of Exercise per Session: 20 min  Stress: No Stress Concern Present (04/13/2023)   [employer] of Occupational Health - Occupational Stress Questionnaire    Feeling of Stress : Not at all  Social Connections: Moderately Integrated (04/13/2023)   Social Connection and Isolation Panel [NHANES]    Frequency of Communication with Friends and Family: More than three times a week    Frequency of Social Gatherings with Friends and Family: Three times a week    Attends Religious Services: More than 4 times per year    Active Member of Clubs or Organizations: No    Attends Banker Meetings: Not on file    Marital Status: Married  Intimate Partner Violence: Unknown (12/22/2021)   Received from [employer], [facility]   HITS    Physically Hurt: Not on file    Insult or Talk Down To: Not on file    Threaten Physical Harm: Not on file    Scream or Curse: Not on file    Outpatient Encounter Medications as of 07/14/2023  Medication Sig   acetaminophen  (TYLENOL ) 325 MG tablet Take 650 mg by mouth every 6 (six) hours as needed.   blood glucose meter kit and supplies Dispense based on patient and insurance preference. Use up to four times daily as directed. (FOR ICD-9 250.00, 250.01).   busPIRone  (BUSPAR ) 5 MG tablet Take 1 tablet (5 mg total) by mouth 3 (three) times daily.   CONTOUR NEXT TEST test strip USE AS DIRECTED TWICE DAILY   diclofenac (VOLTAREN) 75 MG EC tablet Take 75 mg by mouth 2 (two) times daily.   lisinopril -hydrochlorothiazide  (ZESTORETIC ) 20-12.5 MG tablet Take 2 tablets  by mouth once daily   metFORMIN  (GLUCOPHAGE -XR) 750 MG 24 hr tablet Take 750 mg by mouth daily with breakfast.   rosuvastatin  (CRESTOR ) 20 MG tablet Take 1 tablet by mouth once daily   sodium fluoride  (SODIUM FLUORIDE  5000 PPM) 1.1 % GEL dental gel Place 1 pea-size drop into each tooth space of fluoride  trays once a day at bedtime.  Leave trays in for 5 minutes and then remove.  Spit out excess fluoride , but DO NOT rinse with water , eat or drink for at least 30 minutes after use. (Patient not taking: Reported on 07/14/2023)   No facility-administered encounter medications on file as of 07/14/2023.    No Known Allergies  Review of Systems  Respiratory:  Positive for cough.    As per HPI  Objective:  BP Aaron Aas)  155/82   Pulse (!) 54   Temp (!) 97.5 F (36.4 C)   Ht 5\' 11"  (1.803 m)   Wt 265 lb 9.6 oz (120.5 kg)   SpO2 95%   BMI 37.04 kg/m    Wt Readings from Last 3 Encounters:  07/14/23 265 lb 9.6 oz (120.5 kg)  07/06/23 261 lb 6.4 oz (118.6 kg)  06/28/23 263 lb 6.4 oz (119.5 kg)   Physical Exam Constitutional:      General: He is awake. He is not in acute distress.    Appearance: Normal appearance. He is well-developed and well-groomed. He is ill-appearing. He is not toxic-appearing or diaphoretic.  HENT:     Right Ear: There is impacted cerumen.     Left Ear: There is impacted cerumen.     Nose: Congestion and rhinorrhea present. Rhinorrhea is clear.     Right Sinus: Maxillary sinus tenderness present. No frontal sinus tenderness.     Left Sinus: Maxillary sinus tenderness present. No frontal sinus tenderness.     Mouth/Throat:     Lips: Pink. No lesions.     Pharynx: Posterior oropharyngeal erythema present. No postnasal drip.     Tonsils: No tonsillar exudate or tonsillar abscesses.  Cardiovascular:     Rate and Rhythm: Regular rhythm. Bradycardia present.     Heart sounds: Normal heart sounds.  Pulmonary:     Effort: Pulmonary effort is normal.     Breath sounds:  Examination of the right-upper field reveals decreased breath sounds. Examination of the left-upper field reveals decreased breath sounds. Examination of the right-middle field reveals decreased breath sounds. Examination of the left-middle field reveals decreased breath sounds. Examination of the right-lower field reveals decreased breath sounds. Examination of the left-lower field reveals decreased breath sounds. Decreased breath sounds present.  Lymphadenopathy:     Head:     Right side of head: No submental, submandibular, tonsillar, preauricular or posterior auricular adenopathy.     Left side of head: No submental, submandibular, tonsillar, preauricular or posterior auricular adenopathy.     Cervical:     Right cervical: No superficial cervical adenopathy.    Left cervical: No superficial cervical adenopathy.  Skin:    General: Skin is warm.     Capillary Refill: Capillary refill takes less than 2 seconds.  Neurological:     General: No focal deficit present.     Mental Status: He is alert, oriented to person, place, and time and easily aroused. Mental status is at baseline.  Psychiatric:        Attention and Perception: Attention and perception normal.        Mood and Affect: Mood and affect normal.        Speech: Speech normal.        Behavior: Behavior normal. Behavior is cooperative.        Thought Content: Thought content normal.        Cognition and Memory: Cognition and memory normal.        Judgment: Judgment normal.     Results for orders placed or performed in visit on 07/06/23  Bayer DCA Hb A1c Waived   Collection Time: 07/06/23  3:32 PM  Result Value Ref Range   HB A1C (BAYER DCA - WAIVED) 6.5 (H) 4.8 - 5.6 %  CMP14+EGFR   Collection Time: 07/06/23  3:34 PM  Result Value Ref Range   Glucose 95 70 - 99 mg/dL   BUN 20 8 - 27 mg/dL   Creatinine, Ser  0.99 0.76 - 1.27 mg/dL   eGFR 87 >13 YQ/MVH/8.46   BUN/Creatinine Ratio 20 10 - 24   Sodium 146 (H) 134 - 144 mmol/L    Potassium 4.0 3.5 - 5.2 mmol/L   Chloride 107 (H) 96 - 106 mmol/L   CO2 22 20 - 29 mmol/L   Calcium  9.6 8.6 - 10.2 mg/dL   Total Protein 6.4 6.0 - 8.5 g/dL   Albumin 4.5 3.9 - 4.9 g/dL   Globulin, Total 1.9 1.5 - 4.5 g/dL   Bilirubin Total 0.6 0.0 - 1.2 mg/dL   Alkaline Phosphatase 61 44 - 121 IU/L   AST 19 0 - 40 IU/L   ALT 25 0 - 44 IU/L  CBC with Differential/Platelet   Collection Time: 07/06/23  3:34 PM  Result Value Ref Range   WBC 7.1 3.4 - 10.8 x10E3/uL   RBC 5.15 4.14 - 5.80 x10E6/uL   Hemoglobin 15.7 13.0 - 17.7 g/dL   Hematocrit 96.2 95.2 - 51.0 %   MCV 90 79 - 97 fL   MCH 30.5 26.6 - 33.0 pg   MCHC 34.1 31.5 - 35.7 g/dL   RDW 84.1 32.4 - 40.1 %   Platelets 274 150 - 450 x10E3/uL   Neutrophils 72 Not Estab. %   Lymphs 17 Not Estab. %   Monocytes 8 Not Estab. %   Eos 2 Not Estab. %   Basos 1 Not Estab. %   Neutrophils Absolute 5.1 1.4 - 7.0 x10E3/uL   Lymphocytes Absolute 1.2 0.7 - 3.1 x10E3/uL   Monocytes Absolute 0.6 0.1 - 0.9 x10E3/uL   EOS (ABSOLUTE) 0.2 0.0 - 0.4 x10E3/uL   Basophils Absolute 0.1 0.0 - 0.2 x10E3/uL   Immature Granulocytes 0 Not Estab. %   Immature Grans (Abs) 0.0 0.0 - 0.1 x10E3/uL       04/15/2023    1:43 PM 12/18/2022    1:59 PM 03/31/2022   10:17 AM 12/30/2021    3:44 PM 10/06/2021   10:47 AM  Depression screen PHQ 2/9  Decreased Interest 0 0 0 0 0  Down, Depressed, Hopeless 0 0 0 0 0  PHQ - 2 Score 0 0 0 0 0  Altered sleeping  0 0    Tired, decreased energy  0 0    Change in appetite  0 0    Feeling bad or failure about yourself   0 0    Trouble concentrating  0 0    Moving slowly or fidgety/restless  0 0    Suicidal thoughts  0 0    PHQ-9 Score  0 0    Difficult doing work/chores  Not difficult at all Not difficult at all         12/18/2022    1:59 PM 03/31/2022   10:18 AM 07/30/2020    2:54 PM 07/16/2020    1:01 PM  GAD 7 : Generalized Anxiety Score  Nervous, Anxious, on Edge 0 0 0 0  Control/stop worrying 0 0 0 0   Worry too much - different things 0 0 0 0  Trouble relaxing 0 0 0 0  Restless 0 0 0 0  Easily annoyed or irritable 0 0 0 0  Afraid - awful might happen 0 0 0 0  Total GAD 7 Score 0 0 0 0  Anxiety Difficulty Not difficult at all Not difficult at all Not difficult at all    Pertinent labs & imaging results that were available during my care of the patient  were reviewed by me and considered in my medical decision making.  Assessment & Plan:  Melburn was seen today for cough.  Diagnoses and all orders for this visit:  1. Sinus congestion (Primary) Will start medications as below. Will provide pocked abx and discussed with patient to start if they have worsening symptoms or signs of double worsening. Discussed at home care such as humidifier, saline spray, throat lozenges, increasing hydration, and tylenol  for pain or fever.  - predniSONE  (DELTASONE ) 20 MG tablet; Take 2 tablets (40 mg total) by mouth daily with breakfast for 5 days.  Dispense: 10 tablet; Refill: 0 - azithromycin  (ZITHROMAX ) 250 MG tablet; Take 2 tablets on day 1, then 1 tablet daily on days 2 through 5  Dispense: 6 tablet; Refill: 0  2. Rhinorrhea As above.  - predniSONE  (DELTASONE ) 20 MG tablet; Take 2 tablets (40 mg total) by mouth daily with breakfast for 5 days.  Dispense: 10 tablet; Refill: 0 - azithromycin  (ZITHROMAX ) 250 MG tablet; Take 2 tablets on day 1, then 1 tablet daily on days 2 through 5  Dispense: 6 tablet; Refill: 0  3. Essential hypertension Elevated BP today in office. Discussed with patient monitoring BP at home.  Will review measurements with patient at follow up and determine plan for BP management.   4. Type 2 diabetes mellitus with other specified complication, with long-term current use of insulin  [facility]) Discussed with patient that prednisone  can increase BG and to monitor at home.   Continue all other maintenance medications.  Follow up plan: Return if symptoms worsen or fail to  improve.   Continue healthy lifestyle choices, including diet (rich in fruits, vegetables, and lean proteins, and low in salt and simple carbohydrates) and exercise (at least 30 minutes of moderate physical activity daily).  Written and verbal instructions provided   The above assessment and management plan was discussed with the patient. The patient verbalized understanding of and has agreed to the management plan. Patient is aware to call the clinic if they develop any new symptoms or if symptoms persist or worsen. Patient is aware when to return to the clinic for a follow-up visit. Patient educated on when it is appropriate to go to the emergency department.   Jacqualyn Mates, DNP-FNP [facility] [address] [place], [place] [address] [phone]

## 2023-07-14 NOTE — Patient Instructions (Signed)

## 2023-07-19 DIAGNOSIS — M25561 Pain in right knee: Secondary | ICD-10-CM | POA: Diagnosis not present

## 2023-07-19 DIAGNOSIS — M1711 Unilateral primary osteoarthritis, right knee: Secondary | ICD-10-CM | POA: Diagnosis not present

## 2023-08-16 DIAGNOSIS — M948X6 Other specified disorders of cartilage, lower leg: Secondary | ICD-10-CM | POA: Diagnosis not present

## 2023-08-16 DIAGNOSIS — M1711 Unilateral primary osteoarthritis, right knee: Secondary | ICD-10-CM | POA: Diagnosis not present

## 2023-08-18 DIAGNOSIS — M25561 Pain in right knee: Secondary | ICD-10-CM | POA: Diagnosis not present

## 2023-08-18 DIAGNOSIS — R269 Unspecified abnormalities of gait and mobility: Secondary | ICD-10-CM | POA: Diagnosis not present

## 2023-08-18 DIAGNOSIS — M25661 Stiffness of right knee, not elsewhere classified: Secondary | ICD-10-CM | POA: Diagnosis not present

## 2023-08-23 DIAGNOSIS — M25561 Pain in right knee: Secondary | ICD-10-CM | POA: Diagnosis not present

## 2023-08-23 DIAGNOSIS — R269 Unspecified abnormalities of gait and mobility: Secondary | ICD-10-CM | POA: Diagnosis not present

## 2023-08-23 DIAGNOSIS — M25661 Stiffness of right knee, not elsewhere classified: Secondary | ICD-10-CM | POA: Diagnosis not present

## 2023-08-25 DIAGNOSIS — M25661 Stiffness of right knee, not elsewhere classified: Secondary | ICD-10-CM | POA: Diagnosis not present

## 2023-08-25 DIAGNOSIS — M25561 Pain in right knee: Secondary | ICD-10-CM | POA: Diagnosis not present

## 2023-08-25 DIAGNOSIS — R269 Unspecified abnormalities of gait and mobility: Secondary | ICD-10-CM | POA: Diagnosis not present

## 2023-08-27 DIAGNOSIS — M25562 Pain in left knee: Secondary | ICD-10-CM | POA: Diagnosis not present

## 2023-08-27 DIAGNOSIS — M25661 Stiffness of right knee, not elsewhere classified: Secondary | ICD-10-CM | POA: Diagnosis not present

## 2023-08-27 DIAGNOSIS — R269 Unspecified abnormalities of gait and mobility: Secondary | ICD-10-CM | POA: Diagnosis not present

## 2023-08-30 DIAGNOSIS — M25562 Pain in left knee: Secondary | ICD-10-CM | POA: Diagnosis not present

## 2023-08-30 DIAGNOSIS — R269 Unspecified abnormalities of gait and mobility: Secondary | ICD-10-CM | POA: Diagnosis not present

## 2023-08-30 DIAGNOSIS — M25661 Stiffness of right knee, not elsewhere classified: Secondary | ICD-10-CM | POA: Diagnosis not present

## 2023-09-01 DIAGNOSIS — M25661 Stiffness of right knee, not elsewhere classified: Secondary | ICD-10-CM | POA: Diagnosis not present

## 2023-09-01 DIAGNOSIS — R269 Unspecified abnormalities of gait and mobility: Secondary | ICD-10-CM | POA: Diagnosis not present

## 2023-09-01 DIAGNOSIS — M25562 Pain in left knee: Secondary | ICD-10-CM | POA: Diagnosis not present

## 2023-09-03 DIAGNOSIS — M25661 Stiffness of right knee, not elsewhere classified: Secondary | ICD-10-CM | POA: Diagnosis not present

## 2023-09-03 DIAGNOSIS — M25562 Pain in left knee: Secondary | ICD-10-CM | POA: Diagnosis not present

## 2023-09-03 DIAGNOSIS — R269 Unspecified abnormalities of gait and mobility: Secondary | ICD-10-CM | POA: Diagnosis not present

## 2023-09-04 ENCOUNTER — Other Ambulatory Visit: Payer: Self-pay | Admitting: Family

## 2023-09-04 DIAGNOSIS — Z794 Long term (current) use of insulin: Secondary | ICD-10-CM

## 2023-09-07 DIAGNOSIS — M25561 Pain in right knee: Secondary | ICD-10-CM | POA: Diagnosis not present

## 2023-09-07 DIAGNOSIS — M25661 Stiffness of right knee, not elsewhere classified: Secondary | ICD-10-CM | POA: Diagnosis not present

## 2023-09-07 DIAGNOSIS — R269 Unspecified abnormalities of gait and mobility: Secondary | ICD-10-CM | POA: Diagnosis not present

## 2023-09-08 ENCOUNTER — Encounter: Payer: Self-pay | Admitting: Family

## 2023-09-08 ENCOUNTER — Other Ambulatory Visit: Payer: Self-pay | Admitting: Family

## 2023-09-08 DIAGNOSIS — E785 Hyperlipidemia, unspecified: Secondary | ICD-10-CM

## 2023-09-08 DIAGNOSIS — I10 Essential (primary) hypertension: Secondary | ICD-10-CM

## 2023-09-08 NOTE — Telephone Encounter (Signed)
 LETTER Mailed

## 2023-09-08 NOTE — Telephone Encounter (Signed)
 Christy NTBS for 6 mos FU in Nov RFs sent to pharmacy

## 2023-09-09 DIAGNOSIS — M25562 Pain in left knee: Secondary | ICD-10-CM | POA: Diagnosis not present

## 2023-09-09 DIAGNOSIS — M25661 Stiffness of right knee, not elsewhere classified: Secondary | ICD-10-CM | POA: Diagnosis not present

## 2023-09-09 DIAGNOSIS — R269 Unspecified abnormalities of gait and mobility: Secondary | ICD-10-CM | POA: Diagnosis not present

## 2023-09-13 ENCOUNTER — Telehealth: Payer: Self-pay | Admitting: Family

## 2023-09-13 DIAGNOSIS — F411 Generalized anxiety disorder: Secondary | ICD-10-CM

## 2023-09-13 MED ORDER — METFORMIN HCL ER 750 MG PO TB24: 750.0000 mg | ORAL_TABLET | Freq: Every day | ORAL | 0 refills | Status: DC

## 2023-09-13 MED ORDER — BUSPIRONE HCL 5 MG PO TABS: 5.0000 mg | ORAL_TABLET | Freq: Three times a day (TID) | ORAL | 0 refills | Status: DC

## 2023-09-13 NOTE — Addendum Note (Signed)
 Addended by: Drew Herman, MARY-MARGARET on: 09/13/2023 04:09 PM   Modules accepted: Orders

## 2023-09-13 NOTE — Telephone Encounter (Signed)
  Prescription Request  09/13/2023  What is the name of the medication or equipment? ALL  Have you contacted your pharmacy to request a refill? YES  Which pharmacy would you like this sent to? [facility]  Pt has upcoming appt with PCP on 10/25/23 for med refill but will be out of meds before this appt.

## 2023-09-13 NOTE — Telephone Encounter (Signed)
 Aware Buspirone  sent to pharmacy. Zestoretic  & Rosuvastatin  he picked up, these were refilled on 09/08/23.  Asking about his Metformin , he only has a couple days left. His next OV w/ PCP is 10/25/23 I had denied this last week, request was for 500 mg ER and 750 mg 24 hr is what is listed as a historical med. Lst OV w/ Dr. Francyne has his Metformin  as the 750 mg 24 hr. Pt has the 500 mg ER that he has been taking since last refilled on 06/10/23  Please advise

## 2023-09-14 DIAGNOSIS — M25561 Pain in right knee: Secondary | ICD-10-CM | POA: Diagnosis not present

## 2023-09-14 DIAGNOSIS — M25661 Stiffness of right knee, not elsewhere classified: Secondary | ICD-10-CM | POA: Diagnosis not present

## 2023-09-16 DIAGNOSIS — M25661 Stiffness of right knee, not elsewhere classified: Secondary | ICD-10-CM | POA: Diagnosis not present

## 2023-09-16 DIAGNOSIS — M25561 Pain in right knee: Secondary | ICD-10-CM | POA: Diagnosis not present

## 2023-10-08 ENCOUNTER — Other Ambulatory Visit: Payer: Self-pay | Admitting: Family

## 2023-10-08 DIAGNOSIS — F411 Generalized anxiety disorder: Secondary | ICD-10-CM

## 2023-10-21 ENCOUNTER — Ambulatory Visit: Admitting: Family

## 2023-10-25 ENCOUNTER — Ambulatory Visit: Payer: Self-pay | Admitting: Family

## 2023-10-25 ENCOUNTER — Encounter: Payer: Self-pay | Admitting: Family

## 2023-10-25 ENCOUNTER — Ambulatory Visit (INDEPENDENT_AMBULATORY_CARE_PROVIDER_SITE_OTHER): Admitting: Family

## 2023-10-25 VITALS — BP 128/79 | HR 62 | Temp 97.8°F | Wt 265.0 lb

## 2023-10-25 DIAGNOSIS — E1165 Type 2 diabetes mellitus with hyperglycemia: Secondary | ICD-10-CM | POA: Diagnosis not present

## 2023-10-25 DIAGNOSIS — E785 Hyperlipidemia, unspecified: Secondary | ICD-10-CM

## 2023-10-25 DIAGNOSIS — Z794 Long term (current) use of insulin: Secondary | ICD-10-CM | POA: Diagnosis not present

## 2023-10-25 DIAGNOSIS — M1712 Unilateral primary osteoarthritis, left knee: Secondary | ICD-10-CM

## 2023-10-25 DIAGNOSIS — I10 Essential (primary) hypertension: Secondary | ICD-10-CM

## 2023-10-25 DIAGNOSIS — Z01818 Encounter for other preprocedural examination: Secondary | ICD-10-CM

## 2023-10-25 DIAGNOSIS — G4733 Obstructive sleep apnea (adult) (pediatric): Secondary | ICD-10-CM

## 2023-10-25 DIAGNOSIS — F411 Generalized anxiety disorder: Secondary | ICD-10-CM

## 2023-10-25 LAB — BAYER DCA HB A1C WAIVED: HB A1C (BAYER DCA - WAIVED): 6.3 % — ABNORMAL HIGH (ref 4.8–5.6)

## 2023-10-25 NOTE — Progress Notes (Signed)
 Subjective:    Patient ID: Gregory Carey, male    DOB: [birth year], 63 y.o.   MRN: [id number]  Chief Complaint  Patient presents with   Medication Refill   PT presents to the office today for chronic follow up and surgical clearance for left total knee replacement. It is scheduled for 11/22/23.  Had right knee replaced 08/16/23 and did well.   Has already been cleared from Cardiologists.  Arthritis Presents for follow-up visit. He complains of pain and stiffness. Affected locations include the left knee. His pain is at a severity of 7/10 (can be a 8).  Diabetes He presents for his follow-up diabetic visit. He has type 2 diabetes mellitus. Pertinent negatives for diabetes include no blurred vision and no foot paresthesias. Risk factors for coronary artery disease include dyslipidemia, diabetes mellitus, male sex, hypertension and sedentary lifestyle. He is following a generally healthy diet. (Does not check glucose at home)  Hypertension This is a chronic problem. The current episode started more than 1 year ago. The problem has been resolved since onset. The problem is controlled. Pertinent negatives include no blurred vision, malaise/fatigue, peripheral edema or shortness of breath. Risk factors for coronary artery disease include dyslipidemia, diabetes mellitus, obesity, male gender and sedentary lifestyle. The current treatment provides moderate improvement.  Hyperlipidemia This is a chronic problem. The current episode started more than 1 year ago. The problem is uncontrolled. Recent lipid tests were reviewed and are high. Exacerbating diseases include obesity. Pertinent negatives include no shortness of breath. Current antihyperlipidemic treatment includes statins. The current treatment provides moderate improvement of lipids. Risk factors for coronary artery disease include dyslipidemia, diabetes mellitus, male sex, hypertension and a sedentary lifestyle.      Review of Systems   Constitutional:  Negative for malaise/fatigue.  Eyes:  Negative for blurred vision.  Respiratory:  Negative for shortness of breath.   Musculoskeletal:  Positive for stiffness.  All other systems reviewed and are negative.   Social History   Socioeconomic History   Marital status: Married    Spouse name: Not on file   Number of children: Not on file   Years of education: Not on file   Highest education level: Associate degree: occupational, Scientist, product/process development, or vocational program  Occupational History   Not on file  Tobacco Use   Smoking status: Never   Smokeless tobacco: Never  Vaping Use   Vaping status: Never Used  Substance and Sexual Activity   Alcohol use: No   Drug use: No   Sexual activity: Yes  Other Topics Concern   Not on file  Social History Narrative   Not on file   Social Drivers of Health   Financial Resource Strain: Low Risk  (10/21/2023)   Overall Financial Resource Strain (CARDIA)    Difficulty of Paying Living Expenses: Not hard at all  Food Insecurity: No Food Insecurity (10/21/2023)   Hunger Vital Sign    Worried About Running Out of Food in the Last Year: Never true    Ran Out of Food in the Last Year: Never true  Transportation Needs: No Transportation Needs (10/21/2023)   PRAPARE - Administrator, Civil Service (Medical): No    Lack of Transportation (Non-Medical): No  Physical Activity: Insufficiently Active (10/21/2023)   Exercise Vital Sign    Days of Exercise per Week: 3 days    Minutes of Exercise per Session: 30 min  Stress: No Stress Concern Present (10/21/2023)   [employer] of Occupational  Health - Occupational Stress Questionnaire    Feeling of Stress: Not at all  Social Connections: Moderately Integrated (10/21/2023)   Social Connection and Isolation Panel    Frequency of Communication with Friends and Family: More than three times a week    Frequency of Social Gatherings with Friends and Family: Three times a week    Attends  Religious Services: 1 to 4 times per year    Active Member of Clubs or Organizations: No    Attends Engineer, structural: Not on file    Marital Status: Married   Family History  Problem Relation Age of Onset   CAD Father    Diabetes Father    Diabetes Paternal Grandmother    Colon cancer Neg Hx    Colon polyps Neg Hx         Objective:   Physical Exam Vitals reviewed.  Constitutional:      General: He is not in acute distress.    Appearance: He is well-developed. He is obese.  HENT:     Head: Normocephalic.     Right Ear: Tympanic membrane normal.     Left Ear: Tympanic membrane normal.  Eyes:     General:        Right eye: No discharge.        Left eye: No discharge.     Pupils: Pupils are equal, round, and reactive to light.  Neck:     Thyroid : No thyromegaly.  Cardiovascular:     Rate and Rhythm: Normal rate and regular rhythm.     Heart sounds: Normal heart sounds. No murmur heard. Pulmonary:     Effort: Pulmonary effort is normal. No respiratory distress.     Breath sounds: Normal breath sounds. No wheezing.  Abdominal:     General: Bowel sounds are normal. There is no distension.     Palpations: Abdomen is soft.     Tenderness: There is no abdominal tenderness.  Musculoskeletal:        General: No tenderness. Normal range of motion.     Cervical back: Normal range of motion and neck supple.     Comments: Full ROM of bilateral knees, pain with extension in left  Skin:    General: Skin is warm and dry.     Findings: No erythema or rash.  Neurological:     Mental Status: He is alert and oriented to person, place, and time.     Cranial Nerves: No cranial nerve deficit.     Deep Tendon Reflexes: Reflexes are normal and symmetric.  Psychiatric:        Behavior: Behavior normal.        Thought Content: Thought content normal.        Judgment: Judgment normal.       BP 128/79   Pulse 62   Temp 97.8 F (36.6 C)   Wt 265 lb (120.2 kg)   SpO2  96%   BMI 36.96 kg/m      Assessment & Plan:  Gregory Carey comes in today with chief complaint of Medication Refill   Diagnosis and orders addressed:  1. Preop examination (Primary) - EKG 12-Lead  2. Essential hypertension - CBC with Differential/Platelet - CMP14+EGFR  3. Hyperlipidemia, unspecified hyperlipidemia type - Lipid panel  4. Type 2 diabetes mellitus with hyperglycemia, with long-term current use of insulin  (HCC)  - CBC with Differential/Platelet - Bayer DCA Hb A1c Waived  5. Morbid obesity (HCC)  6. OSA (obstructive sleep  apnea)  7. Primary osteoarthritis of left knee  8. GAD (generalized anxiety disorder)  Labs pending Will sign surgical clearance form once labs return Continue current medications  Health Maintenance reviewed Keep follow up with Ortho  Bari Learn, FNP

## 2023-10-25 NOTE — Patient Instructions (Signed)
Preparing for Knee Replacement Preparing for your knee replacement surgery can make your recovery easier and more comfortable. Talk with your health care provider so you can learn what will happen before, during, and after surgery. Ask questions if there's something you don't understand. Tell a health care provider about: Any allergies you have. All medicines you're taking, including vitamins, herbs, eye drops, creams, and over-the-counter medicines. Any problems you or family members have had with anesthesia. Any bleeding problems you have. Any surgeries you've had. Any medical conditions you have. Whether you're pregnant or may be pregnant. What happens before the procedure? Visit your providers You'll need to have a physical exam. When you go to the exam, bring a list of all the medicines and supplements you take. This includes herbs and vitamins. You may need more tests to check if it's safe for you to have surgery. Visit your dentist. Get your teeth cleaned and checked before the surgery. Germs from your mouth or an open wound can travel to your new joint and infect it. Tell your dentist that you plan to have a knee replacement. Keep all appointments. Know the costs of surgery To find out about the costs, call your insurance provider as soon as you decide to have surgery. Ask questions like: How much of the surgery and hospital stay will be covered? What will be covered for: Medical device? Physical therapy? Care at home? Prepare your home Pick a recovery spot that's not your bed. It's good to sit up a lot while you're getting better. A reclining chair might be a good spot. Put things that you often use on a small table near your spot. These may include the TV remote, a cordless phone or your mobile phone, a book, a laptop, and a water glass. Put the things you need on shelves and in drawers that are as high as your kitchen counter. Do this in your kitchen, bathroom, and bedroom.  This way, everything you need will be easy to reach. You may be given a walker to use at home. Check if you have enough room to use it. Try walking around your home with your hands out about 6 inches (15 cm) from your sides. If you don't hit anything while doing this, then you have enough room. Try walking from: Your spot to your kitchen and bathroom. Your bed to the bathroom. Prepare some meals to freeze and reheat later. Make your home safe for recovery     To prevent tripping, clear your floors. Pick up any clutter and throw rugs. Consider getting: Grab bars to put in the shower and near the toilet. A raised toilet seat. This will help you get on and off the toilet more easily. A tub or shower bench. Prepare your body If you smoke, quit as soon as you can. If there's time, it is best to quit several months before surgery. Tell your provider if you use any products that contain nicotine or tobacco. These products include cigarettes, chewing tobacco, and vaping devices, such as e-cigarettes. These can delay healing after surgery. If you need help quitting, ask your provider. Talk to your provider about doing exercises before your surgery. Doing these exercises in the weeks before your surgery may help lessen pain and improve movement in your knee after surgery. Do the exercises given by your provider. Eat a healthy diet. Do not change your diet unless your provider tells you to do that. Do not drink any alcohol for at least 48 hours  before surgery. Plan your recovery In the first few weeks after surgery, doing your usual activities might be tough. You may get tired quickly, and you won't be able to move your leg as much. To make sure you have all the help you need after your surgery: Plan to have a responsible adult take you home from the hospital. You will not be allowed to drive. Your provider will tell you how many days you can expect to be in the hospital. Do not do your normal  tasks and duties for at least 4-6 weeks after surgery. This includes work, caring for others, and volunteering. Plan to have a responsible adult stay with you day and night for the first week. This person should be someone you're comfortable with. You may need this person to help you with your exercises and with personal care, such as bathing and using the toilet. If you live alone, arrange for someone to take care of your home and pets for the first 4-6 weeks after surgery. Arrange for drivers to take you to follow-up visits, the grocery store, and other places you may need to go for at least 4-6 weeks. Consider applying for a disability parking permit. To get an application, call your local department of motor vehicles ([employer]) or your provider's office. This information is not intended to replace advice given to you by your health care provider. Make sure you discuss any questions you have with your health care provider. Document Revised: 05/15/2022 Document Reviewed: 05/15/2022 Elsevier Patient Education  2024 [employer].

## 2023-10-26 LAB — CMP14+EGFR
ALT: 17 IU/L (ref 0–44)
AST: 15 IU/L (ref 0–40)
Albumin: 4.3 g/dL (ref 3.9–4.9)
Alkaline Phosphatase: 76 IU/L (ref 44–121)
BUN/Creatinine Ratio: 15 (ref 10–24)
BUN: 14 mg/dL (ref 8–27)
Bilirubin Total: 0.5 mg/dL (ref 0.0–1.2)
CO2: 27 mmol/L (ref 20–29)
Calcium: 9.8 mg/dL (ref 8.6–10.2)
Chloride: 101 mmol/L (ref 96–106)
Creatinine, Ser: 0.95 mg/dL (ref 0.76–1.27)
Globulin, Total: 2.1 g/dL (ref 1.5–4.5)
Glucose: 132 mg/dL — ABNORMAL HIGH (ref 70–99)
Potassium: 4.4 mmol/L (ref 3.5–5.2)
Sodium: 142 mmol/L (ref 134–144)
Total Protein: 6.4 g/dL (ref 6.0–8.5)
eGFR: 90 mL/min/1.73 (ref >59)

## 2023-10-26 LAB — LIPID PANEL
Chol/HDL Ratio: 3.7 ratio (ref 0.0–5.0)
Cholesterol, Total: 116 mg/dL (ref 100–199)
HDL: 31 mg/dL — ABNORMAL LOW (ref >39)
LDL Chol Calc (NIH): 55 mg/dL (ref 0–99)
Triglycerides: 182 mg/dL — ABNORMAL HIGH (ref 0–149)
VLDL Cholesterol Cal: 30 mg/dL (ref 5–40)

## 2023-10-26 LAB — CBC WITH DIFFERENTIAL/PLATELET
Basophils Absolute: 0.1 x10E3/uL (ref 0.0–0.2)
Basos: 1 %
EOS (ABSOLUTE): 0.2 x10E3/uL (ref 0.0–0.4)
Eos: 2 %
Hematocrit: 44.2 % (ref 37.5–51.0)
Hemoglobin: 14.5 g/dL (ref 13.0–17.7)
Immature Grans (Abs): 0 x10E3/uL (ref 0.0–0.1)
Immature Granulocytes: 0 %
Lymphocytes Absolute: 1.4 x10E3/uL (ref 0.7–3.1)
Lymphs: 19 %
MCH: 28.9 pg (ref 26.6–33.0)
MCHC: 32.8 g/dL (ref 31.5–35.7)
MCV: 88 fL (ref 79–97)
Monocytes Absolute: 0.5 x10E3/uL (ref 0.1–0.9)
Monocytes: 7 %
Neutrophils Absolute: 5 x10E3/uL (ref 1.4–7.0)
Neutrophils: 71 %
Platelets: 290 x10E3/uL (ref 150–450)
RBC: 5.01 x10E6/uL (ref 4.14–5.80)
RDW: 13.2 % (ref 11.6–15.4)
WBC: 7.1 x10E3/uL (ref 3.4–10.8)

## 2023-10-29 ENCOUNTER — Other Ambulatory Visit: Payer: Self-pay | Admitting: Family

## 2023-10-29 ENCOUNTER — Telehealth: Payer: Self-pay

## 2023-10-29 NOTE — Telephone Encounter (Signed)
 Notes and labs printed and placed w/ surgical clearance form that was faxed today on PCP's desk.

## 2023-10-29 NOTE — Telephone Encounter (Signed)
 Copied from CRM #[id number]. Topic: Medical Record Request - Provider/Facility Request >> Oct 29, 2023  1:57 PM Ivette P wrote: Reason for CRM: Pt called in because the Surgeon from the Pre-op is requesting the recent lab results and office notes needed for the surgery.   Pls fllw up with pt regarding questions.   Dr. Donnice Bacca from [facility].

## 2023-11-03 NOTE — Telephone Encounter (Signed)
 All information faxed to Dr Lyndle office

## 2023-11-22 DIAGNOSIS — M1712 Unilateral primary osteoarthritis, left knee: Secondary | ICD-10-CM | POA: Diagnosis not present

## 2023-11-22 DIAGNOSIS — M25712 Osteophyte, left shoulder: Secondary | ICD-10-CM | POA: Diagnosis not present

## 2023-12-06 ENCOUNTER — Other Ambulatory Visit: Payer: Self-pay | Admitting: Family

## 2023-12-06 DIAGNOSIS — E785 Hyperlipidemia, unspecified: Secondary | ICD-10-CM

## 2023-12-06 DIAGNOSIS — I10 Essential (primary) hypertension: Secondary | ICD-10-CM

## 2023-12-08 ENCOUNTER — Encounter: Payer: Self-pay | Admitting: Family

## 2023-12-08 ENCOUNTER — Other Ambulatory Visit: Payer: Self-pay | Admitting: Nurse Practitioner

## 2023-12-08 NOTE — Telephone Encounter (Signed)
 Christy NTBS in March for 6 mos FU RFs sent to pharmacy

## 2023-12-08 NOTE — Telephone Encounter (Signed)
 Lmtcb to schedule an apt

## 2023-12-09 NOTE — Progress Notes (Signed)
 Gregory Carey                                          MRN: [id number]   12/09/2023   The [facility] Quality Team Specialist reviewed this patient medical record for the purposes of chart review for care gap closure. The following were reviewed: abstraction for care gap closure-controlling blood pressure.    [facility] Quality Team

## 2023-12-17 ENCOUNTER — Ambulatory Visit (INDEPENDENT_AMBULATORY_CARE_PROVIDER_SITE_OTHER): Admitting: Family

## 2023-12-17 ENCOUNTER — Encounter: Payer: Self-pay | Admitting: Family

## 2023-12-17 DIAGNOSIS — F411 Generalized anxiety disorder: Secondary | ICD-10-CM

## 2023-12-17 DIAGNOSIS — E785 Hyperlipidemia, unspecified: Secondary | ICD-10-CM | POA: Diagnosis not present

## 2023-12-17 DIAGNOSIS — I10 Essential (primary) hypertension: Secondary | ICD-10-CM | POA: Diagnosis not present

## 2023-12-17 DIAGNOSIS — Z794 Long term (current) use of insulin: Secondary | ICD-10-CM

## 2023-12-17 DIAGNOSIS — E1169 Type 2 diabetes mellitus with other specified complication: Secondary | ICD-10-CM | POA: Diagnosis not present

## 2023-12-17 NOTE — Progress Notes (Signed)
 Subjective:    Patient ID: Gregory Carey, male    DOB: [birth year], 63 y.o.   MRN: [id number]  Chief Complaint  Patient presents with   Medical Management of Chronic Issues   PT presents to the office today for chronic follow up.   Had right knee replaced 08/16/23 and left 11/22/23  and doing well. Doing PT.   He had tongue cancer and completed chemo and radiation. Followed by them annually.   Followed by Cardiologists as needed.  Arthritis Presents for follow-up visit. He complains of pain and stiffness. Affected locations include the left knee. His pain is at a severity of 5/10.  Diabetes He presents for his follow-up diabetic visit. He has type 2 diabetes mellitus. Pertinent negatives for diabetes include no blurred vision and no foot paresthesias. Risk factors for coronary artery disease include dyslipidemia, diabetes mellitus, male sex, hypertension, sedentary lifestyle and obesity. He is following a generally healthy diet. (Does not check glucose at home)  Hypertension This is a chronic problem. The current episode started more than 1 year ago. The problem has been resolved since onset. The problem is controlled. Pertinent negatives include no blurred vision, malaise/fatigue, peripheral edema or shortness of breath. Risk factors for coronary artery disease include dyslipidemia, diabetes mellitus, obesity, male gender and sedentary lifestyle. The current treatment provides moderate improvement.  Hyperlipidemia This is a chronic problem. The current episode started more than 1 year ago. The problem is controlled. Recent lipid tests were reviewed and are normal. Exacerbating diseases include obesity. Pertinent negatives include no shortness of breath. Current antihyperlipidemic treatment includes statins. The current treatment provides moderate improvement of lipids. Risk factors for coronary artery disease include dyslipidemia, diabetes mellitus, male sex, hypertension, a sedentary  lifestyle and obesity.      Review of Systems  Constitutional:  Negative for malaise/fatigue.  Eyes:  Negative for blurred vision.  Respiratory:  Negative for shortness of breath.   Musculoskeletal:  Positive for stiffness.  All other systems reviewed and are negative.   Social History   Socioeconomic History   Marital status: Married    Spouse name: Not on file   Number of children: Not on file   Years of education: Not on file   Highest education level: Associate degree: occupational, scientist, product/process development, or vocational program  Occupational History   Not on file  Tobacco Use   Smoking status: Never   Smokeless tobacco: Never  Vaping Use   Vaping status: Never Used  Substance and Sexual Activity   Alcohol use: No   Drug use: No   Sexual activity: Yes  Other Topics Concern   Not on file  Social History Narrative   Not on file   Social Drivers of Health   Financial Resource Strain: Low Risk  (12/13/2023)   Overall Financial Resource Strain (CARDIA)    Difficulty of Paying Living Expenses: Not hard at all  Food Insecurity: No Food Insecurity (12/13/2023)   Hunger Vital Sign    Worried About Running Out of Food in the Last Year: Never true    Ran Out of Food in the Last Year: Never true  Transportation Needs: No Transportation Needs (12/13/2023)   PRAPARE - Administrator, Civil Service (Medical): No    Lack of Transportation (Non-Medical): No  Physical Activity: Insufficiently Active (12/13/2023)   Exercise Vital Sign    Days of Exercise per Week: 2 days    Minutes of Exercise per Session: 40 min  Stress: No Stress  Concern Present (12/13/2023)   [employer] of Occupational Health - Occupational Stress Questionnaire    Feeling of Stress: Not at all  Social Connections: Moderately Integrated (12/13/2023)   Social Connection and Isolation Panel    Frequency of Communication with Friends and Family: More than three times a week    Frequency of Social  Gatherings with Friends and Family: More than three times a week    Attends Religious Services: More than 4 times per year    Active Member of [employer] or Organizations: No    Attends Engineer, Structural: Not on file    Marital Status: Married   Family History  Problem Relation Age of Onset   CAD Father    Diabetes Father    Diabetes Paternal Grandmother    Colon cancer Neg Hx    Colon polyps Neg Hx         Objective:   Physical Exam Vitals reviewed.  Constitutional:      General: He is not in acute distress.    Appearance: He is well-developed. He is obese.  HENT:     Head: Normocephalic.     Right Ear: Tympanic membrane normal.     Left Ear: Tympanic membrane normal.  Eyes:     General:        Right eye: No discharge.        Left eye: No discharge.     Pupils: Pupils are equal, round, and reactive to light.  Neck:     Thyroid : No thyromegaly.  Cardiovascular:     Rate and Rhythm: Normal rate and regular rhythm.     Heart sounds: Normal heart sounds. No murmur heard. Pulmonary:     Effort: Pulmonary effort is normal. No respiratory distress.     Breath sounds: Normal breath sounds. No wheezing.  Abdominal:     General: Bowel sounds are normal. There is no distension.     Palpations: Abdomen is soft.     Tenderness: There is no abdominal tenderness.  Musculoskeletal:        General: No tenderness. Normal range of motion.     Cervical back: Normal range of motion and neck supple.     Comments: Full ROM of bilateral knees, mild pain with extension in left  Skin:    General: Skin is warm and dry.     Findings: No erythema or rash.  Neurological:     Mental Status: He is alert and oriented to person, place, and time.     Cranial Nerves: No cranial nerve deficit.     Deep Tendon Reflexes: Reflexes are normal and symmetric.  Psychiatric:        Behavior: Behavior normal.        Thought Content: Thought content normal.        Judgment: Judgment normal.        BP 138/80   Pulse 64   Temp 98.1 F (36.7 C)   Ht 5' 11 (1.803 m)   Wt 262 lb 6.4 oz (119 kg)   SpO2 96%   BMI 36.60 kg/m      Assessment & Plan:  OLAND ARQUETTE comes in today with chief complaint of Medical Management of Chronic Issues   Diagnosis and orders addressed:  1. Morbid obesity (HCC) (Primary)  2. Hyperlipidemia, unspecified hyperlipidemia type  3. Type 2 diabetes mellitus with other specified complication, with long-term current use of insulin  (HCC)  4. Essential hypertension  5. GAD (generalized anxiety disorder)  Labs reviewed from 10/25/23 Continue current medications  Health Maintenance reviewed Keep follow up with specialists   Bari Learn, FNP

## 2023-12-17 NOTE — Patient Instructions (Signed)
 Insomnia Insomnia is a sleep disorder that makes it difficult to fall asleep or stay asleep. Insomnia can cause fatigue, low energy, difficulty concentrating, mood swings, and poor performance at work or school. There are three different ways to classify insomnia: Difficulty falling asleep. Difficulty staying asleep. Waking up too early in the morning. Any type of insomnia can be long-term (chronic) or short-term (acute). Both are common. Short-term insomnia usually lasts for 3 months or less. Chronic insomnia occurs at least three times a week for longer than 3 months. What are the causes? Insomnia may be caused by another condition, situation, or substance, such as: Having certain mental health conditions, such as anxiety and depression. Using caffeine, alcohol , tobacco, or drugs. Having gastrointestinal conditions, such as gastroesophageal reflux disease (GERD). Having certain medical conditions. These include: Asthma. Alzheimer's disease. Stroke. Chronic pain. An overactive thyroid  gland (hyperthyroidism). Other sleep disorders, such as restless legs syndrome and sleep apnea. Menopause. Sometimes, the cause of insomnia may not be known. What increases the risk? Risk factors for insomnia include: Gender. Females are affected more often than males. Age. Insomnia is more common as people get older. Stress and certain medical and mental health conditions. Lack of exercise. Having an irregular work schedule. This may include working night shifts and traveling between different time zones. What are the signs or symptoms? If you have insomnia, the main symptom is having trouble falling asleep or having trouble staying asleep. This may lead to other symptoms, such as: Feeling tired or having low energy. Feeling nervous about going to sleep. Not feeling rested in the morning. Having trouble concentrating. Feeling irritable, anxious, or depressed. How is this diagnosed? This condition  may be diagnosed based on: Your symptoms and medical history. Your health care provider may ask about: Your sleep habits. Any medical conditions you have. Your mental health. A physical exam. How is this treated? Treatment for insomnia depends on the cause. Treatment may focus on treating an underlying condition that is causing the insomnia. Treatment may also include: Medicines to help you sleep. Counseling or therapy. Lifestyle adjustments to help you sleep better. Follow these instructions at home: Eating and drinking  Limit or avoid alcohol , caffeinated beverages, and products that contain nicotine and tobacco, especially close to bedtime. These can disrupt your sleep. Do not eat a large meal or eat spicy foods right before bedtime. This can lead to digestive discomfort that can make it hard for you to sleep. Sleep habits  Keep a sleep diary to help you and your health care provider figure out what could be causing your insomnia. Write down: When you sleep. When you wake up during the night. How well you sleep and how rested you feel the next day. Any side effects of medicines you are taking. What you eat and drink. Make your bedroom a dark, comfortable place where it is easy to fall asleep. Put up shades or blackout curtains to block light from outside. Use a white noise machine to block noise. Keep the temperature cool. Limit screen use before bedtime. This includes: Not watching TV. Not using your smartphone, tablet, or computer. Stick to a routine that includes going to bed and waking up at the same times every day and night. This can help you fall asleep faster. Consider making a quiet activity, such as reading, part of your nighttime routine. Try to avoid taking naps during the day so that you sleep better at night. Get out of bed if you are still awake after  15 minutes of trying to sleep. Keep the lights down, but try reading or doing a quiet activity. When you feel  sleepy, go back to bed. General instructions Take over-the-counter and prescription medicines only as told by your health care provider. Exercise regularly as told by your health care provider. However, avoid exercising in the hours right before bedtime. Use relaxation techniques to manage stress. Ask your health care provider to suggest some techniques that may work well for you. These may include: Breathing exercises. Routines to release muscle tension. Visualizing peaceful scenes. Make sure that you drive carefully. Do not drive if you feel very sleepy. Keep all follow-up visits. This is important. Contact a health care provider if: You are tired throughout the day. You have trouble in your daily routine due to sleepiness. You continue to have sleep problems, or your sleep problems get worse. Get help right away if: You have thoughts about hurting yourself or someone else. Get help right away if you feel like you may hurt yourself or others, or have thoughts about taking your own life. Go to your nearest emergency room or: Call 911. Call the National Suicide Prevention Lifeline at 2232757840 or 988. This is open 24 hours a day. Text the Crisis Text Line at 657-529-4371. Summary Insomnia is a sleep disorder that makes it difficult to fall asleep or stay asleep. Insomnia can be long-term (chronic) or short-term (acute). Treatment for insomnia depends on the cause. Treatment may focus on treating an underlying condition that is causing the insomnia. Keep a sleep diary to help you and your health care provider figure out what could be causing your insomnia. This information is not intended to replace advice given to you by your health care provider. Make sure you discuss any questions you have with your health care provider. Document Revised: 01/13/2021 Document Reviewed: 01/13/2021 Elsevier Patient Education  2024 [employer].

## 2024-01-25 ENCOUNTER — Ambulatory Visit: Admitting: Family

## 2024-01-25 ENCOUNTER — Encounter: Payer: Self-pay | Admitting: Family

## 2024-01-25 VITALS — BP 121/81 | HR 75 | Temp 97.6°F | Ht 71.0 in | Wt 265.4 lb

## 2024-01-25 DIAGNOSIS — U071 COVID-19: Secondary | ICD-10-CM | POA: Diagnosis not present

## 2024-01-25 DIAGNOSIS — R6889 Other general symptoms and signs: Secondary | ICD-10-CM | POA: Diagnosis not present

## 2024-01-25 LAB — VERITOR SARS-COV-2 AND FLU A+B
BD Veritor SARS-CoV-2 Ag: POSITIVE — AB
Influenza A: NEGATIVE
Influenza B: NEGATIVE

## 2024-01-25 MED ORDER — BENZONATATE 200 MG PO CAPS: 200.0000 mg | ORAL_CAPSULE | Freq: Two times a day (BID) | ORAL | 0 refills | Status: DC | PRN

## 2024-01-25 MED ORDER — PROMETHAZINE-DM 6.25-15 MG/5ML PO SYRP: 5.0000 mL | ORAL_SOLUTION | Freq: Three times a day (TID) | ORAL | 0 refills | Status: DC | PRN

## 2024-01-25 MED ORDER — NIRMATRELVIR/RITONAVIR (PAXLOVID)TABLET: 3.0000 | ORAL_TABLET | Freq: Two times a day (BID) | ORAL | 0 refills | Status: AC

## 2024-01-25 NOTE — Patient Instructions (Signed)
 COVID-19: What to Know COVID-19 is an infection caused by a virus called SARS-CoV-2. This type of virus is called a coronavirus. People with COVID-19 may: Have few to no symptoms. Have mild to moderate symptoms that affect their lungs and breathing. Get very sick. What are the causes?  COVID-19 is caused by a virus. This virus may be in the air as droplets or on surfaces. It can spread from an infected person when they cough, sneeze, speak, sing, or breathe. You may become infected if: You breathe in the infected droplets in the air. You touch an object that has the virus on it. What increases the risk? You are at risk of getting COVID-19 if you have been around someone with the infection. You may be more likely to get very sick if: You are 57 years old or older. You have certain medical conditions, such as: Heart disease. Diabetes. Long-term respiratory disease. Cancer. Pregnancy. You are immunocompromised. This means your body can't fight infections easily. You have a disability that makes it hard for you to move around, you have trouble moving, or you can't move at all. What are the signs or symptoms? People may have different symptoms from COVID-19. The symptoms can also be mild to very bad. They often show up in 5-6 days after being infected. But, they can take up to 14 days to appear. Common symptoms are: Cough. Feeling tired. New loss of taste or smell. Fever. Less common symptoms are: Sore throat. Headache. Body or muscle aches. Diarrhea. A skin rash or fingers or toes that are a different color than usual. Red or irritated eyes. Sometimes, COVID-19 does not cause symptoms. How is this diagnosed? COVID-19 can be diagnosed with tests done in the lab or at home. Fluid from your nose, mouth, or lungs will be used to check for the virus. How is this treated? Treatment for COVID-19 depends on how sick you are. Mild symptoms can be treated at home with rest, fluids, and  over-the-counter medicines. very bad symptoms may be treated in a hospital intensive care unit (ICU). If you have symptoms and are at risk of getting very sick, you may be given a medicine that fights viruses. This medicine is called an antiviral. How is this prevented? To protect yourself from COVID-19: Know your risk factors. Get vaccinated. If your body can't fight infections easily, talk to your provider about treatment to help prevent COVID-19. Stay at least about 3 feet (1 meter) away from other people. Wear mask that fits well when: You can't stay at a distance from people. You're in a place with not a lot of air flow. Try to be in open spaces with good air flow when you are in public. Wash your hands often or use an alcohol-based hand sanitizer. Cover your nose and mouth when you cough or sneeze. If you think you have COVID-19 or have been around someone who has it, stay home and away from other people as told by your provider or health officials. Where to find more information To learn more: Go to [employer] Click Health Topics. Type COVID-19 in the search box. Go to [employer]ination.[employer].br Click Health Topics. Then click All Topics. Type COVID-19 in the search box. Get help right away if: You have trouble breathing or get short of breath. You have pain or pressure in your chest. You're feeling confused. These symptoms may be an emergency. Get help right away. Call 911. Do not wait to see if the symptoms will go away.  Do not drive yourself to the hospital. This information is not intended to replace advice given to you by your health care provider. Make sure you discuss any questions you have with your health care provider. Document Revised: 11/05/2022 Document Reviewed: 10/28/2022 Elsevier Patient Education  2025 [employer].

## 2024-01-25 NOTE — Progress Notes (Signed)
 Subjective:    Patient ID: Gregory Carey, male    DOB: 11-05-1960, 63 y.o.   MRN: [id number]  Chief Complaint  Patient presents with   flu like s/s    Started yesterday and fever cough    PT presents to the office today with cough and fever that started yesterday.  Cough This is a new problem. The current episode started yesterday. The problem has been unchanged. The problem occurs constantly. The cough is Non-productive. Associated symptoms include a fever, headaches, myalgias, nasal congestion, postnasal drip and shortness of breath. Pertinent negatives include no chills, ear congestion, ear pain or wheezing. He has tried rest and OTC cough suppressant for the symptoms. The treatment provided mild relief.      Review of Systems  Constitutional:  Positive for fever. Negative for chills.  HENT:  Positive for postnasal drip. Negative for ear pain.   Respiratory:  Positive for cough and shortness of breath. Negative for wheezing.   Musculoskeletal:  Positive for myalgias.  Neurological:  Positive for headaches.  All other systems reviewed and are negative.   Social History   Socioeconomic History   Marital status: Married    Spouse name: Not on file   Number of children: Not on file   Years of education: Not on file   Highest education level: Associate degree: occupational, scientist, product/process development, or vocational program  Occupational History   Not on file  Tobacco Use   Smoking status: Never   Smokeless tobacco: Never  Vaping Use   Vaping status: Never Used  Substance and Sexual Activity   Alcohol use: No   Drug use: No   Sexual activity: Yes  Other Topics Concern   Not on file  Social History Narrative   Not on file   Social Drivers of Health   Financial Resource Strain: Low Risk  (12/13/2023)   Overall Financial Resource Strain (CARDIA)    Difficulty of Paying Living Expenses: Not hard at all  Food Insecurity: No Food Insecurity (12/13/2023)   Hunger Vital Sign     Worried About Running Out of Food in the Last Year: Never true    Ran Out of Food in the Last Year: Never true  Transportation Needs: No Transportation Needs (12/13/2023)   PRAPARE - Administrator, Civil Service (Medical): No    Lack of Transportation (Non-Medical): No  Physical Activity: Insufficiently Active (12/13/2023)   Exercise Vital Sign    Days of Exercise per Week: 2 days    Minutes of Exercise per Session: 40 min  Stress: No Stress Concern Present (12/13/2023)   [employer] of Occupational Health - Occupational Stress Questionnaire    Feeling of Stress: Not at all  Social Connections: Moderately Integrated (12/13/2023)   Social Connection and Isolation Panel    Frequency of Communication with Friends and Family: More than three times a week    Frequency of Social Gatherings with Friends and Family: More than three times a week    Attends Religious Services: More than 4 times per year    Active Member of [employer] or Organizations: No    Attends Engineer, Structural: Not on file    Marital Status: Married   Family History  Problem Relation Age of Onset   CAD Father    Diabetes Father    Diabetes Paternal Grandmother    Colon cancer Neg Hx    Colon polyps Neg Hx         Objective:  Physical Exam Vitals reviewed.  Constitutional:      General: He is not in acute distress.    Appearance: He is well-developed.  HENT:     Head: Normocephalic.     Right Ear: Tympanic membrane normal.     Left Ear: Tympanic membrane normal.  Eyes:     General:        Right eye: No discharge.        Left eye: No discharge.     Pupils: Pupils are equal, round, and reactive to light.  Neck:     Thyroid : No thyromegaly.  Cardiovascular:     Rate and Rhythm: Normal rate and regular rhythm.     Heart sounds: Normal heart sounds. No murmur heard. Pulmonary:     Effort: Pulmonary effort is normal. No respiratory distress.     Breath sounds: Normal breath  sounds. No wheezing.  Abdominal:     General: Bowel sounds are normal. There is no distension.     Palpations: Abdomen is soft.     Tenderness: There is no abdominal tenderness.  Musculoskeletal:        General: No tenderness. Normal range of motion.     Cervical back: Normal range of motion and neck supple.  Skin:    General: Skin is warm and dry.     Findings: No erythema or rash.  Neurological:     Mental Status: He is alert and oriented to person, place, and time.     Cranial Nerves: No cranial nerve deficit.     Deep Tendon Reflexes: Reflexes are normal and symmetric.  Psychiatric:        Behavior: Behavior normal.        Thought Content: Thought content normal.        Judgment: Judgment normal.       BP 121/81   Pulse 75   Temp 97.6 F (36.4 C) (Temporal)   Ht 5' 11 (1.803 m)   Wt 265 lb 6.4 oz (120.4 kg)   SpO2 96%   BMI 37.02 kg/m      Assessment & Plan:  IVAR DOMANGUE comes in today with chief complaint of flu like s/s (Started yesterday and fever cough )   Diagnosis and orders addressed:  1. Flu-like symptoms - Veritor SARS-CoV-2 and Flu A+B  2. COVID-19 (Primary) COVID positive, rest, force fluids, tylenol  as needed, report any worsening symptoms such as increased shortness of breath, swelling, or continued high fevers. Possible adverse effects discussed with antivirals.  - nirmatrelvir /ritonavir  (PAXLOVID ) 20 x 150 MG & 10 x 100MG  TABS; Take 3 tablets by mouth 2 (two) times daily for 5 days. (Take nirmatrelvir  150 mg two tablets twice daily for 5 days and ritonavir  100 mg one tablet twice daily for 5 days) Patient GFR is 90  Dispense: 30 tablet; Refill: 0 - benzonatate  (TESSALON ) 200 MG capsule; Take 1 capsule (200 mg total) by mouth 2 (two) times daily as needed for cough.  Dispense: 20 capsule; Refill: 0 - promethazine -dextromethorphan (PROMETHAZINE -DM) 6.25-15 MG/5ML syrup; Take 5 mLs by mouth 3 (three) times daily as needed for cough.  Dispense: 118  mL; Refill: 0       Bari Learn, FNP

## 2024-01-27 ENCOUNTER — Ambulatory Visit: Payer: Self-pay | Admitting: Family

## 2024-03-05 ENCOUNTER — Other Ambulatory Visit: Payer: Self-pay | Admitting: Family

## 2024-03-05 DIAGNOSIS — I10 Essential (primary) hypertension: Secondary | ICD-10-CM

## 2024-03-05 DIAGNOSIS — E785 Hyperlipidemia, unspecified: Secondary | ICD-10-CM

## 2024-03-06 NOTE — Telephone Encounter (Signed)
 Appt scheduled 06/15/2024

## 2024-03-06 NOTE — Telephone Encounter (Signed)
 Christy NTBS in April for 6 mos FU RFs sent to pharmacy

## 2024-03-09 ENCOUNTER — Other Ambulatory Visit: Payer: Self-pay | Admitting: Family

## 2024-03-09 MED ORDER — BENZONATATE 200 MG PO CAPS: 200.0000 mg | ORAL_CAPSULE | Freq: Three times a day (TID) | ORAL | 0 refills | Status: AC | PRN

## 2024-03-09 MED ORDER — PROMETHAZINE-DM 6.25-15 MG/5ML PO SYRP: 5.0000 mL | ORAL_SOLUTION | Freq: Three times a day (TID) | ORAL | 0 refills | Status: AC | PRN

## 2024-03-09 NOTE — Progress Notes (Signed)
 PT calls today. His wife is in the hospital with RSV. Having coughing. Cough medication Prescription sent to pharmacy

## 2024-06-15 ENCOUNTER — Ambulatory Visit: Admitting: Family
